# Patient Record
Sex: Female | Born: 1955 | Race: White | Hispanic: No | Marital: Married | Smoking: Never smoker
Health system: Southern US, Community
[De-identification: ages and names within clinical notes are randomized; demographics above are authoritative.]

## PROBLEM LIST (undated history)

## (undated) DIAGNOSIS — D696 Thrombocytopenia, unspecified: Secondary | ICD-10-CM

## (undated) DIAGNOSIS — N898 Other specified noninflammatory disorders of vagina: Secondary | ICD-10-CM

## (undated) DIAGNOSIS — K648 Other hemorrhoids: Secondary | ICD-10-CM

## (undated) DIAGNOSIS — N3281 Overactive bladder: Secondary | ICD-10-CM

## (undated) DIAGNOSIS — B370 Candidal stomatitis: Secondary | ICD-10-CM

## (undated) DIAGNOSIS — R1032 Left lower quadrant pain: Secondary | ICD-10-CM

## (undated) DIAGNOSIS — N760 Acute vaginitis: Secondary | ICD-10-CM

## (undated) DIAGNOSIS — I10 Essential (primary) hypertension: Secondary | ICD-10-CM

## (undated) DIAGNOSIS — G4700 Insomnia, unspecified: Secondary | ICD-10-CM

## (undated) DIAGNOSIS — F419 Anxiety disorder, unspecified: Secondary | ICD-10-CM

## (undated) DIAGNOSIS — E785 Hyperlipidemia, unspecified: Secondary | ICD-10-CM

## (undated) DIAGNOSIS — I639 Cerebral infarction, unspecified: Secondary | ICD-10-CM

## (undated) DIAGNOSIS — J302 Other seasonal allergic rhinitis: Secondary | ICD-10-CM

## (undated) DIAGNOSIS — E039 Hypothyroidism, unspecified: Secondary | ICD-10-CM

## (undated) DIAGNOSIS — I2510 Atherosclerotic heart disease of native coronary artery without angina pectoris: Secondary | ICD-10-CM

## (undated) DIAGNOSIS — B9689 Other specified bacterial agents as the cause of diseases classified elsewhere: Secondary | ICD-10-CM

## (undated) DIAGNOSIS — D369 Benign neoplasm, unspecified site: Secondary | ICD-10-CM

## (undated) DIAGNOSIS — F439 Reaction to severe stress, unspecified: Secondary | ICD-10-CM

## (undated) HISTORY — DX: Other specified noninflammatory disorders of vagina: N89.8

## (undated) HISTORY — PX: ABDOMINAL HYSTERECTOMY: SHX81

## (undated) HISTORY — DX: Insomnia, unspecified: G47.00

## (undated) HISTORY — DX: Acute vaginitis: N76.0

## (undated) HISTORY — PX: INNER EAR SURGERY: SHX679

## (undated) HISTORY — PX: APPENDECTOMY: SHX54

## (undated) HISTORY — DX: Other specified bacterial agents as the cause of diseases classified elsewhere: B96.89

## (undated) HISTORY — DX: Left lower quadrant pain: R10.32

## (undated) HISTORY — DX: Cerebral infarction, unspecified: I63.9

## (undated) HISTORY — DX: Candidal stomatitis: B37.0

## (undated) HISTORY — DX: Benign neoplasm, unspecified site: D36.9

## (undated) HISTORY — DX: Reaction to severe stress, unspecified: F43.9

## (undated) HISTORY — PX: TOTAL ABDOMINAL HYSTERECTOMY W/ BILATERAL SALPINGOOPHORECTOMY: SHX83

---

## 2000-10-13 ENCOUNTER — Ambulatory Visit (HOSPITAL_COMMUNITY): Admission: RE | Admit: 2000-10-13 | Discharge: 2000-10-13 | Payer: Self-pay | Admitting: Obstetrics and Gynecology

## 2000-10-13 ENCOUNTER — Encounter: Payer: Self-pay | Admitting: Obstetrics and Gynecology

## 2001-01-05 ENCOUNTER — Ambulatory Visit (HOSPITAL_COMMUNITY): Admission: RE | Admit: 2001-01-05 | Discharge: 2001-01-05 | Payer: Self-pay | Admitting: Obstetrics and Gynecology

## 2001-01-05 ENCOUNTER — Encounter: Payer: Self-pay | Admitting: Obstetrics and Gynecology

## 2001-02-27 DIAGNOSIS — I2510 Atherosclerotic heart disease of native coronary artery without angina pectoris: Secondary | ICD-10-CM

## 2001-02-27 HISTORY — PX: CARDIAC CATHETERIZATION: SHX172

## 2001-02-27 HISTORY — DX: Atherosclerotic heart disease of native coronary artery without angina pectoris: I25.10

## 2001-03-03 ENCOUNTER — Inpatient Hospital Stay (HOSPITAL_COMMUNITY): Admission: EM | Admit: 2001-03-03 | Discharge: 2001-03-04 | Payer: Self-pay | Admitting: Emergency Medicine

## 2001-03-03 ENCOUNTER — Encounter: Payer: Self-pay | Admitting: Emergency Medicine

## 2001-03-10 ENCOUNTER — Ambulatory Visit (HOSPITAL_COMMUNITY): Admission: RE | Admit: 2001-03-10 | Discharge: 2001-03-10 | Payer: Self-pay | Admitting: Cardiology

## 2001-09-05 ENCOUNTER — Other Ambulatory Visit: Admission: RE | Admit: 2001-09-05 | Discharge: 2001-09-05 | Payer: Self-pay | Admitting: Obstetrics and Gynecology

## 2001-09-09 ENCOUNTER — Ambulatory Visit (HOSPITAL_COMMUNITY): Admission: RE | Admit: 2001-09-09 | Discharge: 2001-09-09 | Payer: Self-pay | Admitting: Obstetrics and Gynecology

## 2001-09-09 ENCOUNTER — Encounter: Payer: Self-pay | Admitting: Obstetrics and Gynecology

## 2001-09-30 ENCOUNTER — Inpatient Hospital Stay (HOSPITAL_COMMUNITY): Admission: RE | Admit: 2001-09-30 | Discharge: 2001-10-02 | Payer: Self-pay | Admitting: Obstetrics and Gynecology

## 2002-08-28 ENCOUNTER — Encounter: Payer: Self-pay | Admitting: Emergency Medicine

## 2002-08-28 ENCOUNTER — Emergency Department (HOSPITAL_COMMUNITY): Admission: EM | Admit: 2002-08-28 | Discharge: 2002-08-28 | Payer: Self-pay | Admitting: Emergency Medicine

## 2002-11-28 HISTORY — PX: LAPAROTOMY: SHX154

## 2002-12-06 ENCOUNTER — Encounter: Payer: Self-pay | Admitting: Obstetrics & Gynecology

## 2002-12-06 ENCOUNTER — Ambulatory Visit (HOSPITAL_COMMUNITY): Admission: RE | Admit: 2002-12-06 | Discharge: 2002-12-06 | Payer: Self-pay | Admitting: Obstetrics & Gynecology

## 2002-12-11 ENCOUNTER — Inpatient Hospital Stay (HOSPITAL_COMMUNITY): Admission: RE | Admit: 2002-12-11 | Discharge: 2002-12-14 | Payer: Self-pay | Admitting: Obstetrics and Gynecology

## 2003-09-28 DIAGNOSIS — K648 Other hemorrhoids: Secondary | ICD-10-CM

## 2003-09-28 HISTORY — DX: Other hemorrhoids: K64.8

## 2003-09-28 HISTORY — PX: COLONOSCOPY: SHX174

## 2003-10-16 ENCOUNTER — Ambulatory Visit (HOSPITAL_COMMUNITY): Admission: RE | Admit: 2003-10-16 | Discharge: 2003-10-16 | Payer: Self-pay | Admitting: Internal Medicine

## 2003-10-19 ENCOUNTER — Ambulatory Visit (HOSPITAL_COMMUNITY): Admission: RE | Admit: 2003-10-19 | Discharge: 2003-10-19 | Payer: Self-pay | Admitting: Internal Medicine

## 2005-10-26 ENCOUNTER — Ambulatory Visit (HOSPITAL_COMMUNITY): Admission: RE | Admit: 2005-10-26 | Discharge: 2005-10-26 | Payer: Self-pay | Admitting: Family Medicine

## 2006-08-30 ENCOUNTER — Ambulatory Visit (HOSPITAL_COMMUNITY): Admission: RE | Admit: 2006-08-30 | Discharge: 2006-08-30 | Payer: Self-pay | Admitting: Obstetrics and Gynecology

## 2006-09-27 ENCOUNTER — Encounter (HOSPITAL_COMMUNITY): Admission: RE | Admit: 2006-09-27 | Discharge: 2006-10-27 | Payer: Self-pay | Admitting: Oncology

## 2006-09-27 ENCOUNTER — Ambulatory Visit (HOSPITAL_COMMUNITY): Payer: Self-pay | Admitting: Oncology

## 2006-10-28 DIAGNOSIS — E039 Hypothyroidism, unspecified: Secondary | ICD-10-CM

## 2006-10-28 HISTORY — DX: Hypothyroidism, unspecified: E03.9

## 2006-10-28 HISTORY — PX: THYROIDECTOMY: SHX17

## 2006-11-09 ENCOUNTER — Encounter (HOSPITAL_COMMUNITY): Admission: RE | Admit: 2006-11-09 | Discharge: 2006-12-09 | Payer: Self-pay | Admitting: Oncology

## 2006-11-24 ENCOUNTER — Encounter (INDEPENDENT_AMBULATORY_CARE_PROVIDER_SITE_OTHER): Payer: Self-pay | Admitting: General Surgery

## 2006-11-24 ENCOUNTER — Observation Stay (HOSPITAL_COMMUNITY): Admission: AD | Admit: 2006-11-24 | Discharge: 2006-11-25 | Payer: Self-pay | Admitting: General Surgery

## 2007-09-02 ENCOUNTER — Ambulatory Visit (HOSPITAL_COMMUNITY): Admission: RE | Admit: 2007-09-02 | Discharge: 2007-09-02 | Payer: Self-pay | Admitting: Family Medicine

## 2007-09-09 ENCOUNTER — Other Ambulatory Visit: Admission: RE | Admit: 2007-09-09 | Discharge: 2007-09-09 | Payer: Self-pay | Admitting: Obstetrics and Gynecology

## 2008-10-03 ENCOUNTER — Ambulatory Visit (HOSPITAL_COMMUNITY): Admission: RE | Admit: 2008-10-03 | Discharge: 2008-10-03 | Payer: Self-pay | Admitting: Obstetrics & Gynecology

## 2009-12-10 ENCOUNTER — Ambulatory Visit (HOSPITAL_COMMUNITY): Payer: Self-pay | Admitting: Oncology

## 2009-12-10 ENCOUNTER — Encounter (HOSPITAL_COMMUNITY): Admission: RE | Admit: 2009-12-10 | Discharge: 2010-01-09 | Payer: Self-pay | Admitting: Oncology

## 2009-12-10 ENCOUNTER — Ambulatory Visit: Payer: Self-pay | Admitting: Oncology

## 2010-02-10 ENCOUNTER — Ambulatory Visit (HOSPITAL_COMMUNITY): Payer: Self-pay | Admitting: Oncology

## 2010-02-10 ENCOUNTER — Encounter (HOSPITAL_COMMUNITY): Admission: RE | Admit: 2010-02-10 | Discharge: 2010-03-12 | Payer: Self-pay | Admitting: Oncology

## 2010-07-20 ENCOUNTER — Encounter: Payer: Self-pay | Admitting: Family Medicine

## 2010-08-11 ENCOUNTER — Other Ambulatory Visit (HOSPITAL_COMMUNITY): Payer: Self-pay

## 2010-09-11 LAB — DIFFERENTIAL
Basophils Absolute: 0.1 10*3/uL (ref 0.0–0.1)
Basophils Relative: 1 % (ref 0–1)
Eosinophils Absolute: 0.2 10*3/uL (ref 0.0–0.7)
Eosinophils Relative: 3 % (ref 0–5)
Lymphocytes Relative: 39 % (ref 12–46)
Lymphs Abs: 3 10*3/uL (ref 0.7–4.0)
Monocytes Absolute: 0.7 10*3/uL (ref 0.1–1.0)
Monocytes Relative: 9 % (ref 3–12)
Neutro Abs: 3.8 10*3/uL (ref 1.7–7.7)
Neutrophils Relative %: 48 % (ref 43–77)

## 2010-09-11 LAB — CBC
HCT: 40.3 % (ref 36.0–46.0)
Hemoglobin: 13.7 g/dL (ref 12.0–15.0)
MCH: 31.5 pg (ref 26.0–34.0)
MCHC: 33.9 g/dL (ref 30.0–36.0)
MCV: 93 fL (ref 78.0–100.0)
RBC: 4.34 MIL/uL (ref 3.87–5.11)
RDW: 12.6 % (ref 11.5–15.5)
WBC: 7.8 10*3/uL (ref 4.0–10.5)

## 2010-09-11 LAB — PLATELET COUNT: Platelets: 166 10*3/uL (ref 150–400)

## 2010-09-15 LAB — CBC
HCT: 40 % (ref 36.0–46.0)
Hemoglobin: 13.9 g/dL (ref 12.0–15.0)
MCHC: 34.8 g/dL (ref 30.0–36.0)
MCV: 91.6 fL (ref 78.0–100.0)
Platelets: 168 10*3/uL (ref 150–400)
RBC: 4.36 MIL/uL (ref 3.87–5.11)
RDW: 12.7 % (ref 11.5–15.5)
WBC: 5.5 10*3/uL (ref 4.0–10.5)

## 2010-09-15 LAB — DIFFERENTIAL
Basophils Absolute: 0 10*3/uL (ref 0.0–0.1)
Basophils Relative: 0 % (ref 0–1)
Eosinophils Absolute: 0.2 10*3/uL (ref 0.0–0.7)
Eosinophils Relative: 4 % (ref 0–5)
Lymphocytes Relative: 32 % (ref 12–46)
Lymphs Abs: 1.7 10*3/uL (ref 0.7–4.0)
Monocytes Absolute: 0.4 10*3/uL (ref 0.1–1.0)
Monocytes Relative: 8 % (ref 3–12)
Neutro Abs: 3.1 10*3/uL (ref 1.7–7.7)
Neutrophils Relative %: 56 % (ref 43–77)

## 2010-09-15 LAB — HEPATITIS B CORE ANTIBODY, IGM: Hep B C IgM: NEGATIVE

## 2010-09-15 LAB — HEPATITIS B SURFACE ANTIBODY,QUALITATIVE: Hep B S Ab: NEGATIVE

## 2010-09-15 LAB — HEPATITIS B SURFACE ANTIGEN: Hepatitis B Surface Ag: NEGATIVE

## 2010-09-15 LAB — HEPATITIS C ANTIBODY: HCV Ab: NEGATIVE

## 2010-09-15 LAB — ANA: Anti Nuclear Antibody(ANA): NEGATIVE

## 2010-11-11 NOTE — H&P (Signed)
NAME:  Kristina Mcdonald, Kristina Mcdonald                ACCOUNT NO.:  [id number]   MEDICAL RECORD NO.:  [id number]          PATIENT TYPE:  AMB   LOCATION:  DAY                           FACILITY:  [facility]   PHYSICIAN:  Dalia Heading, M.D.  DATE OF BIRTH:  Feb 04, 1956   DATE OF ADMISSION:  11/24/2006  DATE OF DISCHARGE:  LH                              HISTORY & PHYSICAL   CHIEF COMPLAINT:  Thyroid neoplasm.   HISTORY OF PRESENT ILLNESS:  The patient is a 55 year old, white female  who is referred for evaluation and treatment of a nodules in the thyroid  gland.  She has been having increased pressure on the throat with  questionable swallowing difficulties.  No voice changes or family  history of thyroid cancer as noted.  There is no history of weight loss  are radiation therapy.  No heart palpitations have been noted.   PAST MEDICAL HISTORY:  Hypertension.   PAST SURGICAL HISTORY:  1. Ear surgery.  2. Hysterectomy.   CURRENT MEDICATIONS:  1. Vesicare 10 mg p.o. daily.  2. Hyzaar 50/12.5 mg p.o. daily.  3. Premarin 1.25 mg p.o. daily.   ALLERGIES:  No known drug allergies.   REVIEW OF SYSTEMS:  The patient denies drinking or smoking.  She denies  any cardiopulmonary difficulties or bleeding disorders except as noted.   PHYSICAL EXAMINATION:  GENERAL:  The patient is a well-developed, well-  nourished, white female in no acute distress.  NECK:  Supple without lymphadenopathy.  A diffusely enlarged thyroid is  noted with the left nodule being larger than the right nodule.  No  tracheal deviation is noted.  LUNGS:  Clear to auscultation with equal breath sounds bilaterally.  HEART:  Regular rate and rhythm without S3, S4 or murmurs.  Ultrasound  of the thyroid in a thyroid scan reveals two nodules in the left lobe of  the thyroid with the left upper pole nodule being cold in nature.  Her  thyroid function tests are within normal limits.   IMPRESSION:  Multiple neoplasms, thyroid gland.   PLAN:   The patient is scheduled for total thyroidectomy on Nov 24, 2006.  Given the diffuse nature of the enlargement, her symptoms and the  possibility of malignancy, a total thyroidectomy is indicated.  The  risks and benefits of the procedure including bleeding, infection, nerve  injury and voice changes were fully explained to the patient gaining  informed consent.      Dalia Heading, M.D.  Electronically Signed     MAJ/MEDQ  D:  11/18/2006  T:  11/19/2006  Job:  [id number]   cc:   [facility]  Fax: [phone]   Ladona Horns. Mariel Sleet, MD  Fax: [phone]   Patrica Duel, M.D.  Fax: [phone]

## 2010-11-11 NOTE — Op Note (Signed)
NAME:  Kristina Mcdonald, Kristina Mcdonald                ACCOUNT NO.:  [id number]   MEDICAL RECORD NO.:  [id number]          PATIENT TYPE:  OBV   LOCATION:  IC04                          FACILITY:  [facility]   PHYSICIAN:  Dalia Heading, M.D.  DATE OF BIRTH:  1956/06/21   DATE OF PROCEDURE:  11/24/2006  DATE OF DISCHARGE:                               OPERATIVE REPORT   PREOPERATIVE DIAGNOSIS:  Thyroid neoplasm.   POSTOPERATIVE DIAGNOSIS:  Thyroid neoplasm.   PROCEDURE:  Total thyroidectomy.   SURGEON:  Dalia Heading, M.D.   ANESTHESIA:  General endotracheal.   INDICATIONS:  The patient is a 55 year old white female who presents  with a large cold nodule in the left upper thyroid lobe and also a  smaller nodule in the lower pole of the left lobe of the thyroid.  Her  right lobe was also noted to be slightly enlarged.  She does have some  pressure sensation with the enlargement of her thyroid gland.  Given the  possibility of malignancy, and her symptomatic nature, it is elected to  proceed with a total thyroidectomy.  The risks and benefits of the  procedure including bleeding, infection, the possibility of malignancy,  the possibility of nerve injury, were fully explained to the patient who  gave informed consent.   PROCEDURE NOTE:  The patient was placed in the supine position.  Her  neck was extended after induction of general endotracheal anesthesia.  Surgical site confirmation was performed.   A transverse incision was made in a skin crease just above the jugular  notch.  Her platysma was divided.  The strap muscles were divided  longitudinally.  The left lobe of the thyroid was first mobilized.  A  large nodule, which measured approximately 2.5 to 3 cm in its greatest  diameter, was found.  The ligament of Allyson Sabal was ligated and divided.  The middle thyroidal vein was ligated and divided.  The inferior  thyroidal artery was ligated and divided.  The left lobe of the thyroid  was then bluntly  brought over medially towards the trachea.  Care was  taken to avoid the recurrent laryngeal nerve.  The right lobe of the  thyroid was, likewise, freed away.  No obvious masses were noted.  The  suspensory ligament of Berry, middle thyroidal vein, and inferior  thyroidal artery and veins were all ligated and divided using clips.  Care was taken to avoid the right recurrent laryngeal nerve.  The right  lobe of the thyroid was excised and sent to pathology for further  examination.  The left lobe along with the isthmus was sent to pathology  for further examination.   Any bleeding was controlled using clips and Surgicel.  A small #5 drain  was placed into the thyroid bed and brought out through a separate stab  wound inferior to the incision line.  The strap muscles were  reapproximated using a running 3-0 Vicryl suture.  The platysma was  reapproximated using a 3-0 Vicryl running suture.  This skin was closed  using a 4-0 Vicryl subcuticular suture.  0.5% Sensorcaine  was instilled  in the surrounding wound.  Dermabond was then applied.  All tape and  needle counts were correct at the end of the procedure.  The patient was  extubated in the operating room and went back to the recovery room awake  in stable condition.   COMPLICATIONS:  None.   SPECIMEN:  Left lobe of thyroid and isthmus, right thyroid.   DRAINS:  Small Hemovac to thyroidal bed.   ESTIMATED BLOOD LOSS:  Less than 50 mL.      Dalia Heading, M.D.  Electronically Signed     MAJ/MEDQ  D:  11/24/2006  T:  11/24/2006  Job:  [id number]   cc:   Ladona Horns. Mariel Sleet, MD  Fax: [phone]   Patrica Duel, M.D.  Fax: [phone]

## 2010-11-14 NOTE — Op Note (Signed)
NAME:  Kristina Mcdonald, Kristina Mcdonald                          ACCOUNT NO.:  [id number]   MEDICAL RECORD NO.:  [id number]                   PATIENT TYPE:  AMB   LOCATION:  DAY                                  FACILITY:  [facility]   PHYSICIAN:  Tilda Burrow, M.D.              DATE OF BIRTH:  1956/01/12   DATE OF PROCEDURE:  12/11/2002  DATE OF DISCHARGE:                                 OPERATIVE REPORT   PREOPERATIVE DIAGNOSIS:  Left lower quadrant mass, left lower quadrant pain.   POSTOPERATIVE DIAGNOSIS:  Left ovarian remnant, extensive pelvic adhesions.   OPERATION PERFORMED:  Laparotomy, excision of cicatrix, excision of ovarian  remnant, lysis of adhesions, incidental appendectomy.   SURGEON:  Tilda Burrow, M.D.   ASSISTANT:  Trinna Post Ward, RN   ANESTHESIA:  General.   COMPLICATIONS:  None.   INTRAOPERATIVE CONSULTATION:  Dr. Lovell Sheehan.   DRAINS:  None.   ESTIMATED BLOOD LOSS:  .   OPERATIVE FINDINGS:  Extensive filmy adhesive disease involving omentum,  small bowel and sigmoid all the way to the bladder dome completely obscuring  cystic left ovarian mass.   DESCRIPTION OF PROCEDURE:  The patient was taken to the operating room and  prepped and draped for lower abdominal surgery with midline vertical  incision performed.  No skin edge was trimmed but subcu fatty tissues,  scarring was trimmed.  Midline incision was opened from the umbilicus to the  suprapubic area.  Omentum was encountered attached by thin filmy adhesions  completely to the anterior abdominal wall.  These were gradually slowly  dissected free liberating the omentum from its attachments all the way down  to the bladder dome.  We then proceeded to inspect the bowel.  The small  bowel was extensively adherent to itself with interconnections that were  thin, filmy and mostly responsive index finger dissection.  Some required  sharp dissection and responded nicely.  At no time was bowel injury  suspected.  As we  mobilized the small bowel gradually and got it out of the  pelvis, it was apparent that the sigmoid was equally densely adherent.  We  started in the lateral pelvic gutter and sharply and bluntly dissected down  to the top of the large firm 5 to 6 cm hard fixed cystic mass which was  lateral to the sigmoid.  Position indicated likely ovarian functional  tissue.  We proceeded with dissection and due to the increasing density of  the adhesions, we asked Dr. Lovell Sheehan to step in to assist with consultation  and surgical dissection around the sigmoid.  We then proceeded with a  combination of sharp and blunt dissection mobilizing the sigmoid off of the  bladder dome and off of the left lower quadrant.  We could then identify the  full length of the cyst.  The cyst was ruptured and we were able to then  peel the cyst off  the lateral side wall.  There was some nonspecific tissue  left on the left side and immediately adjacent to a clip that was there from  the prior surgery, we were able to pull off a few remnants of ovarian  tissue.  We left the side wall relatively clean.  Retroperitoneal dissection  was not necessary.  We inspected the area and decided that there was  insufficient benefit to consider further operative efforts.  The dry tapes  were placed in the defect and after a few minutes waiting, inspected and  hemostasis was considered adequate.  There was  tendency towards slight  diffuse oozing throughout the abdominal cavity from the multiple thin, filmy  adhesions that had been trimmed.  We went back to looking at the small bowel  and further mobilized the small bowel loops from themselves, particularly  where any possible kinking was suspected.  Loose attachments that were  considered safely positioned were left intact.  Inspection of the appendix  revealed that it was involved in sufficient adhesions that after removal of  adhesions, we decided appendectomy was necessary and we  proceeded with  appendectomy by hemostat cross-clamping at the mesoappendix in two bites and  then hemostat cross-clamping at the appendiceal base.  2-0 chromic ligatures  were placed around each of these pedicles.  Specimen was sent for histology.  Irrigation of the pelvis was again performed, hemostasis considered  adequate.  Anterior peritoneum was closed using 0 Maxon and then the  subcutaneous tissues were reapproximated with 2-0 chromic.  The estimated  blood loss was 300 to .                                                Tilda Burrow, M.D.    JVF/MEDQ  D:  12/11/2002  T:  12/11/2002  Job:  [id number]

## 2010-11-14 NOTE — Discharge Summary (Signed)
NAME:  Kristina Mcdonald, Kristina Mcdonald                          ACCOUNT NO.:  [id number]   MEDICAL RECORD NO.:  [id number]                   PATIENT TYPE:  INP   LOCATION:  A418                                 FACILITY:  [facility]   PHYSICIAN:  Tilda Burrow, M.D.              DATE OF BIRTH:  [birth year]   DATE OF ADMISSION:  12/11/2002  DATE OF DISCHARGE:  12/14/2002                                 DISCHARGE SUMMARY   ADMISSION DIAGNOSES:  1. Left lower quadrant abdominal pain, progressive in symptoms.  2. Left cystic pelvic mass.   DIAGNOSES:  1. Left lower quadrant pain.  2. Left lower quadrant cystic mass secondary to left ovarian remaneant.  3. Extensive pelvic adhesions.   PROCEDURES:  1. Laparotomy.  2. Excision of cicatrix.  3. Excision of a ovarian remaneant.  4. Lysis of adhesions.  5. Incidental appendectomy.   DISCHARGE MEDICATIONS:  1. Tylox 30 tablets one q.4h. p.r.n. pain.  2. Premarin 0.625 mg one p.o. every day.   HISTORY OF PRESENT ILLNESS:  This 55 year old female, one year status post  removal of symptomatic left ovarian cyst by laparotomy after failed  laparoscopy, is admitted for incapacitating severe left lower quadrant pain  radiating into the groin.  She can not progress farther.  CT scan in March  showed a 3 x 4-cm left lower quadrant cyst, a peritoneal inclusion cyst was  considered likely.  She was managed for a while as an outpatient and  requests surgery at this time.   PAST MEDICAL HISTORY:  1. Hypercholesterolemia.  2. Low-back pain since April 2003 with MRI showing a paracentral disk     herniation of L1-L2.   PAST SURGICAL HISTORY:  1. Abdominal hysterectomy, 1986.  2. Right tympanoplasty in 1980.  3. Laparoscopy converted to laparotomy due to adhesions with subsequent     bilateral salpingo-oophorectomy in 2003.   HOSPITAL COURSE:  The patient was admitted, underwent laparotomy through a  midline incision with excision of cicatrix, with surgery  described in the  operative note.  Findings included extensive filmy adhesive disease  involving the omentum, small bowel and sigmoid and the bladder dome  completely obscuring a cystic, left mass which was suspected to be an  ovarian remaneant.  Pathology report showed cystic tissues consistent with  left ovarian tissue.  The appendix was removed incidentally.  Cicatrix was  excised from the midline.  Postoperatively the patient was afebrile.  Had a  postoperative hemoglobin 11.8, hematocrit 33.7, compared to 12.9 and 37.1  pre-op.  She was given Premarin 1.25 orally during postop care, that she had  been on before.  She had a low-grade temperature to 100.2 one day but was  afebrile the rest of hospital course.  She was considered stable for  discharge on December 14, 2002 on analgesics and Premarin with pathology showing  corpus luteum cyst, residual ovarian tissue.  She was tolerating  a regular  diet.  Stable for discharge.   FOLLOWUP:  One week for staple removal.   DISCHARGE INSTRUCTIONS:  Subsequent postop instructions given.                                               Tilda Burrow, M.D.    JVF/MEDQ  D:  01/23/2003  T:  01/23/2003  Job:  [id number]

## 2010-11-14 NOTE — H&P (Signed)
NAME:  Kristina Mcdonald, Kristina Mcdonald                          ACCOUNT NO.:  [id number]   MEDICAL RECORD NO.:  [id number]                   PATIENT TYPE:  AMB   LOCATION:  DAY                                  FACILITY:  [facility]   PHYSICIAN:  Tilda Burrow, M.D.              DATE OF BIRTH:  1955/12/09   DATE OF ADMISSION:  DATE OF DISCHARGE:                                HISTORY & PHYSICAL   ADMISSION DIAGNOSES:  1. Left lower quadrant abdominal pain progressive in symptoms.  2. Left cystic mass.   HISTORY OF PRESENT ILLNESS:  This 55 year old female, one year status post  removal of a symptomatic left ovarian cyst by laparotomy after failed effort  at laparoscopy, is seen back for development of incapacitating severe left  lower quadrant pain radiating into the groin.  This pain becomes sufficient  that she cannot progress further.  She has had an unremarkable postoperative  course until recently.  She had an episode of constipation in August, and  other then that was fine until presenting in March with severe left lower  quadrant pain.  CT scan done in March showed a 3 x 4 cm left lower quadrant  cyst.  In light of the prior surgery, a peritoneal inclusion cyst was  considered most likely.  It was managed on Toradol and Tylox.  She got  better.  She has had a one month history of severe worsening.  She has had  no fever, no chills.  CT scan has been repeated and shows progression of the  cyst now measuring 5 cm in diameter.  The pain is worse when she stands up  and radiates downward into the groin.  There is no evidence of hernia on  careful evaluation of the groin.  Pain is insufficiency, intervention is  necessary.   REVIEW OF SYSTEMS:  Also notable for a brief one week history of some pain  the back area which seems to be separate.  There was no hydroureter on CT  scan.   Ms. Prout is fully aware that infusions are likely to be encountered, and  that a peritoneal inclusion cyst are one  possibility residual ovarian  tissue, the other, and that the possibility of recurrent adhesions after  this surgery is quite likely.  Due to the patient's somewhat lax lower  abdomen and prior surgical scars, we are planning to do a partial  panniculectomy to improve surgical access to the pelvis.  The patient is  aware of the potential for infection is increased with the size of her  abdominal incision, and accepts that risk.   PAST MEDICAL HISTORY:  1. Hypercholesterolemia.  2. Low back pain noted as far back as April 2003, with MRI showing     paracentral disk herniation at L1-2.   PAST SURGICAL HISTORY:  1. Abdominal hysterectomy without bilateral salpingo-oophorectomy in 1986.  2. Right tympanoplasty in 1980's.  3. Laparoscopy converted to laparotomy and bilateral salpingo-oophorectomy     in 2003.   PHYSICAL EXAMINATION:  VITAL SIGNS:  Height 5 feet, weight 192.  GENERAL:  A healthy obese Caucasian female in obvious discomfort worsened by  position changes.  HEENT:  Pupils equal, round, reactive.  Extraocular movements were intact.  NECK:  Supple, trachea midline.  CHEST:  Clear to auscultation.  ABDOMEN:  Tenderness in the left lower quadrant.  EXTREMITIES:  Negative straight leg raise.  GENITOURINARY:  External genitalia normal for age.  PELVIC:  Good support, no cystocele or rectocele, cervix was absent.  Adnexa  negative for masses.   IMPRESSION:  Left lower quadrant pain and cystic mass consistent with intra-  abdominal adhesions.  Possible functional residual ovarian tissue.   PLAN:  Laparotomy, lysis of adhesions with partial panniculectomy on December 11, 2002.                                               Tilda Burrow, M.D.    JVF/MEDQ  D:  12/10/2002  T:  12/10/2002  Job:  [id number]   cc:   [facility]

## 2010-11-14 NOTE — H&P (Signed)
[facility]  Patient:    RICCI, PAFF Visit Number: [id number] MRN: [id number]          Service Type: SUR Location: 4A A417 01 Attending Physician:  Tilda Burrow Dictated by:   Christin Bach, M.D. Admit Date:  09/30/2001 Discharge Date: 10/02/2001   CC:         [facility]   History and Physical  DATE OF BIRTH:  10-03-1955  ADMITTING DIAGNOSIS:  Cystic left ovary, asymptomatic, right lower quadrant discomfort.  HISTORY OF PRESENT ILLNESS:  This is a 55 year old female who is status post hysterectomy years ago for benign disease.  Is admitted at this time for evaluation of right lower quadrant discomfort.  Had several months discomfort which has been evaluated by Dr. Patrica Duel, [facility] with analgesics and radiologic evaluation.  During the course of evaluation, she was found to have CT of the pelvis and abdomen.  The abdomen was essentially unremarkable.  There was some mention of some note of the presence of lymph nodes, all small, 1 cm in range.  There was no ascites.  There was noted on ultrasound on the CT of the pelvis to be a cystic mass in the area of the left ovary.  There was a relatively unremarkable right ovary.  There was no inguinal adenopathy, or pelvic adenopathy.  She has been seen in our office in consultation where exam shows an entirely benign exam with a CA-125 of 15.6 (normal 0-30.2).  There is no ascites.  I have reviewed the CT with Ulyses Southward, M.D. and he considers the lymphadenopathy and the lymph nodes noted on CT of the abdomen to be within normal limits.  The patient is being operated on for diagnosis of the etiology of the ovarian mass.  Plans are to initiate the procedure laparoscopically.  The patient is fully aware that laparotomy may be necessary or mini laparotomy to extract the mass, even in the case of a successful laparoscopic approach.  The absence of symptoms,  the absence of palpable mass on Gyn exam warrants laparoscopic evaluation initially.  The patient is aware that should frozen section suggest malignancy, that further surgical procedure will be required and would involve laparotomy with removal of the omentum and other biopsies as indicated for malignancy.  PAST MEDICAL HISTORY:  Positive for hypercholesterolemia.  Medical history also positive for low back pain with an MRI of the back suggesting paracentral disk herniation at L1 and L2 which may likely explain the discomfort.  We will make efforts to ensure that the patient is comfortable during the procedure.  PAST SURGICAL HISTORY:  Hysterectomy.  SOCIAL HISTORY:  Married with a stable relationship.  PHYSICAL EXAMINATION:  VITAL SIGNS:  Height 5 feet.  Weight 192.  Blood pressure 126/80.  GENERAL:  She is a healthy, moderately obese Caucasian female, alert and oriented x3.  HEENT:  Pupils equal, round, and reactive.  NECK:  Supple.  Mouth healthy.  Trachea midline.  Thyroid palpated normal.  BREASTS:  Exam without masses, dimpling, or discharge.  LUNGS:  Clear to auscultation.  HEART:  Regular rate and rhythm.  ABDOMEN:  Flat and nontender.  MUSCULOSKELETAL:  Normal.  No range of motion restrictions noted.  EXTREMITIES:  Without varicosities.  Deep tendon reflexes symmetric, 2+ bilaterally.  GENITALIA:  External genitalia normal.  Premenopausal female.  Estrogen effect noted on vaginal exam.  Cervix and uterus are absent.  Vaginal cuff is well-supported high inside the pelvis.  There has been some right lower quadrant discomfort noted on one exam, but not reproducible on my most recent preoperative evaluation.  PLAN:  Diagnostic laparoscopy with laparoscopic bilateral salpingo-oophorectomy.  Plan possible laparotomy and further surgery depending on frozen section. Dictated by:   Christin Bach, M.D. Attending Physician:  Tilda Burrow DD:  09/30/01 TD:   09/30/01 Job: [id number]

## 2010-11-14 NOTE — Op Note (Signed)
NAME:  Kristina Mcdonald, Kristina Mcdonald                          ACCOUNT NO.:  [id number]   MEDICAL RECORD NO.:  [id number]                   PATIENT TYPE:  AMB   LOCATION:  DAY                                  FACILITY:  [facility]   PHYSICIAN:  R. Roetta Sessions, M.D.              DATE OF BIRTH:  31-Jan-1956   DATE OF PROCEDURE:  10/16/2003  DATE OF DISCHARGE:                                 OPERATIVE REPORT   PROCEDURE:  Colonoscopy and ileoscopy.   ENDOSCOPIST:  Gerrit Friends. Rourk, M.D.   INDICATIONS FOR PROCEDURE:  The patient is a 55 year old lady with left-  sided abdominal pain and intermittent hematochezia of 4 months' duration.  A  few weeks ago she had an episode of what she describes as a large-volume,  painless hematochezia although she has had some intermittent waxing-and  waning, left-sided, abdominal pain.  Amylase, lipase and LFTs and CBC were  all normal.  Sedimentation rate was minimally elevated to 26 through our  office last week.  Colonoscopy is now being done.  This approach has been  discussed with the patient at length.  The potential risks, benefits, and  alternatives have been reviewed.  She has a history of partial left  oophorectomy and had a hemorrhagic appearing cyst on CT last year when she  was seen by Dr. Despina Hidden.   PROCEDURE NOTE:  O2 saturation, blood pressure, pulse and respirations were  monitored throughout the entire procedure.  Conscious sedation: Versed 2 mg IV, Demerol 50 mg IV in a single dose.   INSTRUMENT:  Olympus CF-140 colonoscope.   FINDINGS:  Digital rectal exam revealed no abnormalities.   ENDOSCOPIC FINDINGS:  The prep was good.   RECTUM:  Examination of the rectal mucosa including the retroflex view of  the anal verge revealed no abnormalities aside from minimal internal  hemorrhoids.   COLON:  The colonic mucosa was surveyed from the rectosigmoid junction  through the left transverse and right colon to the area of the appendiceal  orifice, ileocecal  valve, and cecum.  These structures were well seen and  photographed for the record.  The terminal ileum was intubated to 10 cm.   From this level the scope was slowly withdrawn.  All previously mentioned  mucosal surfaces were again seen.  The colonic mucosa appeared entirely  normal.  The distal 10 cm of the ileum appeared normal.  The patient  tolerated the procedure well and was reacted in endoscopy.   IMPRESSION:  1. Internal hemorrhoids otherwise normal rectum.  2. Normal colon.  3. Normal terminal ileum.   DISCUSSION:  Today's findings are reassuring.  Rectal bleeding is most  likely of low volume and relatively trivial given normality of her H&H.  Left-sided abdominal pain not explained based on today's findings.   RECOMMENDATIONS:  1. Double check her H&H today.  2. Proceed with abdominopelvic CT scan to further evaluate her symptoms.  3. Further recommendations to follow.      ___________________________________________                                            Jonathon Bellows, M.D.   RMR/MEDQ  D:  10/16/2003  T:  10/17/2003  Job:  [id number]   cc:   Lazaro Arms, M.D.  [address]  [place]  [place] [address]  Fax: [phone]   R. Roetta Sessions, M.D.  [address]  [place]  [place] [address]  Fax: [phone]

## 2010-11-14 NOTE — Cardiovascular Report (Signed)
[facility]  Patient:    Kristina Mcdonald, Kristina Mcdonald Visit Number: [id number] MRN: [id number]          Service Type: CAT Location: [facility] 2899 30 Attending Physician:  Jonelle Sidle Dictated by:   Jonelle Sidle, M.D. Proc. Date: 03/10/01 Admit Date:  03/10/2001                          Cardiac Catheterization  REFERRING PHYSICIAN: Artis Delay, M.D.  PRIMARY [place] CARDIOLOGIST: Luis Abed, M.D.  INDICATIONS: The patient is a 55 year old woman, with a history of hypertension, who recently underwent a myocardial perfusion study given progressive chest discomfort.  This study revealed possible ischemia in the anterior apical distribution with a normal left ventricular ejection fraction. The patient is referred for cardiac catheterization to define her coronary anatomy.  PROCEDURES PERFORMED: 1. Left heart catheterization. 2. Selective coronary angiography. 3. Left ventriculography.  DESCRIPTION OF PROCEDURE: After informed consent was obtained, the patient as taken to the cardiac catheterization lab where she was prepped and draped in the usual sterile fashion. The  area about the right femoral artery was anesthetized with 1% lidocaine, and a 6 French sheath was placed in the right femoral artery via the modified Seldinger technique.  Selective coronary angiography was performed in multiple views using JL4, JR4, No Torque right, and a 5 [place], FR4 catheter.  The right coronary artery was small and damped easily on engagement.  Left ventriculogram was performed using a angled pigtail catheter.  The patient tolerated the procedure well and there were no obvious complications.  HEMODYNAMICS: Left ventricle 130/12 (postangiography).  Aorta 130/72 mmHg.  ANGIOGRAPHIC FINDINGS: 1. The left main is a short vessel with no flow-limiting coronary artery    disease. There is approximately 10-20% distal stenosis noted. 2. The left anterior  descending is a small caliber vessel which provides    essentially three small diagonal branches.  There is no flow-limiting    coronary artery disease noted. 3. The circumflex coronary artery is a small to medium caliber vessel that    provides three obtuse marginal branches.  There is no significant    flow-limiting coronary artery disease noted.  There is approximately    10-20% stenosis in the proximal vessel. 4. The right coronary artery is a small caliber vessel that damped easily    on engagement.  Initially, an 80% proximal stenosis was noted, which    was felt potentially to be due to spasm.  Following injection of 150 mcg    of intracoronary nitroglycerin, this completely resolved and the vessel    appeared normal throughout its course.  LEFT VENTRICULOGRAM: Left ventriculography reveals normal left ventricular contraction with an estimated ejection fraction of 65% and no focal wall motion abnormalities.  No significant mitral regurgitation is noted.  DIAGNOSES: 1. No flow-limiting coronary artery disease noted. Transient spasm of the    proximal right coronary artery which resolved with intracoronary    nitroglycerin. 2. Normal left ventricular contraction with an estimated ejection fraction of    65%.  No significant mitral regurgitation noted.  RECOMMENDATIONS: Would continue aggressive risk factor reduction.  Will also suggest and empiric trial of a proton pump inhibitor for possible gastroesophageal reflux disease. Dictated by:   Jonelle Sidle, M.D. Attending Physician:  Jonelle Sidle DD:  03/10/01 TD:  03/10/01 Job: [id number]

## 2010-11-14 NOTE — Op Note (Signed)
[facility]  Patient:    Kristina Mcdonald, Kristina Mcdonald Visit Number: [id number] MRN: [id number]          Service Type: SUR Location: 4A A417 01 Attending Physician:  Tilda Burrow Dictated by:   Christin Bach, M.D. Proc. Date: 09/30/01 Admit Date:  09/30/2001 Discharge Date: 10/02/2001                             Operative Report  PREOPERATIVE DIAGNOSIS:  Symptomatic left ovarian cyst.  POSTOPERATIVE DIAGNOSIS:  Pelvic adhesions.  PROCEDURE: 1. Diagnostic laparoscopy. 2. Laparotomy. 3. Bilateral salpingo-oophorectomy.  SURGEON:  Christin Bach, M.D.  ASSISTANT:  _____  ANESTHESIA:  General.  COMPLICATIONS:  None.  ESTIMATED BLOOD LOSS:  250 cc.  FINDINGS:  Pelvic adhesions.  DESCRIPTION OF PROCEDURE:  The patient was taken to the operating room, prepped and draped in the for the combined abdominal and vaginal procedure with leg supported in yellow fin leg supports with Foley catheter in place. Single-tooth tenaculum is attached to the vaginal apex for orientation and vaginal pelvic floor manipulation.  The patient then had an infraumbilical vertical 1 cm skin incision performed as well as a transverse suprapubic incision of 1 cm length.  Veress needle was used to achieve pneumoperitoneum under low pressures, using water droplet technique to confirm intraperitoneal positioning.  The patient then had introduction of laparoscopic trocar through the umbilical site using laparoscopic equipment and attention directed to the pelvis.  Suprapubic trocar was placed under direct visualization. Manipulation of the lower abdominal structures revealed dense adhesions, particularly involving the sigmoid colon, sufficient that the pelvic structures could not be adequately visualized.  It was decided that laparotomy was necessary.  We then proceeded with repositioning the legs downward slightly and Pfannenstiel incision was performed.  The Balfour retractor  was positioned and the bowel adhesions progressively dissected free, allowing improved mobility of pelvic structures, particularly the large intestine and visualization of adnexal structures.  The left tube and ovary had been completely obscured by _____  adhesions.  These were gradually dissected free such that the tube and ovary could be identified.  The left adnexa could be identified, placed on countertraction with Babcock clamps and dissected sufficiently free from the sidewall that the retroperitoneum could be identified.  Retroperitoneal approach to identification of the ureter was used.  The infundibulopelvic ligament was then isolated, clamped, cut and suture ligated, being sure that the ureter was safe.   The tube and ovary were then shelled off the sidewall and hemostasis was viewed as adequate.  The opposite side was slightly easier but similar technique was used to carefully remove tube and ovary.  Pelvis was irrigated, confirmed as hemostatic, the laparotomy equipment removed, peritoneum closed with 2-0 chromic, the fascia closed with 0 Vicryl, subcutaneous and fatty tissues reapproximated with 2-0 plain suture and staple closure of the skin completed procedure.  The patient tolerated the procedure well with 200 cc estimated blood loss due to diffuse tendency toward oozing during the bowel adhesions dissection. Dictated by:   Christin Bach, M.D. Attending Physician:  Tilda Burrow DD:  10/19/01 TD:  10/19/01 Job: [id number]

## 2010-11-14 NOTE — Discharge Summary (Signed)
[facility]  Patient:    Kristina Mcdonald, Kristina Mcdonald Visit Number: [id number] MRN: [id number]          Service Type: SUR Location: 4A A417 01 Attending Physician:  Tilda Burrow Dictated by:   Duane Lope, M.D. Admit Date:  09/30/2001 Discharge Date: 10/02/2001                             Discharge Summary  DISCHARGE DIAGNOSES: 1. Status post a bilateral salpingo-oophorectomy. 2. Extensive pelvic adhesions. 3. Unremarkable post operative course.  PROCEDURE:  Laparotomy with bilateral salpingo-oophorectomy after a diagnostic laparoscopy.  HISTORY OF PRESENT ILLNESS:  Please refer to the transcribed History and Physical and Op Notes for details of admission to the hospital.  HOSPITAL COURSE:  The patient was admitted postoperatively. Her surgery went well. There were no complications. She quickly tolerated liquids and a regular diet, voided without symptoms, ambulated without symptoms, had flatus and a bowel movement prior to discharge. Her incision remained clean, dry, and intact, and she had no postop complications. She was discharged to home in the morning of postop day #2 in good stable condition, to follow up in the office on Friday to have her staples removed and examined. She is given instructions and precautions for her term prior to that time. Dictated by:   Duane Lope, M.D. Attending Physician:  Tilda Burrow DD:  10/02/01 TD:  10/02/01 Job: [id number]

## 2010-11-14 NOTE — Consult Note (Signed)
NAME:  Kristina Mcdonald, Kristina Mcdonald NO.:  [id number]   MEDICAL RECORD NO.:  [id number]                  PATIENT TYPE:   LOCATION:                                       FACILITY:   PHYSICIAN:  R. Roetta Sessions, M.D.              DATE OF BIRTH:  25-Jan-1956   DATE OF CONSULTATION:  DATE OF DISCHARGE:                                   CONSULTATION   REASON FOR CONSULTATION:  Rectal bleeding.   HISTORY OF PRESENT ILLNESS:  Kristina Mcdonald is a 55 year old Caucasian female  who presents to our office for consult regarding an episode last week of  large amounts of bright red rectal bleeding.  Kristina Mcdonald notes this is not  associated post defecation.  Bleeding was noted prior to defecation.  Kristina Mcdonald  also has constant aching, left upper quadrant abdominal pain which Kristina Mcdonald rates  2/10.  This has been present for the last couple of years.  Kristina Mcdonald also  complains of fecal incontinence for the last couple of months.  Kristina Mcdonald does  report history of two abdominal surgeries and initial hysterectomy by Dr.  Despina Hidden, which was later followed by removal of left ovarian remnant per  patient report.  Kristina Mcdonald does report occasional use of saline laxatives,  approximately once a month.  Kristina Mcdonald does have intermittent constipation with  bowel movements up to every three days alternating with loose stools as  well.  As far as upper GI concerns, Kristina Mcdonald does have history of dyspepsia,  heartburn, indigestion and typical GERD symptoms for the last couple of  months.  Kristina Mcdonald denies any dysphagia or odynophagia.  Kristina Mcdonald does report frequent  regurgitation.  Kristina Mcdonald denies any family history of colorectal carcinoma.   PAST MEDICAL HISTORY:  1. Cervical polyps.  2. Hypertension.  3. Overactive bladder.   PAST SURGICAL HISTORY:  1. Complete hysterectomy followed by follow up surgery for removal of left     ovarian fragment.  2. Hand surgery.  3. Right ear surgery.   CURRENT MEDICATIONS:  1. Premarin 1.25 mg daily.  2. Ditropan 10  mg daily.  3. Multivitamin daily.  4. Bisoprolol/hydrochlorothiazide 5/6.25 mg daily.  5. Aspirin 81 mg daily.   ALLERGIES:  No known drug allergies.  Kristina Mcdonald did have a reaction to hepatitis B  vaccine.   FAMILY HISTORY:  Negative for colorectal carcinoma, liver or chronic GI  problems.  Mother and father deceased at age 53 and 25, respectively.  There  is significant family history for coronary artery disease.  Kristina Mcdonald has five  sisters and two brothers who are alive and well.   SOCIAL HISTORY:  Kristina Mcdonald has been married for 33 years.  Kristina Mcdonald is employed  full time with [employer].  Kristina Mcdonald reports remote tobacco use.  Denies  any alcohol or drug use.   REVIEW OF SYSTEMS:  CONSTITUTIONAL:  Weight is steadily increasing 20 pounds  in the last  couple of months.  Kristina Mcdonald is complaining of hot flashes, but denies  any fever or chills.  Appetite is good.  CARDIOVASCULAR:  Does have  occasional palpitations with anxiety.  Denies any associated shortness of  breath or dyspnea. GI:  See HPI.   PHYSICAL EXAMINATION:  VITAL SIGNS:  Weight 209 pounds, height 60 inches,  temperature 98.1, blood pressure 132/80, pulse 78.  GENERAL APPEARANCE:  Kristina Mcdonald is a 55 year old, overweight Caucasian  female in no acute distress.  Kristina Mcdonald is alert, oriented, pleasant and  cooperative.  HEENT:  Sclerae are clear, nonicteric.  Conjunctivae are pink.  Oropharynx  pink and moist without any lesions.  NECK:  Supple without any mass or thyromegaly.  CHEST:  Heart rate regular rate and rhythm with normal S1, S2, without any  murmurs, clicks, rubs or gallops.  LUNGS:  Clear to auscultation bilaterally.  ABDOMEN:  Positive bowel sounds x4, no bruits, soft, nontender,  nondistended.  Kristina Mcdonald does have a palpable fullness in the left upper quadrant  without any discrete mass.  No palpable adenopathy.  No rebound tenderness  or guarding.  Negative Murphy's sign.  RECTAL:  An inflamed external hemorrhoid at 6 o'clock which is  not actively  bleeding or thrombosed.  Kristina Mcdonald does have poor sphincter tone, and there is  palpable, what I believe to be internal hemorrhoids.  No discrete masses  palpated.  Guaiac deferred secondary to gross blood on exam per Dr. Forestine Chute  note.  EXTREMITIES:  Good pulses bilaterally.  No edema.  SKIN:  Pink, warm and dry without any rash or jaundice.  Kristina Mcdonald does have  hypopigmented areas from prior drug reaction to the right cheek and chin.   LABORATORY STUDIES:  CT scan was obtained in June 2004, prior to  laparoscopic surgery which revealed a left lower quadrant cystic lesion  measuring 4.9x4.9 cm with a possible hemorrhagic component.  The lesion was  immediately inferior and posterior to the clips in the region of the left  adnexa.  Kristina Mcdonald has not had repeat CT scan since surgery.   ASSESSMENT:  Kristina Mcdonald is a 55 year old Caucasian female with large amount  of bright red rectal bleeding approximately one week ago.  Kristina Mcdonald also notes  some persistent left upper quadrant abdominal pain which is chronic in  nature, at least for the last year or two.  Given her report and physical  examination, it is possible that Kristina Mcdonald may have had bleeding from benign aorta  rectal source including external hemorrhoid on exam.  It is also possible  that Kristina Mcdonald may have had diverticular bleed or ischemia as well.  Further  evaluation is necessary with colonoscopy.   RECOMMENDATIONS:  1. Will set Kristina Mcdonald up for colonoscopy with Dr.  Jena Gauss in the near     future.  I have discussed this procedure including risks and benefits     which include but are not limited to bleeding, infection, perforation or     drug reaction.  Kristina Mcdonald agrees with this plan.  Consent will be obtained.  We     will ask her to hold her aspirin 3 days prior to the procedure.  2. Will check labs to date including CBC, LFT and SED rate.  3. Further recommendations pending procedure.  We would like to thank Dr. Despina Hidden for allowing [place] to participate  in the care  of Kristina Mcdonald.     ________________________________________  ___________________________________________  Nicholas Lose, N.P.  Jonathon Bellows, M.D.   KC/MEDQ  D:  10/10/2003  T:  10/10/2003  Job:  [id number]   cc:   Lazaro Arms, M.D.  [address], [address]  [place]  [place] [address]  Fax: [phone]   R. Roetta Sessions, M.D.  [address]  [place]  [place] [address]  Fax: [phone]

## 2011-01-14 ENCOUNTER — Other Ambulatory Visit: Payer: Self-pay | Admitting: Obstetrics and Gynecology

## 2011-01-14 DIAGNOSIS — Z139 Encounter for screening, unspecified: Secondary | ICD-10-CM

## 2011-01-19 ENCOUNTER — Ambulatory Visit (HOSPITAL_COMMUNITY)
Admission: RE | Admit: 2011-01-19 | Discharge: 2011-01-19 | Disposition: A | Discharge status: Home / Self Care | Payer: BC Managed Care – PPO | Source: Ambulatory Visit | Attending: Obstetrics and Gynecology | Admitting: Obstetrics and Gynecology

## 2011-01-19 DIAGNOSIS — Z1231 Encounter for screening mammogram for malignant neoplasm of breast: Secondary | ICD-10-CM | POA: Insufficient documentation

## 2011-01-19 DIAGNOSIS — Z139 Encounter for screening, unspecified: Secondary | ICD-10-CM

## 2011-10-28 ENCOUNTER — Encounter (HOSPITAL_COMMUNITY): Payer: Self-pay

## 2011-10-28 ENCOUNTER — Inpatient Hospital Stay (HOSPITAL_COMMUNITY): Payer: BC Managed Care – PPO

## 2011-10-28 ENCOUNTER — Inpatient Hospital Stay (HOSPITAL_COMMUNITY)
Admission: EM | Admit: 2011-10-28 | Discharge: 2011-10-29 | DRG: 012 | Disposition: A | Discharge status: Home / Self Care | Payer: BC Managed Care – PPO | Attending: Internal Medicine | Admitting: Internal Medicine

## 2011-10-28 ENCOUNTER — Emergency Department (HOSPITAL_COMMUNITY): Payer: BC Managed Care – PPO

## 2011-10-28 DIAGNOSIS — I10 Essential (primary) hypertension: Secondary | ICD-10-CM

## 2011-10-28 DIAGNOSIS — R209 Unspecified disturbances of skin sensation: Secondary | ICD-10-CM

## 2011-10-28 DIAGNOSIS — R000 Tachycardia, unspecified: Secondary | ICD-10-CM | POA: Diagnosis present

## 2011-10-28 DIAGNOSIS — G8190 Hemiplegia, unspecified affecting unspecified side: Principal | ICD-10-CM | POA: Diagnosis present

## 2011-10-28 DIAGNOSIS — I2510 Atherosclerotic heart disease of native coronary artery without angina pectoris: Secondary | ICD-10-CM | POA: Diagnosis present

## 2011-10-28 DIAGNOSIS — E785 Hyperlipidemia, unspecified: Secondary | ICD-10-CM | POA: Diagnosis present

## 2011-10-28 DIAGNOSIS — E039 Hypothyroidism, unspecified: Secondary | ICD-10-CM | POA: Diagnosis present

## 2011-10-28 DIAGNOSIS — D696 Thrombocytopenia, unspecified: Secondary | ICD-10-CM | POA: Diagnosis present

## 2011-10-28 DIAGNOSIS — R200 Anesthesia of skin: Secondary | ICD-10-CM | POA: Diagnosis present

## 2011-10-28 DIAGNOSIS — R531 Weakness: Secondary | ICD-10-CM | POA: Diagnosis present

## 2011-10-28 DIAGNOSIS — I639 Cerebral infarction, unspecified: Secondary | ICD-10-CM | POA: Diagnosis present

## 2011-10-28 HISTORY — DX: Atherosclerotic heart disease of native coronary artery without angina pectoris: I25.10

## 2011-10-28 HISTORY — DX: Hypothyroidism, unspecified: E03.9

## 2011-10-28 HISTORY — DX: Other hemorrhoids: K64.8

## 2011-10-28 HISTORY — DX: Anxiety disorder, unspecified: F41.9

## 2011-10-28 HISTORY — DX: Essential (primary) hypertension: I10

## 2011-10-28 HISTORY — DX: Hyperlipidemia, unspecified: E78.5

## 2011-10-28 HISTORY — DX: Overactive bladder: N32.81

## 2011-10-28 HISTORY — DX: Other seasonal allergic rhinitis: J30.2

## 2011-10-28 HISTORY — DX: Thrombocytopenia, unspecified: D69.6

## 2011-10-28 LAB — DIFFERENTIAL
Basophils Absolute: 0 10*3/uL (ref 0.0–0.1)
Basophils Relative: 1 % (ref 0–1)
Eosinophils Absolute: 0.2 10*3/uL (ref 0.0–0.7)
Eosinophils Relative: 3 % (ref 0–5)
Lymphocytes Relative: 43 % (ref 12–46)
Lymphs Abs: 3 10*3/uL (ref 0.7–4.0)
Monocytes Absolute: 0.7 10*3/uL (ref 0.1–1.0)
Monocytes Relative: 10 % (ref 3–12)
Neutro Abs: 3 10*3/uL (ref 1.7–7.7)
Neutrophils Relative %: 44 % (ref 43–77)

## 2011-10-28 LAB — BASIC METABOLIC PANEL
BUN: 17 mg/dL (ref 6–23)
CO2: 27 mEq/L (ref 19–32)
Calcium: 10.5 mg/dL (ref 8.4–10.5)
Chloride: 99 mEq/L (ref 96–112)
Creatinine, Ser: 0.58 mg/dL (ref 0.50–1.10)
GFR calc Af Amer: >90 mL/min (ref >90)
GFR calc non Af Amer: >90 mL/min (ref >90)
Glucose, Bld: 105 mg/dL — ABNORMAL HIGH (ref 70–99)
Potassium: 3.5 mEq/L (ref 3.5–5.1)
Sodium: 139 mEq/L (ref 135–145)

## 2011-10-28 LAB — CARDIAC PANEL(CRET KIN+CKTOT+MB+TROPI)
CK, MB: 6.3 ng/mL (ref 0.3–4.0)
Relative Index: 4 — ABNORMAL HIGH (ref 0.0–2.5)
Total CK: 157 U/L (ref 7–177)
Troponin I: <0.3 ng/mL (ref <0.30)

## 2011-10-28 LAB — CBC
HCT: 42.4 % (ref 36.0–46.0)
Hemoglobin: 14.3 g/dL (ref 12.0–15.0)
MCH: 30 pg (ref 26.0–34.0)
MCHC: 33.7 g/dL (ref 30.0–36.0)
MCV: 89.1 fL (ref 78.0–100.0)
Platelets: 98 10*3/uL — ABNORMAL LOW (ref 150–400)
RBC: 4.76 MIL/uL (ref 3.87–5.11)
RDW: 12.7 % (ref 11.5–15.5)
WBC: 6.9 10*3/uL (ref 4.0–10.5)

## 2011-10-28 LAB — TROPONIN I: Troponin I: <0.3 ng/mL (ref <0.30)

## 2011-10-28 LAB — PROTIME-INR
INR: 0.99 (ref 0.00–1.49)
Prothrombin Time: 13.3 seconds (ref 11.6–15.2)

## 2011-10-28 MED ORDER — LOSARTAN POTASSIUM 50 MG PO TABS
50.0000 mg | ORAL_TABLET | Freq: Every day | ORAL | Status: DC
  Administered 2011-10-29: 50 mg via ORAL
  Filled 2011-10-28: qty 1

## 2011-10-28 MED ORDER — NIACIN ER (ANTIHYPERLIPIDEMIC) 500 MG PO TBCR
500.0000 mg | EXTENDED_RELEASE_TABLET | Freq: Every day | ORAL | Status: DC
  Filled 2011-10-28 (×2): qty 1

## 2011-10-28 MED ORDER — LEVOTHYROXINE SODIUM 100 MCG PO TABS
100.0000 ug | ORAL_TABLET | Freq: Every morning | ORAL | Status: DC
  Administered 2011-10-29: 100 ug via ORAL
  Filled 2011-10-28: qty 1

## 2011-10-28 MED ORDER — LORATADINE 10 MG PO TABS
10.0000 mg | ORAL_TABLET | Freq: Every day | ORAL | Status: DC
  Administered 2011-10-29: 10 mg via ORAL
  Filled 2011-10-28: qty 1

## 2011-10-28 MED ORDER — ACETAMINOPHEN 650 MG RE SUPP: 650.0000 mg | RECTAL | Status: DC | PRN

## 2011-10-28 MED ORDER — ALPRAZOLAM 0.5 MG PO TABS: 0.5000 mg | ORAL_TABLET | Freq: Four times a day (QID) | ORAL | Status: DC | PRN

## 2011-10-28 MED ORDER — ATORVASTATIN CALCIUM 40 MG PO TABS
80.0000 mg | ORAL_TABLET | Freq: Every day | ORAL | Status: DC
  Administered 2011-10-28: 80 mg via ORAL
  Filled 2011-10-28: qty 2

## 2011-10-28 MED ORDER — SENNOSIDES-DOCUSATE SODIUM 8.6-50 MG PO TABS: 1.0000 | ORAL_TABLET | Freq: Every evening | ORAL | Status: DC | PRN

## 2011-10-28 MED ORDER — ACETAMINOPHEN 325 MG PO TABS: 650.0000 mg | ORAL_TABLET | ORAL | Status: DC | PRN

## 2011-10-28 MED ORDER — TOLTERODINE TARTRATE 2 MG PO TABS
2.0000 mg | ORAL_TABLET | Freq: Two times a day (BID) | ORAL | Status: DC
  Administered 2011-10-28 – 2011-10-29 (×2): 2 mg via ORAL
  Filled 2011-10-28 (×2): qty 1

## 2011-10-28 MED ORDER — DIPHENHYDRAMINE HCL 25 MG PO CAPS: 25.0000 mg | ORAL_CAPSULE | Freq: Every evening | ORAL | Status: DC | PRN

## 2011-10-28 MED ORDER — ASPIRIN 81 MG PO CHEW
81.0000 mg | CHEWABLE_TABLET | Freq: Every day | ORAL | Status: DC
  Administered 2011-10-28: 81 mg via ORAL
  Filled 2011-10-28: qty 1

## 2011-10-28 MED ORDER — VITAMIN C 500 MG PO TABS
500.0000 mg | ORAL_TABLET | Freq: Every morning | ORAL | Status: DC
Start: 2011-10-28 — End: 2011-10-29
  Administered 2011-10-29: 500 mg via ORAL
  Filled 2011-10-28: qty 1

## 2011-10-28 MED ORDER — ONDANSETRON HCL 4 MG/2ML IJ SOLN: 4.0000 mg | Freq: Four times a day (QID) | INTRAMUSCULAR | Status: DC | PRN

## 2011-10-28 MED ORDER — VITAMIN E 180 MG (400 UNIT) PO CAPS
400.0000 [IU] | ORAL_CAPSULE | Freq: Every morning | ORAL | Status: DC
  Administered 2011-10-29: 400 [IU] via ORAL
  Filled 2011-10-28 (×3): qty 1

## 2011-10-28 MED ORDER — SODIUM CHLORIDE 0.9 % IV SOLN: INTRAVENOUS | Status: DC

## 2011-10-28 MED ORDER — VITAMIN B-12 1000 MCG PO TABS
1000.0000 ug | ORAL_TABLET | Freq: Every morning | ORAL | Status: DC
  Administered 2011-10-29: 1000 ug via ORAL
  Filled 2011-10-28: qty 1

## 2011-10-28 MED ORDER — CLOPIDOGREL BISULFATE 75 MG PO TABS
75.0000 mg | ORAL_TABLET | Freq: Every day | ORAL | Status: DC
  Administered 2011-10-29: 75 mg via ORAL
  Filled 2011-10-28: qty 1

## 2011-10-28 MED ORDER — ASPIRIN 325 MG PO TABS: 325.0000 mg | ORAL_TABLET | Freq: Every day | ORAL | Status: DC

## 2011-10-28 MED ORDER — SODIUM CHLORIDE 0.9 % IJ SOLN: 3.0000 mL | INTRAMUSCULAR | Status: DC

## 2011-10-28 MED ORDER — ADULT MULTIVITAMIN W/MINERALS CH
1.0000 | ORAL_TABLET | Freq: Every morning | ORAL | Status: DC
  Administered 2011-10-29: 1 via ORAL
  Filled 2011-10-28: qty 1

## 2011-10-28 NOTE — ED Notes (Signed)
Spoke with SOC, was advised that they had all the information and that the neuro would be on teleneuro machine within 15 minutes. teleneuro machine placed in pt's room monitor is on. Pt and family updated.

## 2011-10-28 NOTE — Consult Note (Signed)
NAME:  Kristina Mcdonald, Kristina Mcdonald                ACCOUNT NO.:  [id number]  MEDICAL RECORD NO.:  [id number]  LOCATION:  A313                          FACILITY:  [facility]  PHYSICIAN:  Shaylyn Bawa A. Gerilyn Pilgrim, M.D. DATE OF BIRTH:  Jul 17, 1955  DATE OF CONSULTATION: DATE OF DISCHARGE:                                CONSULTATION   The patient is a 56 year old right-handed white female who reports a relatively acute onset of numbness and tingling of the left facial area and periorbital area involving the V2 and V3 distribution.  At the V2 and V3 distribution, she subsequently developed numbness and weakness of the left upper and left lower extremities.  She developed symptoms about 8 o'clock, and they are still present.  She has had no previous spells like this previously.  She reportedly has had changes in her medications for bladder incontinence.  That is the only change that she has had. She was started on Xanax also apparently, but in fact, she reports she has been on this previously.  The dose was increased, although she is taking it rarely.  She only has taken 6 in the last 30 days.  The Xanax was increased because of significant psychosocial stressors at home related to a grandson who has moved in with them.  She has had an extensive workup so far, and the evaluation has been unremarkable.  She has had MRI and MRA, which has showed no acute infarcts.  Normal drain and normal intracranial structures.  Carotid Doppler has also been unremarkable.  PHYSICAL EXAMINATION:  GENERAL:  Pleasant, obese lady.  She is in no acute distress. HEENT:  Neck is supple.  Head is normocephalic, atraumatic. ABDOMEN:  Soft. EXTREMITIES:  No significant edema. MENTATION:  She is awake and alert.  She is lucid and coherent.  Speech, language, and cognition are intact. CRANIAL NERVES:  Pupils are 4 mm and brisk, reactive.  Extraocular movements are intact.  Facial muscle strength is symmetric.  Tongue is midline.  Uvula  midline.  She does seem to have reviewed sensation V2 and V3 distribution on the left side.  Motor examination shows weakness on the left side graded at 4 including biceps, triceps, hand grip, hip flexion, and dorsiflexion.  Bulk and tone are normal.  Right side shows normal tone, bulk, and strength.  She has somewhat of a downward drift that is mild.  Left upper extremity reflexes are preserved.  Sensation is normal to light touch and temperature in the extremities. Coordination showed no dysmetria.  No past-pointing, no tremors.  IMPRESSION:  Focal neurological symptoms without anatomic correlation on MRI and MRA or carotid duplex/Doppler.  This was just a functional problem, possibly psychogenic or less likely unusual seizures.  RECOMMENDATIONS:  Continue with aspirin, obtain EEG, and repeat the examination.     Mikia Delaluz A. Gerilyn Pilgrim, M.D.     KAD/MEDQ  D:  10/28/2011  T:  10/28/2011  Job:  [id number]

## 2011-10-28 NOTE — ED Notes (Addendum)
Dr. Ignacia Palma at bedside, pt presents to er with left side facial tingling at around 8am, at 9 pt began to experience left side tingling that extends to left arm and left leg area, pt reports left eye has "haze", pt states that she felt like she was going to fall due to feeling of left leg heaviness. Pt also c/o sob and nausea. On exam pt reports "film" on left eye, facial weakness noted to left side of mouth,  Pt had difficulty attempting to touch nose with left hand, pt report heaviness to left arm, pt attempted to move left leg with difficulty.

## 2011-10-28 NOTE — H&P (Signed)
Kristina Mcdonald MRN: [id number] DOB/AGE: [birth year] 56 y.o. Primary Care Physician:GOLDING,JOHN CABOT, MD, MD Admit date: 10/28/2011 Chief Complaint: Left-sided numbness and weakness. HPI: The patient is a 56 year old woman with a history significant for chronic thrombocytopenia, hypertension, and hyperlipidemia, who presents to the emergency department today with a chief complaint of left facial tingling, left arm numbness, left leg numbness, and weakness on the left. She woke up this morning in her usual state of health. While at work, at approximately 8:00 AM, she experienced left facial tingling. Shortly thereafter, she experienced left arm and left leg numbness. Her left arm and left leg began to feel heavy. She had difficulty ambulating. She feels as if there is a film over her left eye, but she denies double vision or blindness. She had a global headache yesterday. She has no right-sided symptoms. She has no difficulty speaking. She has no difficulty swallowing. She denies chest pain. She did have some transient shortness of breath earlier, but not now. She denies palpitations, nausea, vomiting, or diarrhea.   In the emergency department, she is noted to be hemodynamically stable and afebrile. Her lab data are significant for a platelet count of 98. CT scan of her head reveals no acute intracranial findings. The tele-neurologist evaluated the patient in the emergency department. Because of her thrombocytopenia, TPA was not recommended. It was recommended that the patient be admitted for further evaluation and management.  Past Medical History  Diagnosis Date  . Overactive bladder   . Hypertension   . Hypothyroidism 10/2006    Status post thyroidectomy for thyroid mass.  . Hyperlipidemia   . Thrombocytopenia     Chronic  . CAD (coronary artery disease) 02/2001    Nonobstructive per cath.  . Internal hemorrhoids 09/2003  . Chronic anxiety   . Seasonal allergies     Past Surgical History    Procedure Date  . Total abdominal hysterectomy w/ bilateral salpingoophorectomy 09/2001.  Marland Kitchen Thyroidectomy 10/2006    Neg for cancer per patient  . Laparotomy 11/2002    Excision of ovarian remnant  . Appendectomy 11/2002.    Incidental during laparotomy and excision of ovarian remnant.  . Cardiac catheterization 02/2001    Nonobstructive CAD  . Colonoscopy 09/2003    Per Dr. Jena Gauss. Internal hemorrhoids.    Prior to Admission medications   Medication Sig Start Date End Date Taking? Authorizing Provider  ALPRAZolam Prudy Feeler) 0.5 MG tablet Take 0.5-1 mg by mouth 4 (four) times daily as needed. For anxiety   Yes Historical Provider, MD  aspirin 325 MG tablet Take 325 mg by mouth every morning.    Yes Historical Provider, MD  cetirizine (ZYRTEC) 10 MG tablet Take 10 mg by mouth every morning.    Yes Historical Provider, MD  diphenhydrAMINE (BENADRYL) 25 MG tablet Take 25 mg by mouth at bedtime as needed. For sleep   Yes Historical Provider, MD  levothyroxine (SYNTHROID, LEVOTHROID) 100 MCG tablet Take 100 mcg by mouth every morning.    Yes Historical Provider, MD  losartan-hydrochlorothiazide (HYZAAR) 50-12.5 MG per tablet Take 1 tablet by mouth every morning.    Yes Historical Provider, MD  Multiple Vitamin (MULITIVITAMIN WITH MINERALS) TABS Take 1 tablet by mouth every morning.    Yes Historical Provider, MD  niacin (NIASPAN) 500 MG CR tablet Take 500 mg by mouth at bedtime.   Yes Historical Provider, MD  rosuvastatin (CRESTOR) 40 MG tablet Take 40 mg by mouth at bedtime.    Yes Historical Provider, MD  tolterodine (DETROL) 2 MG tablet Take 2 mg by mouth 2 (two) times daily.   Yes Historical Provider, MD  vitamin B-12 (CYANOCOBALAMIN) 1000 MCG tablet Take 1,000 mcg by mouth every morning.    Yes Historical Provider, MD  vitamin C (ASCORBIC ACID) 500 MG tablet Take 500 mg by mouth every morning.    Yes Historical Provider, MD  vitamin E 400 UNIT capsule Take 400 Units by mouth every morning.    Yes  Historical Provider, MD    Allergies: No Known Allergies   Family history: Her mother died of a stroke at 41 years of age. Her father died of prostate cancer.  Social History:  She is married. She has 2 children. She is employed. She denies tobacco, alcohol, and illicit drug use.       ROS: Otherwise negative.   PHYSICAL EXAM: Blood pressure 131/64, pulse 112, temperature 98.3 F (36.8 C), temperature source Oral, resp. rate 22, height 5' (1.524 m), weight 90.719 kg (200 lb), SpO2 99.00%.  General: 56 year old overweight Caucasian woman, sitting up in the and, in no acute distress. HEENT: Head is normocephalic, nontraumatic. Pupils are equal, round, and reactive to light. Extraocular movements intact. Conjunctivae are clear. Sclerae are white. Tympanic membrane on the right with scarring and surgical changes. Tympanic membrane on the left clear. Nasal mucosa is dry. Oropharynx reveals moist mucous membranes. No posterior exudates or erythema. Neck: Supple, no adenopathy, no thyromegaly, no JVD. Well-healed horizontal scar. Lungs: Decreased breath sounds at bases, otherwise clear. Heart: S1, S2, with no murmurs rubs or gallops. Abdomen: Obese, positive sounds, soft, nontender, nondistended. Extremities: Pedal pulses palpable. No pretibial edema. No pedal edema. Neurologic: She is alert and oriented x3. Cranial nerves II through XII are grossly intact with exception of decreased sensation over the left face. No obvious facial droop, dysarthria, or aphasia . Left pronator drift present. Strength of the left upper extremity is 3+ to 4 minus over 5. Strength of the left lower extremity is approximately 3+ to 4 minus over 5. Sensation globally decreased on the left. Patient was unable to perform left finger to nose cerebellar testing. Sensation and strength on the right intact. Psychiatric: Sad affect. Cooperative.   Basic Metabolic Panel:  Basename 10/28/11 1052  NA 139  K 3.5  CL 99    CO2 27  GLUCOSE 105*  BUN 17  CREATININE 0.58  CALCIUM 10.5  MG --  PHOS --   Liver Function Tests: No results found for this basename: AST:2,ALT:2,ALKPHOS:2,BILITOT:2,PROT:2,ALBUMIN:2 in the last 72 hours No results found for this basename: LIPASE:2,AMYLASE:2 in the last 72 hours No results found for this basename: AMMONIA:2 in the last 72 hours CBC:  Basename 10/28/11 1052  WBC 6.9  NEUTROABS 3.0  HGB 14.3  HCT 42.4  MCV 89.1  PLT 98*   Cardiac Enzymes:  Basename 10/28/11 1052  CKTOTAL --  CKMB --  CKMBINDEX --  TROPONINI <0.30   BNP: No results found for this basename: PROBNP:3 in the last 72 hours D-Dimer: No results found for this basename: DDIMER:2 in the last 72 hours CBG: No results found for this basename: GLUCAP:6 in the last 72 hours Hemoglobin A1C: No results found for this basename: HGBA1C in the last 72 hours Fasting Lipid Panel: No results found for this basename: CHOL,HDL,LDLCALC,TRIG,CHOLHDL,LDLDIRECT in the last 72 hours Thyroid Function Tests: No results found for this basename: TSH,T4TOTAL,FREET4,T3FREE,THYROIDAB in the last 72 hours Anemia Panel: No results found for this basename: VITAMINB12,FOLATE,FERRITIN,TIBC,IRON,RETICCTPCT in the last 72  hours Coagulation:  Basename 10/28/11 1052  LABPROT 13.3  INR 0.99   Urine Drug Screen: Drugs of Abuse  No results found for this basename: labopia,  cocainscrnur,  labbenz,  amphetmu,  thcu,  labbarb    Alcohol Level: No results found for this basename: ETH:2 in the last 72 hours Urinalysis: No results found for this basename: COLORURINE:2,APPERANCEUR:2,LABSPEC:2,PHURINE:2,GLUCOSEU:2,HGBUR:2,BILIRUBINUR:2,KETONESUR:2,PROTEINUR:2,UROBILINOGEN:2,NITRITE:2,LEUKOCYTESUR:2 in the last 72 hours Misc. Labs:   EKG: Sinus tachycardia with a heart rate of 109 beats per minute and nonspecific ST and T wave abnormalities.   No results found for this or any previous visit (from the past 240 hour(s)).    Results for orders placed during the hospital encounter of 10/28/11 (from the past 48 hour(s))  CBC     Status: Abnormal   Collection Time   10/28/11 10:52 AM      Component Value Range Comment   WBC 6.9  4.0 - 10.5 (K/uL)    RBC 4.76  3.87 - 5.11 (MIL/uL)    Hemoglobin 14.3  12.0 - 15.0 (g/dL)    HCT 95.6  21.3 - 08.6 (%)    MCV 89.1  78.0 - 100.0 (fL)    MCH 30.0  26.0 - 34.0 (pg)    MCHC 33.7  30.0 - 36.0 (g/dL)    RDW 57.8  46.9 - 62.9 (%)    Platelets 98 (*) 150 - 400 (K/uL)   DIFFERENTIAL     Status: Normal   Collection Time   10/28/11 10:52 AM      Component Value Range Comment   Neutrophils Relative 44  43 - 77 (%)    Neutro Abs 3.0  1.7 - 7.7 (K/uL)    Lymphocytes Relative 43  12 - 46 (%)    Lymphs Abs 3.0  0.7 - 4.0 (K/uL)    Monocytes Relative 10  3 - 12 (%)    Monocytes Absolute 0.7  0.1 - 1.0 (K/uL)    Eosinophils Relative 3  0 - 5 (%)    Eosinophils Absolute 0.2  0.0 - 0.7 (K/uL)    Basophils Relative 1  0 - 1 (%)    Basophils Absolute 0.0  0.0 - 0.1 (K/uL)   BASIC METABOLIC PANEL     Status: Abnormal   Collection Time   10/28/11 10:52 AM      Component Value Range Comment   Sodium 139  135 - 145 (mEq/L)    Potassium 3.5  3.5 - 5.1 (mEq/L)    Chloride 99  96 - 112 (mEq/L)    CO2 27  19 - 32 (mEq/L)    Glucose, Bld 105 (*) 70 - 99 (mg/dL)    BUN 17  6 - 23 (mg/dL)    Creatinine, Ser 5.28  0.50 - 1.10 (mg/dL)    Calcium 41.3  8.4 - 10.5 (mg/dL)    GFR calc non Af Amer >90  >90 (mL/min)    GFR calc Af Amer >90  >90 (mL/min)   TROPONIN I     Status: Normal   Collection Time   10/28/11 10:52 AM      Component Value Range Comment   Troponin I <0.30  <0.30 (ng/mL)   PROTIME-INR     Status: Normal   Collection Time   10/28/11 10:52 AM      Component Value Range Comment   Prothrombin Time 13.3  11.6 - 15.2 (seconds)    INR 0.99  0.00 - 1.49      Ct Head Wo Contrast  10/28/2011  *RADIOLOGY REPORT*  Clinical Data: Code stroke  CT HEAD WITHOUT CONTRAST  Technique:   Contiguous axial images were obtained from the base of the skull through the vertex without contrast.  Comparison: 10/26/2005  Findings: There is no evidence for acute hemorrhage, hydrocephalus, mass lesion, or abnormal extra-axial fluid collection.  No definite CT evidence for acute infarction.  Chronic mucosal disease is seen in the right maxillary sinus.  The remaining visualized paranasal sinuses are clear.  IMPRESSION: No acute intracranial abnormality.  I personally called these findings to Dr. Ignacia Palma at 11:19 a.m. on 10/28/2011.  Original Report Authenticated By: ERIC A. MANSELL, M.D.    Impression:  Principal Problem:  *Left sided numbness Active Problems:  Left-sided weakness  Thrombocytopenia  Acute thrombotic stroke  Hypertension  Hyperlipidemia    1. Left-sided paresis and left-sided paresthesias. This is an acute right brain stroke until proven otherwise. She has failed aspirin therapy. She takes 81 mg of aspirin in the morning and 325 mg at bedtime before taking Niaspan.  2. Chronic thrombocytopenia. The patient had been followed by Dr. Mariel Sleet in the past. She does not recall the specific disorder she has. She has not had any bleeding episodes while on aspirin.  3. Hypertension. She is treated chronically with Hyzaar.  4. Hyperlipidemia. She is treated with both Niaspan and Crestor.  5. Hypothyroidism, status post thyroidectomy in the past. She is on Synthroid.  Plan:  1. We'll start Plavix. We'll change bedtime aspirin dose to 81 mg to be given just before Niaspan. 2. [facility] consult neurology for further recommendations. We'll consult the occupational therapist and physical therapist. 3. The patient passed the bedside swallow evaluation in the emergency department. Will advance diet to a heart healthy diet. 4. We'll start the stroke workup with an MRI/MRA of the brain, carotid ultrasound, and 2-D echocardiogram. 5. For further evaluation, we'll check a hemoglobin  A1c, cardiac enzymes, lipid panel, TSH, free T4, and vitamin B12. 6. I did ask Mr. Jacalyn Lefevre to review the notes by Dr. Mariel Sleet to determine the etiology of the patient's chronic thrombocytopenia.     Rheannon Cerney 10/28/2011, 2:18 PM

## 2011-10-28 NOTE — ED Notes (Signed)
Report given to [place], RN on 300, pt will go to MRI before being transported to floor. Pt and family updated.

## 2011-10-28 NOTE — ED Notes (Signed)
telepsych completed by Dr. Remi Deter, advised that pt is not a tpa candidate due to low platelets of 98

## 2011-10-28 NOTE — ED Notes (Signed)
Pt in MRI, family went to get something to eat and then was going upstairs,

## 2011-10-28 NOTE — Consult Note (Signed)
Reason for Consult Referring Physician:   [name] is an 56 y.o. female.  HPI:   Past Medical History  Diagnosis Date  . Overactive bladder   . Hypertension   . Hypothyroidism 10/2006    Status post thyroidectomy for thyroid mass.  . Hyperlipidemia   . Thrombocytopenia     Chronic  . CAD (coronary artery disease) 02/2001    Nonobstructive per cath.  . Internal hemorrhoids 09/2003  . Chronic anxiety   . Seasonal allergies     Past Surgical History  Procedure Date  . Total abdominal hysterectomy w/ bilateral salpingoophorectomy 09/2001.  Marland Kitchen Thyroidectomy 10/2006    Neg for cancer per patient  . Laparotomy 11/2002    Excision of ovarian remnant  . Appendectomy 11/2002.    Incidental during laparotomy and excision of ovarian remnant.  . Cardiac catheterization 02/2001    Nonobstructive CAD  . Colonoscopy 09/2003    Per Dr. Jena Gauss. Internal hemorrhoids.    History reviewed. No pertinent family history.  Social History:  reports that she has never smoked. She does not have any smokeless tobacco history on file. She reports that she does not drink alcohol or use illicit drugs.  Allergies: No Known Allergies  Medications:  Prior to Admission medications   Medication Sig Start Date End Date Taking? Authorizing Provider  ALPRAZolam Prudy Feeler) 0.5 MG tablet Take 0.5-1 mg by mouth 4 (four) times daily as needed. For anxiety   Yes Historical Provider, MD  aspirin 325 MG tablet Take 325 mg by mouth every morning.    Yes Historical Provider, MD  cetirizine (ZYRTEC) 10 MG tablet Take 10 mg by mouth every morning.    Yes Historical Provider, MD  diphenhydrAMINE (BENADRYL) 25 MG tablet Take 25 mg by mouth at bedtime as needed. For sleep   Yes Historical Provider, MD  levothyroxine (SYNTHROID, LEVOTHROID) 100 MCG tablet Take 100 mcg by mouth every morning.    Yes Historical Provider, MD  losartan-hydrochlorothiazide (HYZAAR) 50-12.5 MG per tablet Take 1 tablet by mouth every morning.    Yes  Historical Provider, MD  Multiple Vitamin (MULITIVITAMIN WITH MINERALS) TABS Take 1 tablet by mouth every morning.    Yes Historical Provider, MD  niacin (NIASPAN) 500 MG CR tablet Take 500 mg by mouth at bedtime.   Yes Historical Provider, MD  rosuvastatin (CRESTOR) 40 MG tablet Take 40 mg by mouth at bedtime.    Yes Historical Provider, MD  tolterodine (DETROL) 2 MG tablet Take 2 mg by mouth 2 (two) times daily.   Yes Historical Provider, MD  vitamin B-12 (CYANOCOBALAMIN) 1000 MCG tablet Take 1,000 mcg by mouth every morning.    Yes Historical Provider, MD  vitamin C (ASCORBIC ACID) 500 MG tablet Take 500 mg by mouth every morning.    Yes Historical Provider, MD  vitamin E 400 UNIT capsule Take 400 Units by mouth every morning.    Yes Historical Provider, MD   Scheduled Meds:   . aspirin  81 mg Oral QHS  . atorvastatin  80 mg Oral q1800  . clopidogrel  75 mg Oral Q breakfast  . levothyroxine  100 mcg Oral q morning - 10a  . loratadine  10 mg Oral Daily  . losartan  50 mg Oral Daily  . mulitivitamin with minerals  1 tablet Oral q morning - 10a  . niacin  500 mg Oral QHS  . tolterodine  2 mg Oral BID  . vitamin B-12  1,000 mcg Oral q morning -  10a  . vitamin C  500 mg Oral q morning - 10a  . vitamin E  400 Units Oral q morning - 10a  . DISCONTD: aspirin  325 mg Oral Daily  . DISCONTD: sodium chloride  3 mL Intravenous STAT   Continuous Infusions:   . DISCONTD: sodium chloride     PRN Meds:.acetaminophen, acetaminophen, ALPRAZolam, diphenhydrAMINE, ondansetron (ZOFRAN) IV, senna-docusate   Results for orders placed during the hospital encounter of 10/28/11 (from the past 48 hour(s))  CBC     Status: Abnormal   Collection Time   10/28/11 10:52 AM      Component Value Range Comment   WBC 6.9  4.0 - 10.5 (K/uL)    RBC 4.76  3.87 - 5.11 (MIL/uL)    Hemoglobin 14.3  12.0 - 15.0 (g/dL)    HCT 16.1  09.6 - 04.5 (%)    MCV 89.1  78.0 - 100.0 (fL)    MCH 30.0  26.0 - 34.0 (pg)    MCHC  33.7  30.0 - 36.0 (g/dL)    RDW 40.9  81.1 - 91.4 (%)    Platelets 98 (*) 150 - 400 (K/uL)   DIFFERENTIAL     Status: Normal   Collection Time   10/28/11 10:52 AM      Component Value Range Comment   Neutrophils Relative 44  43 - 77 (%)    Neutro Abs 3.0  1.7 - 7.7 (K/uL)    Lymphocytes Relative 43  12 - 46 (%)    Lymphs Abs 3.0  0.7 - 4.0 (K/uL)    Monocytes Relative 10  3 - 12 (%)    Monocytes Absolute 0.7  0.1 - 1.0 (K/uL)    Eosinophils Relative 3  0 - 5 (%)    Eosinophils Absolute 0.2  0.0 - 0.7 (K/uL)    Basophils Relative 1  0 - 1 (%)    Basophils Absolute 0.0  0.0 - 0.1 (K/uL)   BASIC METABOLIC PANEL     Status: Abnormal   Collection Time   10/28/11 10:52 AM      Component Value Range Comment   Sodium 139  135 - 145 (mEq/L)    Potassium 3.5  3.5 - 5.1 (mEq/L)    Chloride 99  96 - 112 (mEq/L)    CO2 27  19 - 32 (mEq/L)    Glucose, Bld 105 (*) 70 - 99 (mg/dL)    BUN 17  6 - 23 (mg/dL)    Creatinine, Ser 7.82  0.50 - 1.10 (mg/dL)    Calcium 95.6  8.4 - 10.5 (mg/dL)    GFR calc non Af Amer >90  >90 (mL/min)    GFR calc Af Amer >90  >90 (mL/min)   TROPONIN I     Status: Normal   Collection Time   10/28/11 10:52 AM      Component Value Range Comment   Troponin I <0.30  <0.30 (ng/mL)   PROTIME-INR     Status: Normal   Collection Time   10/28/11 10:52 AM      Component Value Range Comment   Prothrombin Time 13.3  11.6 - 15.2 (seconds)    INR 0.99  0.00 - 1.49    CARDIAC PANEL(CRET KIN+CKTOT+MB+TROPI)     Status: Abnormal   Collection Time   10/28/11  1:42 PM      Component Value Range Comment   Total CK 157  7 - 177 (U/L)    CK, MB 6.3 (*) 0.3 -  4.0 (ng/mL)    Troponin I <0.30  <0.30 (ng/mL)    Relative Index 4.0 (*) 0.0 - 2.5      Dg Chest 1 View  10/28/2011  *RADIOLOGY REPORT*  Clinical Data: Short of breath.  Stroke.  Left-sided weakness.  CHEST - 1 VIEW  Comparison: None.  Findings: Low lung volumes are seen however both lungs are clear. Heart size is normal.   IMPRESSION: Low lung volumes.  No acute findings.  Original Report Authenticated By: Danae Orleans, M.D.   Ct Head Wo Contrast  10/28/2011  *RADIOLOGY REPORT*  Clinical Data: Code stroke  CT HEAD WITHOUT CONTRAST  Technique:  Contiguous axial images were obtained from the base of the skull through the vertex without contrast.  Comparison: 10/26/2005  Findings: There is no evidence for acute hemorrhage, hydrocephalus, mass lesion, or abnormal extra-axial fluid collection.  No definite CT evidence for acute infarction.  Chronic mucosal disease is seen in the right maxillary sinus.  The remaining visualized paranasal sinuses are clear.  IMPRESSION: No acute intracranial abnormality.  I personally called these findings to Dr. Ignacia Palma at 11:19 a.m. on 10/28/2011.  Original Report Authenticated By: ERIC A. MANSELL, M.D.   Mr Brain Wo Contrast  10/28/2011  *RADIOLOGY REPORT*  Clinical Data:  Left-sided facial weakness and tingling  MRI HEAD WITHOUT CONTRAST MRA HEAD WITHOUT CONTRAST  Technique:  Multiplanar, multiecho pulse sequences of the brain and surrounding structures were obtained without intravenous contrast. Angiographic images of the head were obtained using MRA technique without contrast.  Comparison:  Head CT same day  MRI HEAD  Findings:  Diffusion imaging does not show any acute or subacute infarction.  The brainstem and cerebellum are normal.  The cerebral hemispheres are normal.  No mass lesion, hemorrhage, hydrocephalus or extra-axial collection.  No pituitary mass.  There are some inflammatory changes of the right maxillary sinus and the ethmoid sinuses.  IMPRESSION: Normal appearance of the brain.  Right maxillary sinus and ethmoid sinus inflammatory changes.  MRA HEAD  Findings: Both internal carotid arteries are widely patent into the brain.  No siphon stenosis.  The anterior and middle cerebral vessels are patent without proximal stenosis, aneurysm or vascular malformation.  Both vertebral  arteries are widely patent to the basilar.  No basilar stenosis.  Posterior circulation branch vessels appear normal.  IMPRESSION: Normal intracranial MR angiography of the large and medium-sized vessels.  Original Report Authenticated By: Thomasenia Sales, M.D.   US Carotid Duplex Bilateral  10/28/2011  *RADIOLOGY REPORT*  Clinical Data: Stroke symptoms, hypertension, syncope, visual disturbance  BILATERAL CAROTID DUPLEX ULTRASOUND  Technique: Gray scale imaging, color Doppler and duplex ultrasound was performed of bilateral carotid and vertebral arteries in the neck.  Comparison:  10/28/2011 MRI scan  Criteria:  Quantification of carotid stenosis is based on velocity parameters that correlate the residual internal carotid diameter with NASCET-based stenosis levels, using the diameter of the distal internal carotid lumen as the denominator for stenosis measurement.  The following velocity measurements were obtained:                   PEAK SYSTOLIC/END DIASTOLIC RIGHT ICA:                        93/21cm/sec CCA:                        109/21cm/sec SYSTOLIC ICA/CCA RATIO:     0.85 DIASTOLIC  ICA/CCA RATIO:    1.00 ECA:                        133cm/sec  LEFT ICA:                        94/26cm/sec CCA:                        132/20cm/sec SYSTOLIC ICA/CCA RATIO:     0.71 DIASTOLIC ICA/CCA RATIO:    1.29 ECA:                        135cm/sec  Findings:  RIGHT CAROTID ARTERY: Minor atherosclerotic changes.  No hemodynamically significant right ICA stenosis, velocity elevation, or turbulent flow.  Degree of narrowing less than 50%.  RIGHT VERTEBRAL ARTERY:  Antegrade  LEFT CAROTID ARTERY: Minor atherosclerosis.  No hemodynamically significant left ICA stenosis, velocity elevation, or turbulent flow.  LEFT VERTEBRAL ARTERY:  Antegrade  IMPRESSION: Minor carotid atherosclerosis.  No hemodynamically significant ICA stenosis by ultrasound on either side  Original Report Authenticated By: Judie Petit. Ruel Favors, M.D.   Mr Maxine Glenn  Head/brain Wo Cm  10/28/2011  *RADIOLOGY REPORT*  Clinical Data:  Left-sided facial weakness and tingling  MRI HEAD WITHOUT CONTRAST MRA HEAD WITHOUT CONTRAST  Technique:  Multiplanar, multiecho pulse sequences of the brain and surrounding structures were obtained without intravenous contrast. Angiographic images of the head were obtained using MRA technique without contrast.  Comparison:  Head CT same day  MRI HEAD  Findings:  Diffusion imaging does not show any acute or subacute infarction.  The brainstem and cerebellum are normal.  The cerebral hemispheres are normal.  No mass lesion, hemorrhage, hydrocephalus or extra-axial collection.  No pituitary mass.  There are some inflammatory changes of the right maxillary sinus and the ethmoid sinuses.  IMPRESSION: Normal appearance of the brain.  Right maxillary sinus and ethmoid sinus inflammatory changes.  MRA HEAD  Findings: Both internal carotid arteries are widely patent into the brain.  No siphon stenosis.  The anterior and middle cerebral vessels are patent without proximal stenosis, aneurysm or vascular malformation.  Both vertebral arteries are widely patent to the basilar.  No basilar stenosis.  Posterior circulation branch vessels appear normal.  IMPRESSION: Normal intracranial MR angiography of the large and medium-sized vessels.  Original Report Authenticated By: Thomasenia Sales, M.D.    Review of Systems  Constitutional: Negative.   HENT: Negative.   Eyes: Negative.   Respiratory: Negative.   Cardiovascular: Negative.   Gastrointestinal: Negative.   Skin: Negative.   Neurological: Positive for tingling.  Endo/Heme/Allergies: Negative.   Psychiatric/Behavioral: The patient is nervous/anxious.    Blood pressure 128/79, pulse 90, temperature 97.9 F (36.6 C), temperature source Oral, resp. rate 20, height 5' (1.524 m), weight 90.719 kg (200 lb), SpO2 92.00%. Physical Exam  Assessment/Plan: See dict  Angeles Paolucci 10/28/2011, 8:07 PM

## 2011-10-28 NOTE — ED Notes (Signed)
Pt reports feeling numbness and tingling in left side of face, left arm, and fingers since 0800.   Also reports left leg feels "heavy."   Around 0915 felt off balance.

## 2011-10-28 NOTE — ED Provider Notes (Signed)
History    This chart was scribed for Osvaldo Human, MD, MD by Smitty Pluck. The patient was seen in room [facility]18 and the patient's care was started at 10:50AM.   CSN: [id number]  Arrival date & time 10/28/11  1032   First MD Initiated Contact with Patient 10/28/11 1044      Chief Complaint  Patient presents with  . Numbness    (Consider location/radiation/quality/duration/timing/severity/associated sxs/prior treatment) Patient is a 56 y.o. female presenting with extremity weakness. The history is provided by the patient.  Extremity Weakness   Kristina Mcdonald is a 56 y.o. female who presents to the Emergency Department complaining of moderate numbness in left side of face onset today 3 hours ago. Pt reports the numbness radiated to left arm. She states that the she felt she would faint and that she had SOB. She reports that the symptoms have been constant since onset. Denies hx of similar symptoms. Denies cough, chest pain, fever diarrhea, dysuria, syncope, seizures and vomting She states she has nausea. She has hx of an overactive bladder and HTN. Pt has hx of hysterectomy and thyroid surgery. Pt has family hx of stroke and father died of prostate cancer.    PCP is Dr. Jenness Corner and Dr. Phillips Odor  Past Medical History  Diagnosis Date  . Overactive bladder   . Hypertension     Past Surgical History  Procedure Date  . Abdominal hysterectomy     No family history on file.  History  Substance Use Topics  . Smoking status: Never Smoker   . Smokeless tobacco: Not on file  . Alcohol Use: No    OB History    Grav Para Term Preterm Abortions TAB SAB Ect Mult Living                  Review of Systems  Musculoskeletal: Positive for extremity weakness.  All other systems reviewed and are negative.   10 Systems reviewed and all are negative for acute change except as noted in the HPI.   Allergies  Review of patient's allergies indicates no known allergies.  Home Medications    No current outpatient prescriptions on file.  Ht 5' (1.524 m)  Wt 200 lb (90.719 kg)  BMI 39.06 kg/m2  Physical Exam  Nursing note and vitals reviewed. Constitutional: She is oriented to person, place, and time. She appears well-developed and well-nourished. No distress.  HENT:  Head: Normocephalic and atraumatic.  Cardiovascular: Normal rate, regular rhythm and normal heart sounds.   Pulmonary/Chest: Effort normal and breath sounds normal. No respiratory distress.  Abdominal: Soft. She exhibits no distension.  Neurological: She is alert and oriented to person, place, and time.       Left facial droop Difficulty doing finger to nose with left hand  Could not lift knee to shin with the left leg   Skin: Skin is warm and dry.  Psychiatric: She has a normal mood and affect. Her behavior is normal.    ED Course  Procedures (including critical care time) DIAGNOSTIC STUDIES: Oxygen Saturation is 96% on , normal by my interpretation.    COORDINATION OF CARE: 11:03AM EDP discusses pt ED treatment with pt.    Labs Reviewed  CBC - Abnormal; Notable for the following:    Platelets 98 (*)    All other components within normal limits  BASIC METABOLIC PANEL - Abnormal; Notable for the following:    Glucose, Bld 105 (*)    All other components within  normal limits  DIFFERENTIAL  TROPONIN I  PROTIME-INR   11:19 AM I received a call from Dr. Kennith Center, radiologist. Patient's head CT is negative for stroke.  11:19 AM  Date: 10/28/2011  Rate:109  Rhythm: sinus tachycardia  QRS Axis: normal  Intervals: normal  ST/T Wave abnormalities: nonspecific ST/T changes  Conduction Disutrbances:none  Narrative Interpretation: Abnormal EKG.   Old EKG Reviewed: none available  12:10 PM Pt had teleneurology consult, was felt to have a stroke, but was not a tPA candidate because she has low platelets. She needs admission and stroke workup with MRI brain, carotid dopplers, and  echocardiogram.  Case discussed with Elliot Cousin, M.D., who will admit her to a telemetry unit.     1. CVA (cerebral infarction)      I personally performed the services described in this documentation, which was scribed in my presence. The recorded information has been reviewed and considered.  Osvaldo Human, M.D.       Carleene Cooper III, MD 10/28/11 1213

## 2011-10-28 NOTE — ED Notes (Signed)
Dr. Fisher currently at bedside.  

## 2011-10-29 ENCOUNTER — Inpatient Hospital Stay (HOSPITAL_COMMUNITY)
Admit: 2011-10-29 | Discharge: 2011-10-29 | Disposition: A | Discharge status: Home / Self Care | Payer: BC Managed Care – PPO | Attending: Neurology | Admitting: Neurology

## 2011-10-29 DIAGNOSIS — I517 Cardiomegaly: Secondary | ICD-10-CM

## 2011-10-29 DIAGNOSIS — R209 Unspecified disturbances of skin sensation: Secondary | ICD-10-CM

## 2011-10-29 DIAGNOSIS — I10 Essential (primary) hypertension: Secondary | ICD-10-CM

## 2011-10-29 DIAGNOSIS — G8190 Hemiplegia, unspecified affecting unspecified side: Secondary | ICD-10-CM

## 2011-10-29 LAB — LIPID PANEL
Cholesterol: 199 mg/dL (ref 0–200)
HDL: 46 mg/dL (ref >39)
LDL Cholesterol: 124 mg/dL — ABNORMAL HIGH (ref 0–99)
Total CHOL/HDL Ratio: 4.3 RATIO
Triglycerides: 147 mg/dL (ref <150)
VLDL: 29 mg/dL (ref 0–40)

## 2011-10-29 LAB — CBC
HCT: 43 % (ref 36.0–46.0)
Hemoglobin: 14.3 g/dL (ref 12.0–15.0)
MCH: 30 pg (ref 26.0–34.0)
MCHC: 33.3 g/dL (ref 30.0–36.0)
MCV: 90.1 fL (ref 78.0–100.0)
Platelets: 196 10*3/uL (ref 150–400)
RBC: 4.77 MIL/uL (ref 3.87–5.11)
RDW: 12.8 % (ref 11.5–15.5)
WBC: 6.7 10*3/uL (ref 4.0–10.5)

## 2011-10-29 LAB — HEMOGLOBIN A1C
Hgb A1c MFr Bld: 5.6 % (ref <5.7)
Mean Plasma Glucose: 114 mg/dL (ref <117)

## 2011-10-29 LAB — TSH: TSH: 2.024 u[IU]/mL (ref 0.350–4.500)

## 2011-10-29 LAB — T4, FREE: Free T4: 1.11 ng/dL (ref 0.80–1.80)

## 2011-10-29 LAB — VITAMIN B12: Vitamin B-12: 1233 pg/mL — ABNORMAL HIGH (ref 211–911)

## 2011-10-29 NOTE — Evaluation (Signed)
Physical Therapy Evaluation Patient Details Name: Kristina Mcdonald MRN: [id number] DOB: 13-Jan-1956 Today's Date: 10/29/2011 Time: 1015-1056 PT Time Calculation (min): 41 min  PT Assessment / Plan / Recommendation Clinical Impression  Pt was seen for eval.  She is very pleasant and cooperative, states that her sx have improved from yesterday with now no numbness.  She reports continued weakness, primarily in LLE.  MMT shows functional strength with LLE 1/2  grade weaker than RLE.  Only significant  problem involves dynamic balance where Berg Balance score puts her at significant risk of fall.  Gait is very tentative, slow and labored.  She holds her hands out to the side when walking, but no LOB.  I did instruct her in gait with a cane and this seems to improve stability a bit.  She has a cane at home.  We discussed balance exercises that she could do at home on her own, but we also offered outpatient PT for work on high level balance activities.  Of note, the degree of her sx did not waver throughout the tx.                     PT Assessment  All further PT needs can be met in the next venue of care    Follow Up Recommendations  Outpatient PT    Equipment Recommendations  None recommended by PT    Frequency      Precautions / Restrictions Precautions Precautions: Fall Restrictions Weight Bearing Restrictions: No   Pertinent Vitals/Pain       Mobility  Bed Mobility Bed Mobility: Supine to Sit;Sitting - Scoot to Edge of Bed;Sit to Supine Supine to Sit: 6: Modified independent (Device/Increase time) Sitting - Scoot to Edge of Bed: 6: Modified independent (Device/Increase time) Sit to Supine: 6: Modified independent (Device/Increase time) Transfers Transfers: Sit to Stand;Stand to Sit Sit to Stand: 5: Supervision;With upper extremity assist;From bed;From toilet Stand to Sit: 6: Modified independent (Device/Increase time);To bed;With upper extremity assist;To  toilet Ambulation/Gait Ambulation/Gait Assistance: 4: Min guard Ambulation Distance (Feet): 150 Feet (50' with cane) Assistive device: None Gait Pattern: Narrow base of support;Step-through pattern Gait velocity: slow, labored General Gait Details: gait is very "tentative", holds both arms out to the side to help balance herself...keeps head in full flexion (this is her norm)...no LOB with retro walking, sidestepping or turning head side to side Stairs: No Wheelchair Mobility Wheelchair Mobility: No    Exercises     PT Goals    Visit Information  Last PT Received On: 10/29/11    Subjective Data  Subjective: I'm feeling a little better Patient Stated Goal: return home   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, standing and doing line work Musician: No difficulties Dominant Hand: Right    Cognition  Overall Cognitive Status: Appears within functional limits for tasks assessed/performed Arousal/Alertness: Awake/alert Orientation Level: Appears intact for tasks assessed Behavior During Session: [facility] for tasks performed    Extremity/Trunk Assessment Right Upper Extremity Assessment RUE ROM/Strength/Tone: Within functional levels RUE Sensation: WFL - Light Touch RUE Coordination: WFL - gross/fine motor Left Upper Extremity Assessment LUE ROM/Strength/Tone: Within functional levels LUE Sensation: WFL - Light Touch LUE Coordination: WFL -  gross/fine motor Right Lower Extremity Assessment RLE ROM/Strength/Tone: Within functional levels RLE Sensation: WFL - Light Touch;WFL - Proprioception RLE Coordination: WFL  - gross/fine motor Left Lower Extremity Assessment LLE ROM/Strength/Tone: WFL for tasks assessed (1/2 grade lower than RLE) LLE Sensation: WFL - Light Touch;WFL - Proprioception LLE Coordination: WFL - gross/fine motor Trunk Assessment Trunk Assessment: Normal   Balance Balance Balance Assessed: Yes Standardized Balance Assessment Standardized Balance Assessment: Berg Balance Test Berg Balance Test Sit to Stand: Able to stand  independently using hands Standing Unsupported: Able to stand 2 minutes with supervision Sitting with Back Unsupported but Feet Supported on Floor or Stool: Able to sit safely and securely 2 minutes Stand to Sit: Sits safely with minimal use of hands Transfers: Able to transfer safely, definite need of hands Standing Unsupported with Eyes Closed: Able to stand 10 seconds safely Standing Ubsupported with Feet Together: Able to place feet together independently and stand for 1 minute with supervision From Standing, Reach Forward with Outstretched Arm: Can reach forward >12 cm safely (5") From Standing Position, Pick up Object from Floor: Able to pick up shoe, needs supervision From Standing Position, Turn to Look Behind Over each Shoulder: Looks behind one side only/other side shows less weight shift Turn 360 Degrees: Able to turn 360 degrees safely but slowly Standing Unsupported, Alternately Place Feet on Step/Stool: Able to stand independently and complete 8 steps >20 seconds Standing Unsupported, One Foot in Front: Able to plae foot ahead of the other independently and hold 30 seconds Standing on One Leg: Able to lift leg independently and hold 5-10 seconds Total Score: 44  High Level Balance High Level Balance Activites: Side stepping;Backward walking;Head turns High Level Balance Comments: no LOB  End of Session PT - End of Session Equipment Utilized During Treatment: Gait belt Activity Tolerance: Patient tolerated treatment well Patient left: in bed;with  call bell/phone within reach;with family/visitor present   Konrad Penta 10/29/2011, 11:14 AM

## 2011-10-29 NOTE — Progress Notes (Signed)
*  PRELIMINARY RESULTS* Echocardiogram 2D Echocardiogram has been performed.  Kristina Mcdonald 10/29/2011, 11:40 AM

## 2011-10-29 NOTE — Evaluation (Signed)
Occupational Therapy Evaluation Patient Details Name: Kristina Mcdonald MRN: [id number] DOB: December 21, 1955 Today's Date: 10/29/2011 Time: 4098-1191 OT Time Calculation (min): 10 min  OT Assessment / Plan / Recommendation Clinical Impression  A:  Patient is I with all BADLs and will have assistance from her husband if needed.  No skilled OT indicated at this time.    OT Assessment  Patient does not need any further OT services    Follow Up Recommendations       Equipment Recommendations       Frequency      Precautions / Restrictions Precautions Precautions: Fall Restrictions Weight Bearing Restrictions: No   Pertinent Vitals/Pain     ADL  ADL Comments: Patient demonstrated independence with donning and doffing slipper socks long sitting in her bed.  She was I with simulated grooming exercises.    OT Goals    Visit Information  Last OT Received On: 10/29/11    Subjective Data  Subjective: S:  My only problem is my left leg, it is a little numb. Patient Stated Goal: Get home and get back to work.   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, standing and doing line work Musician: No difficulties Dominant Hand: Right    Cognition  Overall Cognitive Status: Appears within functional limits for tasks assessed/performed Arousal/Alertness: Awake/alert Orientation Level: Appears intact for tasks assessed Behavior During Session: [facility] for tasks performed    Extremity/Trunk Assessment Right Upper Extremity Assessment RUE ROM/Strength/Tone: Within functional levels RUE Sensation: WFL - Light Touch RUE  Coordination: WFL - gross/fine motor Left Upper Extremity Assessment LUE ROM/Strength/Tone: Within functional levels LUE Sensation: WFL - Light Touch LUE Coordination: WFL - gross/fine motor Right Lower Extremity Assessment RLE ROM/Strength/Tone: Within functional levels RLE Sensation: WFL - Light Touch;WFL - Proprioception RLE Coordination: WFL - gross/fine motor Left Lower Extremity Assessment LLE ROM/Strength/Tone: WFL for tasks assessed (1/2 grade lower than RLE) LLE Sensation: WFL - Light Touch;WFL - Proprioception LLE Coordination: WFL - gross/fine motor Trunk Assessment Trunk Assessment: Normal   Mobility   Exercise    Balance   End of Session    Shirlean Mylar, OTR/L  10/29/2011, 11:10 AM

## 2011-10-29 NOTE — Progress Notes (Signed)
1830-discharge instructions given to pt. And pt.'s family with understanding verbalized. Pt. Discharged to home,pt. Taken to car via w/c.

## 2011-10-29 NOTE — Progress Notes (Signed)
Chart reviewed. Discussed with Dr. Gerilyn Pilgrim, nursing staff, physical therapy. Patient's left hand and leg with inconsistent weakness and give way phenomenon. She's apparently walking to the bathroom and back. Sensation intact and paresthesias have resolved. Will discharge later today after her EEG, echocardiogram and PT. Can followup as an outpatient for results. No need for Plavix according to neurology. I agree that this may be psychogenic, due to inconsistencies on physical examination and negative workup to date.

## 2011-10-30 NOTE — Procedures (Signed)
NAME:  Kristina Mcdonald, Kristina Mcdonald                ACCOUNT NO.:  [id number]  MEDICAL RECORD NO.:  [id number]  LOCATION:  [id number]                          FACILITY:  [facility]  PHYSICIAN:  Marylyn Appenzeller A. Gerilyn Pilgrim, M.D. DATE OF BIRTH:  1955/08/25  DATE OF PROCEDURE: DATE OF DISCHARGE:  10/29/2011                             EEG INTERPRETATION   HISTORY OF PRESENT ILLNESS:  The patient is a 56 year old who presents with altered mental status, numbness, and tingling of the left facial area and unsteady gait.  The study is being done to evaluate for nonconvulsive seizures.  MEDICATIONS:  Xanax, Benadryl, Synthroid, Lipitor, Plavix, Cozaar, Zofran, B12.  ANALYSIS:  A 16-channel recording is conducted for 21 minutes using standard 10:20 measurements.  There is a low-voltage electrical cold background activity of 20 Hz.  Additionally, there is beta activity observed in the frontal areas.  Awake and drowsy activities are observed.  Photic stimulation is carried out without abnormal changes in the background activity.  There is no focal or lateralized slowing. There is no epileptiform activity observed.  IMPRESSION:  Normal recording of awake and drowsy states.     Eashan Schipani A. Gerilyn Pilgrim, M.D.     KAD/MEDQ  D:  10/30/2011  T:  10/30/2011  Job:  [id number]

## 2011-10-31 NOTE — Discharge Summary (Signed)
Physician Discharge Summary  Patient ID: Kristina Mcdonald MRN: [id number] DOB/AGE: 1955/07/24 56 y.o.  Admit date: 10/28/2011 Discharge date: 10/31/2011  Discharge Diagnoses:  Principal Problem:  *Left sided numbness Active Problems:  Left-sided weakness  Thrombocytopenia  Hypertension  Hyperlipidemia   Medication List  As of 10/31/2011  5:07 PM   TAKE these medications         ALPRAZolam 0.5 MG tablet   Commonly known as: XANAX   Take 0.5-1 mg by mouth 4 (four) times daily as needed. For anxiety      aspirin 325 MG tablet   Take 325 mg by mouth every morning.      cetirizine 10 MG tablet   Commonly known as: ZYRTEC   Take 10 mg by mouth every morning.      diphenhydrAMINE 25 MG tablet   Commonly known as: BENADRYL   Take 25 mg by mouth at bedtime as needed. For sleep      levothyroxine 100 MCG tablet   Commonly known as: SYNTHROID, LEVOTHROID   Take 100 mcg by mouth every morning.      losartan-hydrochlorothiazide 50-12.5 MG per tablet   Commonly known as: HYZAAR   Take 1 tablet by mouth every morning.      mulitivitamin with minerals Tabs   Take 1 tablet by mouth every morning.      niacin 500 MG CR tablet   Commonly known as: NIASPAN   Take 500 mg by mouth at bedtime.      rosuvastatin 40 MG tablet   Commonly known as: CRESTOR   Take 40 mg by mouth at bedtime.      tolterodine 2 MG tablet   Commonly known as: DETROL   Take 2 mg by mouth 2 (two) times daily.      vitamin B-12 1000 MCG tablet   Commonly known as: CYANOCOBALAMIN   Take 1,000 mcg by mouth every morning.      vitamin C 500 MG tablet   Commonly known as: ASCORBIC ACID   Take 500 mg by mouth every morning.      vitamin E 400 UNIT capsule   Take 400 Units by mouth every morning.            Discharge Orders    Future Orders Please Complete By Expires   Diet - low sodium heart healthy      Discharge instructions      Comments:   Walk with cane until symptoms resolve.  May attend  outpatient physical therapy   Other Restrictions      Comments:   May return to work 11/04/11      Follow-up Information    Follow up with DOONQUAH,KOFI, MD on 11/17/2011. (@ 2:30PM)    Contact information:   [address] [place] [address] [phone]       Follow up with [facility] in 1 week. (physical therapy department)    Contact information:   [address] [place] [address]          Disposition: 01-Home or Self Care  Discharged Condition: stable  Consults: Treatment Team:  Beryle Beams, MD  Labs:   Sodium         139      Potassium         3.5      Chloride         99      CO2  27      Mean Plasma Glucose         114      BUN         17      Creatinine, Ser         0.58      Calcium         10.5      GFR calc non Af Amer         90 mL/min">90      GFR calc Af Amer         90 mL/min  The eGFR has been calculated using the CKD EPI equation. This calculation has not been validated in all clinical situations. eGFR's persistently <90 mL/min signify possible Chronic Kidney Disease.">9090 mL/min  The eGFR has been calculated using the CKD EPI equation. This calculation has not been validated in all clinical situations. eGFR's persistently <90 mL/min signify possible Chronic Kidney Disease." border=0 src="file:///C:/PROGRAM%20FILES%20(X86)/EPICSYS/V7.8/EN-US/Images/IP_COMMENT_EXIST.gif" width=5 height=10      Glucose, Bld         105       CARDIAC PROFILE    CK, MB         6.3       Total CK         157      Troponin I         <0.30        LIPID PROFILE    Cholesterol         199      Triglycerides         147      HDL         46      LDL Cholesterol         124       VLDL         29      Total CHOL/HDL Ratio         4.3       OTHER CHEM    Vitamin B-12         1233       CBC    WBC         6.7      RBC         4.77      Hemoglobin         14.3      HCT         43.0      MCV          90.1      MCH         30.0      MCHC         33.3      RDW         12.8      Platelets         196        DIFFERENTIAL    Neutrophils Relative         44      Lymphocytes Relative         43      Monocytes Relative         10      Eosinophils Relative         3      Basophils Relative         1      Neutro Abs  3.0      Lymphs Abs         3.0      Monocytes Absolute         0.7      Eosinophils Absolute         0.2      Basophils Absolute         0.0       PROTIME W/ INR    Prothrombin Time         13.3      INR         0.99       DIABETES    Hemoglobin A1C         =6.5% Diagnostic of Diabetes Mellitus (if abnormal result is confirmed) 5.7-6.4% Increased risk of developing Diabetes Mellitus References:Diagnosis and Classification of Diabetes Mellitus,Diabetes Care,2011,34(Suppl 1):S62-S69 and Standards of Medical Care in .Marland KitchenMarland Kitchen"5.6 =6.5% Diagnostic of Diabetes Mellitus (if abnormal result is confirmed) 5.7-6.4% Increased risk of developing Diabetes Mellitus References:Diagnosis and Classification of Diabetes Mellitus,Diabetes Care,2011,34(Suppl 1):S62-S69 and Standards of Medical Care in ..." border=0 src="file:///C:/PROGRAM%20FILES%20(X86)/EPICSYS/V7.8/EN-US/Images/IP_COMMENT_EXIST.gif" width=5 height=10      Glucose, Bld         105       THYROID    TSH         2.024      Free T4         1.11      Diagnostics:  Dg Chest 1 View  10/28/2011  *RADIOLOGY REPORT*  Clinical Data: Short of breath.  Stroke.  Left-sided weakness.  CHEST - 1 VIEW  Comparison: None.  Findings: Low lung volumes are seen however both lungs are clear. Heart size is normal.  IMPRESSION: Low lung volumes.  No acute findings.  Original Report Authenticated By: Danae Orleans, M.D.   Ct Head Wo Contrast  10/28/2011  *RADIOLOGY REPORT*  Clinical Data: Code stroke  CT HEAD WITHOUT CONTRAST  Technique:  Contiguous axial images were obtained from the base of the skull through the vertex without contrast.   Comparison: 10/26/2005  Findings: There is no evidence for acute hemorrhage, hydrocephalus, mass lesion, or abnormal extra-axial fluid collection.  No definite CT evidence for acute infarction.  Chronic mucosal disease is seen in the right maxillary sinus.  The remaining visualized paranasal sinuses are clear.  IMPRESSION: No acute intracranial abnormality.  I personally called these findings to Dr. Ignacia Palma at 11:19 a.m. on 10/28/2011.  Original Report Authenticated By: ERIC A. MANSELL, M.D.   Mr Brain Wo Contrast  10/28/2011  *RADIOLOGY REPORT*  Clinical Data:  Left-sided facial weakness and tingling  MRI HEAD WITHOUT CONTRAST MRA HEAD WITHOUT CONTRAST  Technique:  Multiplanar, multiecho pulse sequences of the brain and surrounding structures were obtained without intravenous contrast. Angiographic images of the head were obtained using MRA technique without contrast.  Comparison:  Head CT same day  MRI HEAD  Findings:  Diffusion imaging does not show any acute or subacute infarction.  The brainstem and cerebellum are normal.  The cerebral hemispheres are normal.  No mass lesion, hemorrhage, hydrocephalus or extra-axial collection.  No pituitary mass.  There are some inflammatory changes of the right maxillary sinus and the ethmoid sinuses.  IMPRESSION: Normal appearance of the brain.  Right maxillary sinus and ethmoid sinus inflammatory changes.  MRA HEAD  Findings: Both internal carotid arteries are widely patent into the brain.  No siphon stenosis.  The anterior and middle cerebral vessels are patent without proximal stenosis, aneurysm  or vascular malformation.  Both vertebral arteries are widely patent to the basilar.  No basilar stenosis.  Posterior circulation branch vessels appear normal.  IMPRESSION: Normal intracranial MR angiography of the large and medium-sized vessels.  Original Report Authenticated By: Thomasenia Sales, M.D.   US Carotid Duplex Bilateral  10/28/2011  *RADIOLOGY REPORT*  Clinical  Data: Stroke symptoms, hypertension, syncope, visual disturbance  BILATERAL CAROTID DUPLEX ULTRASOUND  Technique: Gray scale imaging, color Doppler and duplex ultrasound was performed of bilateral carotid and vertebral arteries in the neck.  Comparison:  10/28/2011 MRI scan  Criteria:  Quantification of carotid stenosis is based on velocity parameters that correlate the residual internal carotid diameter with NASCET-based stenosis levels, using the diameter of the distal internal carotid lumen as the denominator for stenosis measurement.  The following velocity measurements were obtained:                   PEAK SYSTOLIC/END DIASTOLIC RIGHT ICA:                        93/21cm/sec CCA:                        109/21cm/sec SYSTOLIC ICA/CCA RATIO:     0.85 DIASTOLIC ICA/CCA RATIO:    1.00 ECA:                        133cm/sec  LEFT ICA:                        94/26cm/sec CCA:                        132/20cm/sec SYSTOLIC ICA/CCA RATIO:     0.71 DIASTOLIC ICA/CCA RATIO:    1.29 ECA:                        135cm/sec  Findings:  RIGHT CAROTID ARTERY: Minor atherosclerotic changes.  No hemodynamically significant right ICA stenosis, velocity elevation, or turbulent flow.  Degree of narrowing less than 50%.  RIGHT VERTEBRAL ARTERY:  Antegrade  LEFT CAROTID ARTERY: Minor atherosclerosis.  No hemodynamically significant left ICA stenosis, velocity elevation, or turbulent flow.  LEFT VERTEBRAL ARTERY:  Antegrade  IMPRESSION: Minor carotid atherosclerosis.  No hemodynamically significant ICA stenosis by ultrasound on either side  Original Report Authenticated By: Judie Petit. Ruel Favors, M.D.   Mr Maxine Glenn Head/brain Wo Cm  10/28/2011  *RADIOLOGY REPORT*  Clinical Data:  Left-sided facial weakness and tingling  MRI HEAD WITHOUT CONTRAST MRA HEAD WITHOUT CONTRAST  Technique:  Multiplanar, multiecho pulse sequences of the brain and surrounding structures were obtained without intravenous contrast. Angiographic images of the head were obtained  using MRA technique without contrast.  Comparison:  Head CT same day  MRI HEAD  Findings:  Diffusion imaging does not show any acute or subacute infarction.  The brainstem and cerebellum are normal.  The cerebral hemispheres are normal.  No mass lesion, hemorrhage, hydrocephalus or extra-axial collection.  No pituitary mass.  There are some inflammatory changes of the right maxillary sinus and the ethmoid sinuses.  IMPRESSION: Normal appearance of the brain.  Right maxillary sinus and ethmoid sinus inflammatory changes.  MRA HEAD  Findings: Both internal carotid arteries are widely patent into the brain.  No siphon stenosis.  The anterior and middle cerebral vessels are patent without  proximal stenosis, aneurysm or vascular malformation.  Both vertebral arteries are widely patent to the basilar.  No basilar stenosis.  Posterior circulation branch vessels appear normal.  IMPRESSION: Normal intracranial MR angiography of the large and medium-sized vessels.  Original Report Authenticated By: Thomasenia Sales, M.D.   EKG: Sinus tachycardia with nonspecific changes.  Echocardiogram Left ventricle: The cavity size was normal. Wall thickness was increased in a pattern of mild LVH. Systolic function was normal. The estimated ejection fraction was in the range of 60% to 65%. Wall motion was normal; there were no regional wall motion abnormalities. Doppler parameters are consistent with abnormal left ventricular relaxation (grade 1 diastolic dysfunction). - Aortic valve: Trileaflet; mildly calcified leaflets. - Atrial septum: No obviousdefect or patent foramen ovale was identified. - Tricuspid valve: Trivial regurgitation. - Pericardium, extracardiac: There was no pericardial effusion.  Hospital Course: See H&P for complete admission details. Ms. Rincon is a 1 are old white female who presented with left arm and leg numbness, tingling and weakness. She also had left facial numbness and tingling. In the ER,  she was tachycardic but had otherwise normal vital signs. She was in no acute distress. She had decreased sensation on left face, left pronator drift left arm strength was weak, as was left leg strength. She was unable to perform left finger-to-nose. Her affect was noted to be sad. She was on aspirin prior to admission. Stroke workup was negative. She was seen by neurology who felt that her signs and symptoms were non-anatomic and she had a negative MRI. However he recommended an EEG which is pending at the time of discharge. Her symptoms have improved and she is able to walk to the bathroom and back. I've spoken with physical therapy to report consistent balance problems but less sided strength is adequate. Neurology recommends continuing same medications. Her symptoms have much improved and she may be discharged home. I've asked her to followup with Dr. Gerilyn Pilgrim to followup test results and neurologic exam. I've also referred her to outpatient physical therapy and recommended a cane, per physical therapy's recommendations. Total time on the day of discharge greater than 30 minutes.  Discharge Exam:  Blood pressure 111/70, pulse 90, temperature 98.3 F (36.8 C), temperature source Oral, resp. rate 16, height 5' (1.524 m), weight 90.719 kg (200 lb), SpO2 92.00%.  Neurologic shows inconsistent left-sided weakness with significant give-way and poor effort. Sensation intact   Signed: Kateri Balch L 10/31/2011, 5:07 PM

## 2011-11-05 ENCOUNTER — Ambulatory Visit (HOSPITAL_COMMUNITY): Payer: BC Managed Care – PPO | Admitting: Physical Therapy

## 2011-11-06 NOTE — Progress Notes (Signed)
UR Chart Review Completed  

## 2013-03-13 ENCOUNTER — Other Ambulatory Visit: Payer: Self-pay | Admitting: Obstetrics and Gynecology

## 2013-03-13 DIAGNOSIS — Z139 Encounter for screening, unspecified: Secondary | ICD-10-CM

## 2013-03-20 ENCOUNTER — Ambulatory Visit (HOSPITAL_COMMUNITY)
Admission: RE | Admit: 2013-03-20 | Discharge: 2013-03-20 | Disposition: A | Discharge status: Home / Self Care | Payer: BC Managed Care – PPO | Source: Ambulatory Visit | Attending: Obstetrics and Gynecology | Admitting: Obstetrics and Gynecology

## 2013-03-20 DIAGNOSIS — Z139 Encounter for screening, unspecified: Secondary | ICD-10-CM

## 2013-03-20 DIAGNOSIS — Z1231 Encounter for screening mammogram for malignant neoplasm of breast: Secondary | ICD-10-CM | POA: Insufficient documentation

## 2013-05-10 ENCOUNTER — Encounter: Payer: Self-pay | Admitting: Adult Health

## 2013-05-10 ENCOUNTER — Encounter (INDEPENDENT_AMBULATORY_CARE_PROVIDER_SITE_OTHER): Payer: Self-pay

## 2013-05-10 ENCOUNTER — Ambulatory Visit (INDEPENDENT_AMBULATORY_CARE_PROVIDER_SITE_OTHER): Payer: BC Managed Care – PPO | Admitting: Adult Health

## 2013-05-10 VITALS — BP 128/60 | HR 78 | Ht 60.0 in | Wt 191.0 lb

## 2013-05-10 DIAGNOSIS — Z01419 Encounter for gynecological examination (general) (routine) without abnormal findings: Secondary | ICD-10-CM

## 2013-05-10 DIAGNOSIS — F439 Reaction to severe stress, unspecified: Secondary | ICD-10-CM

## 2013-05-10 DIAGNOSIS — Z1212 Encounter for screening for malignant neoplasm of rectum: Secondary | ICD-10-CM

## 2013-05-10 DIAGNOSIS — G4700 Insomnia, unspecified: Secondary | ICD-10-CM

## 2013-05-10 HISTORY — DX: Insomnia, unspecified: G47.00

## 2013-05-10 HISTORY — DX: Reaction to severe stress, unspecified: F43.9

## 2013-05-10 LAB — HEMOCCULT GUIAC POC 1CARD (OFFICE): Fecal Occult Blood, POC: NEGATIVE

## 2013-05-10 NOTE — Patient Instructions (Signed)
Try 3 mg of melatonin at bedtime Insomnia Insomnia is frequent trouble falling and/or staying asleep. Insomnia can be a long term problem or a short term problem. Both are common. Insomnia can be a short term problem when the wakefulness is related to a certain stress or worry. Long term insomnia is often related to ongoing stress during waking hours and/or poor sleeping habits. Overtime, sleep deprivation itself can make the problem worse. Every little thing feels more severe because you are overtired and your ability to cope is decreased. CAUSES   Stress, anxiety, and depression.  Poor sleeping habits.  Distractions such as TV in the bedroom.  Naps close to bedtime.  Engaging in emotionally charged conversations before bed.  Technical reading before sleep.  Alcohol and other sedatives. They may make the problem worse. They can hurt normal sleep patterns and normal dream activity.  Stimulants such as caffeine for several hours prior to bedtime.  Pain syndromes and shortness of breath can cause insomnia.  Exercise late at night.  Changing time zones may cause sleeping problems (jet lag). It is sometimes helpful to have someone observe your sleeping patterns. They should look for periods of not breathing during the night (sleep apnea). They should also look to see how long those periods last. If you live alone or observers are uncertain, you can also be observed at a sleep clinic where your sleep patterns will be professionally monitored. Sleep apnea requires a checkup and treatment. Give your caregivers your medical history. Give your caregivers observations your family has made about your sleep.  SYMPTOMS   Not feeling rested in the morning.  Anxiety and restlessness at bedtime.  Difficulty falling and staying asleep. TREATMENT   Your caregiver may prescribe treatment for an underlying medical disorders. Your caregiver can give advice or help if you are using alcohol or other  drugs for self-medication. Treatment of underlying problems will usually eliminate insomnia problems.  Medications can be prescribed for short time use. They are generally not recommended for lengthy use.  Over-the-counter sleep medicines are not recommended for lengthy use. They can be habit forming.  You can promote easier sleeping by making lifestyle changes such as:  Using relaxation techniques that help with breathing and reduce muscle tension.  Exercising earlier in the day.  Changing your diet and the time of your last meal. No night time snacks.  Establish a regular time to go to bed.  Counseling can help with stressful problems and worry.  Soothing music and white noise may be helpful if there are background noises you cannot remove.  Stop tedious detailed work at least one hour before bedtime. HOME CARE INSTRUCTIONS   Keep a diary. Inform your caregiver about your progress. This includes any medication side effects. See your caregiver regularly. Take note of:  Times when you are asleep.  Times when you are awake during the night.  The quality of your sleep.  How you feel the next day. This information will help your caregiver care for you.  Get out of bed if you are still awake after 15 minutes. Read or do some quiet activity. Keep the lights down. Wait until you feel sleepy and go back to bed.  Keep regular sleeping and waking hours. Avoid naps.  Exercise regularly.  Avoid distractions at bedtime. Distractions include watching television or engaging in any intense or detailed activity like attempting to balance the household checkbook.  Develop a bedtime ritual. Keep a familiar routine of bathing, brushing your  teeth, climbing into bed at the same time each night, listening to soothing music. Routines increase the success of falling to sleep faster.  Use relaxation techniques. This can be using breathing and muscle tension release routines. It can also include  visualizing peaceful scenes. You can also help control troubling or intruding thoughts by keeping your mind occupied with boring or repetitive thoughts like the old concept of counting sheep. You can make it more creative like imagining planting one beautiful flower after another in your backyard garden.  During your day, work to eliminate stress. When this is not possible use some of the previous suggestions to help reduce the anxiety that accompanies stressful situations. MAKE SURE YOU:   Understand these instructions.  Will watch your condition.  Will get help right away if you are not doing well or get worse. Document Released: 06/12/2000 Document Revised: 09/07/2011 Document Reviewed: 07/13/2007 [facility] Patient Information 2014 [place], [place].  Physical in 1 year Mammogram yearly Colonoscopy 2015 Labs in PCP

## 2013-05-10 NOTE — Progress Notes (Signed)
Patient ID: Kristina Mcdonald, female   DOB: 15-Nov-1955, 57 y.o.   MRN: [id number] History of Present Illness: Kristina Mcdonald is a 57 year old white female, married in for a physical.Had mammogram this year was normal, had colonoscopy in 2010 with polyps removed.Has retinal tear, and needs surgery. Had stroke last year.  Current Medications, Allergies, Past Medical History, Past Surgical History, Family History and Social History were reviewed in [employer] record.   Past Medical History  Diagnosis Date  . Overactive bladder   . Hypertension   . Hypothyroidism 10/2006    Status post thyroidectomy for thyroid mass.  . Hyperlipidemia   . Thrombocytopenia     Chronic  . CAD (coronary artery disease) 02/2001    Nonobstructive per cath.  . Internal hemorrhoids 09/2003  . Chronic anxiety   . Seasonal allergies   . Stroke   . Insomnia 05/10/2013  . Stress 05/10/2013  Current outpatient prescriptions:aspirin 325 MG tablet, Take 325 mg by mouth every morning. , Disp: , Rfl: ;  levothyroxine (SYNTHROID, LEVOTHROID) 100 MCG tablet, Take 100 mcg by mouth every morning. , Disp: , Rfl: ;  losartan-hydrochlorothiazide (HYZAAR) 50-12.5 MG per tablet, Take 1 tablet by mouth every morning. , Disp: , Rfl: ;  Multiple Vitamin (MULITIVITAMIN WITH MINERALS) TABS, Take 1 tablet by mouth every morning. , Disp: , Rfl:  rosuvastatin (CRESTOR) 40 MG tablet, Take 40 mg by mouth at bedtime. , Disp: , Rfl: ;  solifenacin (VESICARE) 10 MG tablet, Take 10 mg by mouth daily., Disp: , Rfl: ;  vitamin B-12 (CYANOCOBALAMIN) 1000 MCG tablet, Take 1,000 mcg by mouth every morning. , Disp: , Rfl: ;  vitamin C (ASCORBIC ACID) 500 MG tablet, Take 500 mg by mouth every morning. , Disp: , Rfl: ;  vitamin E 400 UNIT capsule, Take 400 Units by mouth every morning. , Disp: , Rfl:  ALPRAZolam (XANAX) 0.5 MG tablet, Take 0.5-1 mg by mouth 4 (four) times daily as needed. For anxiety, Disp: , Rfl: ;  cetirizine (ZYRTEC) 10 MG  tablet, Take 10 mg by mouth every morning. , Disp: , Rfl: ;  diphenhydrAMINE (BENADRYL) 25 MG tablet, Take 25 mg by mouth at bedtime as needed. For sleep, Disp: , Rfl:   Past Surgical History  Procedure Laterality Date  . Total abdominal hysterectomy w/ bilateral salpingoophorectomy  09/2001.  Marland Kitchen Thyroidectomy  10/2006    Neg for cancer per patient  . Laparotomy  11/2002    Excision of ovarian remnant  . Appendectomy  11/2002.    Incidental during laparotomy and excision of ovarian remnant.  . Cardiac catheterization  02/2001    Nonobstructive CAD  . Colonoscopy  09/2003    Per Dr. Jena Gauss. Internal hemorrhoids.  Nelle Don ear surgery      ear drum    Review of Systems: Patient denies any headaches, blurred vision, shortness of breath, chest pain, abdominal pain, problems with bowel movements, or intercourse. She has what sounds like mixed incontinence, and she is having problems sleeping.No joint pain and is not moody but is stressed.Has family stress with grandson.    Physical Exam:BP 128/60  Pulse 78  Ht 5' (1.524 m)  Wt 191 lb (86.637 kg)  BMI 37.30 kg/m2 General:  Well developed, well nourished, no acute distress Skin:  Warm and dry Neck:  Midline trachea,thyroid absent Lungs; Clear to auscultation bilaterally Breast:  No dominant palpable mass, retraction, or nipple discharge Cardiovascular: Regular rate and rhythm Abdomen:  Soft, non  tender, no hepatosplenomegaly Pelvic:  External genitalia is normal in appearance.  The vagina is normal in appearance for age.            The cervix and uterus are absent.  No  adnexal masses or tenderness noted. Rectal: Good sphincter tone, no polyps, or hemorrhoids felt.  Hemoccult negative. Extremities:  No swelling or varicosities noted Psych:  No mood changes, alert and cooperative   Impression: Yearly gyn exam no pap Stress Insomnia  Plan: Physical in 1 year Mammogram yearly Colonoscopy 2015 Labs with PCP Try 3 mg melatonin at  hs Follow up with Dr Sherwood Gambler for xanax or SSRI

## 2013-07-20 ENCOUNTER — Telehealth: Payer: Self-pay | Admitting: Adult Health

## 2013-07-20 MED ORDER — ALPRAZOLAM 1 MG PO TABS: 1.0000 mg | ORAL_TABLET | Freq: Three times a day (TID) | ORAL | Status: DC | PRN

## 2013-07-20 NOTE — Telephone Encounter (Signed)
Stressed needs xanax will rx 1 mg tid prn #45 with refill

## 2013-07-20 NOTE — Telephone Encounter (Signed)
Pt states she wanted to speak to [place] to see if she will call in some xanax. Pt states that she is having panic attacks again. Pt states that her grandson's trial starts soon.

## 2013-09-14 ENCOUNTER — Other Ambulatory Visit (HOSPITAL_COMMUNITY): Payer: Self-pay | Admitting: Urology

## 2013-09-14 DIAGNOSIS — R32 Unspecified urinary incontinence: Secondary | ICD-10-CM

## 2013-09-14 DIAGNOSIS — N390 Urinary tract infection, site not specified: Secondary | ICD-10-CM

## 2013-09-25 ENCOUNTER — Ambulatory Visit (HOSPITAL_COMMUNITY)
Admission: RE | Admit: 2013-09-25 | Discharge: 2013-09-25 | Disposition: A | Discharge status: Home / Self Care | Payer: BC Managed Care – PPO | Source: Ambulatory Visit | Attending: Urology | Admitting: Urology

## 2013-09-25 DIAGNOSIS — Z9079 Acquired absence of other genital organ(s): Secondary | ICD-10-CM | POA: Insufficient documentation

## 2013-09-25 DIAGNOSIS — N390 Urinary tract infection, site not specified: Secondary | ICD-10-CM

## 2013-09-25 DIAGNOSIS — R32 Unspecified urinary incontinence: Secondary | ICD-10-CM

## 2013-10-17 ENCOUNTER — Telehealth: Payer: Self-pay | Admitting: Obstetrics and Gynecology

## 2013-10-17 NOTE — Telephone Encounter (Signed)
lmtcb to schedule new patient doctor referral. °

## 2013-10-19 ENCOUNTER — Emergency Department (HOSPITAL_COMMUNITY)
Admission: EM | Admit: 2013-10-19 | Discharge: 2013-10-20 | Disposition: A | Discharge status: Home / Self Care | Payer: BC Managed Care – PPO | Attending: Emergency Medicine | Admitting: Emergency Medicine

## 2013-10-19 ENCOUNTER — Encounter (HOSPITAL_COMMUNITY): Payer: Self-pay | Admitting: Emergency Medicine

## 2013-10-19 ENCOUNTER — Emergency Department (HOSPITAL_COMMUNITY): Payer: BC Managed Care – PPO

## 2013-10-19 DIAGNOSIS — Z9889 Other specified postprocedural states: Secondary | ICD-10-CM | POA: Insufficient documentation

## 2013-10-19 DIAGNOSIS — Z87448 Personal history of other diseases of urinary system: Secondary | ICD-10-CM | POA: Insufficient documentation

## 2013-10-19 DIAGNOSIS — E785 Hyperlipidemia, unspecified: Secondary | ICD-10-CM | POA: Insufficient documentation

## 2013-10-19 DIAGNOSIS — Z7982 Long term (current) use of aspirin: Secondary | ICD-10-CM | POA: Insufficient documentation

## 2013-10-19 DIAGNOSIS — I2510 Atherosclerotic heart disease of native coronary artery without angina pectoris: Secondary | ICD-10-CM | POA: Insufficient documentation

## 2013-10-19 DIAGNOSIS — E039 Hypothyroidism, unspecified: Secondary | ICD-10-CM | POA: Insufficient documentation

## 2013-10-19 DIAGNOSIS — B9789 Other viral agents as the cause of diseases classified elsewhere: Secondary | ICD-10-CM | POA: Insufficient documentation

## 2013-10-19 DIAGNOSIS — B349 Viral infection, unspecified: Secondary | ICD-10-CM

## 2013-10-19 DIAGNOSIS — Z8673 Personal history of transient ischemic attack (TIA), and cerebral infarction without residual deficits: Secondary | ICD-10-CM | POA: Insufficient documentation

## 2013-10-19 DIAGNOSIS — I10 Essential (primary) hypertension: Secondary | ICD-10-CM | POA: Insufficient documentation

## 2013-10-19 DIAGNOSIS — Z79899 Other long term (current) drug therapy: Secondary | ICD-10-CM | POA: Insufficient documentation

## 2013-10-19 DIAGNOSIS — R42 Dizziness and giddiness: Secondary | ICD-10-CM | POA: Insufficient documentation

## 2013-10-19 DIAGNOSIS — D696 Thrombocytopenia, unspecified: Secondary | ICD-10-CM | POA: Insufficient documentation

## 2013-10-19 DIAGNOSIS — F411 Generalized anxiety disorder: Secondary | ICD-10-CM | POA: Insufficient documentation

## 2013-10-19 LAB — COMPREHENSIVE METABOLIC PANEL
ALT: 15 U/L (ref 0–35)
AST: 14 U/L (ref 0–37)
Albumin: 3.8 g/dL (ref 3.5–5.2)
Alkaline Phosphatase: 61 U/L (ref 39–117)
BUN: 14 mg/dL (ref 6–23)
CO2: 30 mEq/L (ref 19–32)
Calcium: 9.4 mg/dL (ref 8.4–10.5)
Chloride: 99 mEq/L (ref 96–112)
Creatinine, Ser: 0.74 mg/dL (ref 0.50–1.10)
GFR calc Af Amer: >90 mL/min (ref >90)
GFR calc non Af Amer: >90 mL/min (ref >90)
Glucose, Bld: 108 mg/dL — ABNORMAL HIGH (ref 70–99)
Potassium: 3.9 mEq/L (ref 3.7–5.3)
Sodium: 140 mEq/L (ref 137–147)
Total Bilirubin: 0.2 mg/dL — ABNORMAL LOW (ref 0.3–1.2)
Total Protein: 7.1 g/dL (ref 6.0–8.3)

## 2013-10-19 LAB — CBC WITH DIFFERENTIAL/PLATELET
Basophils Absolute: 0 10*3/uL (ref 0.0–0.1)
Basophils Relative: 0 % (ref 0–1)
Eosinophils Absolute: 0.1 10*3/uL (ref 0.0–0.7)
Eosinophils Relative: 1 % (ref 0–5)
HCT: 42.2 % (ref 36.0–46.0)
Hemoglobin: 14 g/dL (ref 12.0–15.0)
Lymphocytes Relative: 29 % (ref 12–46)
Lymphs Abs: 2.5 10*3/uL (ref 0.7–4.0)
MCH: 29.9 pg (ref 26.0–34.0)
MCHC: 33.2 g/dL (ref 30.0–36.0)
MCV: 90 fL (ref 78.0–100.0)
Monocytes Absolute: 0.8 10*3/uL (ref 0.1–1.0)
Monocytes Relative: 9 % (ref 3–12)
Neutro Abs: 5.3 10*3/uL (ref 1.7–7.7)
Neutrophils Relative %: 61 % (ref 43–77)
Platelets: ADEQUATE 10*3/uL (ref 150–400)
RBC: 4.69 MIL/uL (ref 3.87–5.11)
RDW: 12.9 % (ref 11.5–15.5)
Smear Review: ADEQUATE
WBC: 8.7 10*3/uL (ref 4.0–10.5)

## 2013-10-19 MED ORDER — SODIUM CHLORIDE 0.9 % IV BOLUS (SEPSIS)
1000.0000 mL | Freq: Once | INTRAVENOUS | Status: AC
  Administered 2013-10-19: 1000 mL via INTRAVENOUS

## 2013-10-19 NOTE — Telephone Encounter (Signed)
lmtcb/[place] °

## 2013-10-19 NOTE — ED Notes (Signed)
Patient c/o feeling fluttering in her chest and states she has a hacking cough.  States has been to her MD 3 times and given steroid shots 3 times.  Patient states she cannot get rid of the cough.

## 2013-10-19 NOTE — Discharge Instructions (Signed)
Drink plenty of fluids.  Take imodium for diarrhea as needed.  Follow up with your md in 1-2 weeks

## 2013-10-19 NOTE — ED Provider Notes (Signed)
CSN: [id number]     Arrival date & time 10/19/13  2126 History  This chart was scribed for Maudry Diego, MD by Zettie Pho, ED Scribe. This patient was seen in room APA14/AP[facility]14 and the patient's care was started at 10:15 PM.    Chief Complaint  Patient presents with  . Palpitations   Patient is a 58 y.o. female presenting with palpitations. The history is provided by the patient. No language interpreter was used.  Palpitations Palpitations quality: fluttering. Onset quality:  Sudden Timing:  Intermittent Progression:  Resolved Chronicity:  New Relieved by:  Nothing Worsened by:  Nothing tried Ineffective treatments:  None tried Associated symptoms: cough   Associated symptoms: no back pain and no chest pain   Cough:    Cough characteristics:  Hacking   Severity:  Moderate   Onset quality:  Gradual   Timing:  Intermittent   Progression:  Unchanged   Chronicity:  New Risk factors: heart disease, hx of thyroid disease and stress   Risk factors: no diabetes mellitus, no hx of atrial fibrillation, no hx of DVT and no hx of PE    HPI Comments: Kristina Mcdonald is a 59 y.o. female with a history of CAD, stress, and chronic anxiety who presents to the Emergency Department complaining of an episode of palpitations, described as fluttering, with associated lightheadedness and elevated blood pressure (180/93 measured at home PTA, 180/72 measured upon arrival to the ED) onset a few hours ago while at rest. She states that this episode lasted about 10 minutes and has since resolved. Patient reports that she had a similar episode yesterday, which also resolved on its own. She reports some watery diarrhea over the past 2 weeks, but she denies any today. Patient reports an associated, hacking cough for the past few days and states that she has followed up with her PCP 3 times for the cough and received prednisone injections each time, but without significant relief of the cough. Patient also has a  history of HTN, hyperlipidemia, hypothyroidism, thrombocytopenia, stroke.   Past Medical History  Diagnosis Date  . Overactive bladder   . Hypertension   . Hypothyroidism 10/2006    Status post thyroidectomy for thyroid mass.  . Hyperlipidemia   . Thrombocytopenia     Chronic  . CAD (coronary artery disease) 02/2001    Nonobstructive per cath.  . Internal hemorrhoids 09/2003  . Chronic anxiety   . Seasonal allergies   . Stroke   . Insomnia 05/10/2013  . Stress 05/10/2013   Past Surgical History  Procedure Laterality Date  . Total abdominal hysterectomy w/ bilateral salpingoophorectomy  09/2001.  [facility] Kitchen Thyroidectomy  10/2006    Neg for cancer per patient  . Laparotomy  11/2002    Excision of ovarian remnant  . Appendectomy  11/2002.    Incidental during laparotomy and excision of ovarian remnant.  . Cardiac catheterization  02/2001    Nonobstructive CAD  . Colonoscopy  09/2003    Per Dr. Gala Romney. Internal hemorrhoids.  [facility] ear surgery      ear drum  . Abdominal hysterectomy     Family History  Problem Relation Age of Onset  . Heart disease Mother     CHF  . Cancer Father     prostate  . Stroke Brother   . Stroke Sister   . Heart disease Sister     CHF  . Multiple sclerosis Sister    History  Substance Use Topics  . Smoking  status: Never Smoker   . Smokeless tobacco: Never Used  . Alcohol Use: No   OB History   Grav Para Term Preterm Abortions TAB SAB Ect Mult Living   2 2        2      Review of Systems  Constitutional: Negative for appetite change and fatigue.  HENT: Negative for congestion, ear discharge and sinus pressure.   Eyes: Negative for discharge.  Respiratory: Positive for cough.   Cardiovascular: Positive for palpitations. Negative for chest pain.  Gastrointestinal: Positive for diarrhea. Negative for abdominal pain.  Genitourinary: Negative for frequency and hematuria.  Musculoskeletal: Negative for back pain.  Skin: Negative for rash.   Neurological: Positive for light-headedness. Negative for seizures and headaches.  Psychiatric/Behavioral: Negative for hallucinations.  All other systems reviewed and are negative.     Allergies  Review of patient's allergies indicates no known allergies.  Home Medications   Prior to Admission medications   Medication Sig Start Date End Date Taking? Authorizing Provider  ALPRAZolam Duanne Moron) 1 MG tablet Take 1 tablet (1 mg total) by mouth 3 (three) times daily as needed for anxiety. 07/20/13   Estill Dooms, NP  aspirin 325 MG tablet Take 325 mg by mouth every morning.     Historical Provider, MD  cetirizine (ZYRTEC) 10 MG tablet Take 10 mg by mouth every morning.     Historical Provider, MD  diphenhydrAMINE (BENADRYL) 25 MG tablet Take 25 mg by mouth at bedtime as needed. For sleep    Historical Provider, MD  levothyroxine (SYNTHROID, LEVOTHROID) 100 MCG tablet Take 100 mcg by mouth every morning.     Historical Provider, MD  losartan-hydrochlorothiazide (HYZAAR) 50-12.5 MG per tablet Take 1 tablet by mouth every morning.     Historical Provider, MD  Multiple Vitamin (MULITIVITAMIN WITH MINERALS) TABS Take 1 tablet by mouth every morning.     Historical Provider, MD  rosuvastatin (CRESTOR) 40 MG tablet Take 40 mg by mouth at bedtime.     Historical Provider, MD  solifenacin (VESICARE) 10 MG tablet Take 10 mg by mouth daily.    Historical Provider, MD  vitamin B-12 (CYANOCOBALAMIN) 1000 MCG tablet Take 1,000 mcg by mouth every morning.     Historical Provider, MD  vitamin C (ASCORBIC ACID) 500 MG tablet Take 500 mg by mouth every morning.     Historical Provider, MD  vitamin E 400 UNIT capsule Take 400 Units by mouth every morning.     Historical Provider, MD   Triage Vitals: BP 180/72  Pulse 102  Temp(Src) 97.8 F (36.6 C) (Oral)  Resp 20  Ht 5' (1.524 m)  Wt 199 lb 9.6 oz (90.538 kg)  BMI 38.98 kg/m2  SpO2 97%  Physical Exam  Constitutional: She is oriented to person,  place, and time. She appears well-developed.  HENT:  Head: Normocephalic.  Eyes: Conjunctivae and EOM are normal. No scleral icterus.  Neck: Neck supple. No thyromegaly present.  Cardiovascular: Normal rate and regular rhythm.  Exam reveals no gallop and no friction rub.   No murmur heard. Pulmonary/Chest: No stridor. She has no wheezes. She has no rales. She exhibits no tenderness.  Abdominal: She exhibits no distension. There is no tenderness. There is no rebound.  Musculoskeletal: Normal range of motion. She exhibits no edema.  Lymphadenopathy:    She has no cervical adenopathy.  Neurological: She is oriented to person, place, and time. She exhibits normal muscle tone. Coordination normal.  Skin: No rash noted. No  erythema.  Psychiatric: She has a normal mood and affect. Her behavior is normal.    ED Course  Procedures (including critical care time)  DIAGNOSTIC STUDIES: Oxygen Saturation is 97% on room air, normal by my interpretation.    COORDINATION OF CARE: 10:18 PM- Ordered CBC, CMP, and an EKG. Ordered IV fluids to manage symptoms.  Discussed treatment plan with patient at bedside and patient verbalized agreement.     Labs Review Labs Reviewed  COMPREHENSIVE METABOLIC PANEL - Abnormal; Notable for the following:    Glucose, Bld 108 (*)    Total Bilirubin 0.2 (*)    All other components within normal limits  CBC WITH DIFFERENTIAL    Imaging Review Dg Abd Acute W/chest  10/19/2013   CLINICAL DATA:  Diarrhea  EXAM: ACUTE ABDOMEN SERIES (ABDOMEN 2 VIEW & CHEST 1 VIEW)  COMPARISON:  US TRANSVAGINAL NON-OB dated 09/25/2013; DG CHEST 1 VIEW dated 10/28/2011  FINDINGS: Normal heart size.  Clear lungs.  No free intraperitoneal gas. No disproportionate dilatation of bowel. Postop changes in the pelvis. No air-fluid levels.  IMPRESSION: No active cardiopulmonary disease. Nonobstructive bowel gas pattern.   Electronically Signed   By: Maryclare Bean M.D.   On: 10/19/2013 23:35     EKG  Interpretation None      MDM   Final diagnoses:  None  The chart was scribed for me under my direct supervision.  I personally performed the history, physical, and medical decision making and all procedures in the evaluation of this patient.Maudry Diego, MD 10/19/13 2350

## 2013-10-23 NOTE — Telephone Encounter (Signed)
lmtcb/[place] °

## 2013-10-26 NOTE — Telephone Encounter (Signed)
lmtcb/[place] °

## 2013-11-07 NOTE — Telephone Encounter (Signed)
Spoke with patient who states she cannot schedule an appointment with [place] at this time. I will notify the referring MD's office. Referral cancelled.

## 2013-11-16 ENCOUNTER — Other Ambulatory Visit: Payer: Self-pay | Admitting: Adult Health

## 2013-11-22 ENCOUNTER — Ambulatory Visit (INDEPENDENT_AMBULATORY_CARE_PROVIDER_SITE_OTHER): Payer: BC Managed Care – PPO | Admitting: Adult Health

## 2013-11-22 ENCOUNTER — Encounter: Payer: Self-pay | Admitting: Adult Health

## 2013-11-22 VITALS — BP 160/64 | Ht 60.0 in | Wt 193.5 lb

## 2013-11-22 DIAGNOSIS — B9689 Other specified bacterial agents as the cause of diseases classified elsewhere: Secondary | ICD-10-CM | POA: Insufficient documentation

## 2013-11-22 DIAGNOSIS — A499 Bacterial infection, unspecified: Secondary | ICD-10-CM

## 2013-11-22 DIAGNOSIS — B370 Candidal stomatitis: Secondary | ICD-10-CM

## 2013-11-22 DIAGNOSIS — N760 Acute vaginitis: Secondary | ICD-10-CM | POA: Insufficient documentation

## 2013-11-22 DIAGNOSIS — N898 Other specified noninflammatory disorders of vagina: Secondary | ICD-10-CM | POA: Insufficient documentation

## 2013-11-22 HISTORY — DX: Other specified bacterial agents as the cause of diseases classified elsewhere: N76.0

## 2013-11-22 HISTORY — DX: Candidal stomatitis: B37.0

## 2013-11-22 HISTORY — DX: Other specified noninflammatory disorders of vagina: N89.8

## 2013-11-22 HISTORY — DX: Other specified bacterial agents as the cause of diseases classified elsewhere: B96.89

## 2013-11-22 LAB — POCT WET PREP (WET MOUNT): WBC, Wet Prep HPF POC: NEGATIVE

## 2013-11-22 MED ORDER — METRONIDAZOLE 500 MG PO TABS: 500.0000 mg | ORAL_TABLET | Freq: Two times a day (BID) | ORAL | Status: DC

## 2013-11-22 MED ORDER — NYSTATIN 100000 UNIT/ML MT SUSP: 5.0000 mL | Freq: Four times a day (QID) | OROMUCOSAL | Status: DC

## 2013-11-22 NOTE — Progress Notes (Signed)
Subjective:     Patient ID: Kristina Mcdonald, female   DOB: April 16, 1956, 58 y.o.   MRN: [id number]  HPI Kristina Mcdonald is a 58 year old white female in complaining of vaginal discharge and odor and tongue sore, was sick in April with URI and took 2 antibiotics and prednisone and cough syrup and then took 2 diflucan.  Review of Systems See HPI Reviewed past medical,surgical, social and family history. Reviewed medications and allergies.     Objective:   Physical Exam BP 160/64  Ht 5' (1.524 m)  Wt 193 lb 8 oz (87.771 kg)  BMI 37.79 kg/m2 Skin warm and dry.Pelvic: external genitalia is normal in appearance, vagina: white discharge with odor, cervix and uterus are absent, adnexa: no masses or tenderness noted. Wet prep: + for clue cells and +WBCs. Has geographic tongue ?thrush     Assessment:     Vaginal discharge BV Thrush     Plan:     Rx flagyl 500 mg 1 bid x 7 days, no alcohol, review handout on BV   Rx nystatin oral susp. Use 5 cc to swish and swallow qid Follow up prn

## 2013-11-22 NOTE — Patient Instructions (Signed)
Thrush, Adult  Thrush, also called oral candidiasis, is a fungal infection that develops in the mouth and throat and on the tongue. It causes white patches to form on the mouth and tongue. Thrush is most common in older adults, but it can occur at any age.  Many cases of thrush are mild, but this infection can also be more serious. Thrush can be a recurring problem for people who have chronic illnesses or who take medicines that limit the body's ability to fight infection. Because these people have difficulty fighting infections, the fungus that causes thrush can spread throughout the body. This can cause life-threatening blood or organ infections. CAUSES  Thrush is usually caused by a yeast called Candida albicans. This fungus is normally present in small amounts in the mouth and on other mucous membranes. It usually causes no harm. However, when conditions are present that allow the fungus to grow uncontrolled, it invades surrounding tissues and becomes an infection. Less often, other Candida species can also lead to thrush.  RISK FACTORS Thrush is more likely to develop in the following people:  People with an impaired ability to fight infection (weakened immune system).   Older adults.   People with HIV.   People with diabetes.   People with dry mouth (xerostomia).   Pregnant women.   People with poor dental care, especially those who have false teeth.   People who use antibiotic medicines.  SIGNS AND SYMPTOMS  Thrush can be a mild infection that causes no symptoms. If symptoms develop, they may include:   A burning feeling in the mouth and throat. This can occur at the start of a thrush infection.   White patches that adhere to the mouth and tongue. The tissue around the patches may be red, raw, and painful. If rubbed (during tooth brushing, for example), the patches and the tissue of the mouth may bleed easily.   A bad taste in the mouth or difficulty tasting foods.    Cottony feeling in the mouth.   Pain during eating and swallowing. DIAGNOSIS  Your health care provider can usually diagnose thrush by looking in your mouth and asking you questions about your health.  TREATMENT  Medicines that help prevent the growth of fungi (antifungals) are the standard treatment for thrush. These medicines are either applied directly to the affected area (topical) or swallowed (oral). The treatment will depend on the severity of the condition.  Mild Thrush Mild cases of thrush may clear up with the use of an antifungal mouth rinse or lozenges. Treatment usually lasts about 14 days.  Moderate to Severe Thrush  More severe thrush infections that have spread to the esophagus are treated with an oral antifungal medicine. A topical antifungal medicine may also be used.   For some severe infections, a treatment period longer than 14 days may be needed.   Oral antifungal medicines are almost never used during pregnancy because the fetus may be harmed. However, if a pregnant woman has a rare, severe thrush infection that has spread to her blood, oral antifungal medicines may be used. In this case, the risk of harm to the mother and fetus from the severe thrush infection may be greater than the risk posed by the use of antifungal medicines.  Persistent or Recurrent Thrush For cases of thrush that do not go away or keep coming back, treatment may involve the following:   Treatment may be needed twice as long as the symptoms last.   Treatment will   include both oral and topical antifungal medicines.   People with weakened immune systems can take an antifungal medicine on a continuous basis to prevent thrush infections.  It is important to treat conditions that make you more likely to get thrush, such as diabetes or HIV.  HOME CARE INSTRUCTIONS   Only take over-the-counter or prescription medicine as directed by your health care provider. Talk to your health care  provider about an over-the-counter medicine called gentian violet, which kills bacteria and fungi.   Eat plain, unflavored yogurt as directed by your health care provider. Check the label to make sure the yogurt contains live cultures. This yogurt can help healthy bacteria grow in the mouth that can stop the growth of the fungus that causes thrush.   Try these measures to help reduce the discomfort of thrush:   Drink cold liquids such as water or iced tea.   Try flavored ice treats or frozen juices.   Eat foods that are easy to swallow, such as gelatin, ice cream, or custard.   If the patches in your mouth are painful, try drinking from a straw.   Rinse your mouth several times a day with a warm saltwater rinse. You can make the saltwater mixture with 1 tsp (6 g) of salt in 8 fl oz (0.2 L) of warm water.   If you wear dentures, remove the dentures before going to bed, brush them vigorously, and soak them in a cleaning solution as directed by your health care provider.   Women who are breastfeeding should clean their nipples with an antifungal medicine as directed by their health care provider. Dry the nipples after breastfeeding. Applying lanolin-containing body lotion may help relieve nipple soreness.  SEEK MEDICAL CARE IF:  Your symptoms are getting worse or are not improving within 7 days of starting treatment.   You have symptoms of spreading infection, such as white patches on the skin outside of the mouth.   You are nursing and you have redness, burning, or pain in the nipples that is not relieved with treatment.  MAKE SURE YOU:  Understand these instructions.  Will watch your condition.  Will get help right away if you are not doing well or get worse. Document Released: 03/10/2004 Document Revised: 04/05/2013 Document Reviewed: 01/16/2013 [facility] Patient Information 2014 [place]. Bacterial Vaginosis Bacterial vaginosis is a vaginal infection that  occurs when the normal balance of bacteria in the vagina is disrupted. It results from an overgrowth of certain bacteria. This is the most common vaginal infection in women of childbearing age. Treatment is important to prevent complications, especially in pregnant women, as it can cause a premature delivery. CAUSES  Bacterial vaginosis is caused by an increase in harmful bacteria that are normally present in smaller amounts in the vagina. Several different kinds of bacteria can cause bacterial vaginosis. However, the reason that the condition develops is not fully understood. RISK FACTORS Certain activities or behaviors can put you at an increased risk of developing bacterial vaginosis, including:  Having a new sex partner or multiple sex partners.  Douching.  Using an intrauterine device (IUD) for contraception. Women do not get bacterial vaginosis from toilet seats, bedding, swimming pools, or contact with objects around them. SIGNS AND SYMPTOMS  Some women with bacterial vaginosis have no signs or symptoms. Common symptoms include:  Grey vaginal discharge.  A fishlike odor with discharge, especially after sexual intercourse.  Itching or burning of the vagina and vulva.  Burning or pain with  urination. DIAGNOSIS  Your health care provider will take a medical history and examine the vagina for signs of bacterial vaginosis. A sample of vaginal fluid may be taken. Your health care provider will look at this sample under a microscope to check for bacteria and abnormal cells. A vaginal pH test may also be done.  TREATMENT  Bacterial vaginosis may be treated with antibiotic medicines. These may be given in the form of a pill or a vaginal cream. A second round of antibiotics may be prescribed if the condition comes back after treatment.  HOME CARE INSTRUCTIONS   Only take over-the-counter or prescription medicines as directed by your health care provider.  If antibiotic medicine was  prescribed, take it as directed. Make sure you finish it even if you start to feel better.  Do not have sex until treatment is completed.  Tell all sexual partners that you have a vaginal infection. They should see their health care provider and be treated if they have problems, such as a mild rash or itching.  Practice safe sex by using condoms and only having one sex partner. SEEK MEDICAL CARE IF:   Your symptoms are not improving after 3 days of treatment.  You have increased discharge or pain.  You have a fever. MAKE SURE YOU:   Understand these instructions.  Will watch your condition.  Will get help right away if you are not doing well or get worse. FOR MORE INFORMATION  Centers for Disease Control and Prevention, Division of STD Prevention: [employer] American Sexual Health Association (ASHA): www.ashastd.org  Document Released: 06/15/2005 Document Revised: 04/05/2013 Document Reviewed: 01/25/2013 [facility] Patient Information 2014 [place]. Follow up prn

## 2013-11-27 ENCOUNTER — Other Ambulatory Visit (HOSPITAL_COMMUNITY): Payer: Self-pay | Admitting: Family Medicine

## 2013-11-27 ENCOUNTER — Ambulatory Visit (HOSPITAL_COMMUNITY)
Admission: RE | Admit: 2013-11-27 | Discharge: 2013-11-27 | Disposition: A | Discharge status: Home / Self Care | Payer: BC Managed Care – PPO | Source: Ambulatory Visit | Attending: Family Medicine | Admitting: Family Medicine

## 2013-11-27 DIAGNOSIS — R112 Nausea with vomiting, unspecified: Secondary | ICD-10-CM

## 2013-11-27 DIAGNOSIS — R159 Full incontinence of feces: Secondary | ICD-10-CM

## 2013-11-27 DIAGNOSIS — R933 Abnormal findings on diagnostic imaging of other parts of digestive tract: Secondary | ICD-10-CM | POA: Insufficient documentation

## 2013-11-27 MED ORDER — IOHEXOL 300 MG/ML  SOLN
100.0000 mL | Freq: Once | INTRAMUSCULAR | Status: AC | PRN
  Administered 2013-11-27: 100 mL via INTRAVENOUS

## 2013-11-29 ENCOUNTER — Ambulatory Visit (INDEPENDENT_AMBULATORY_CARE_PROVIDER_SITE_OTHER): Payer: BC Managed Care – PPO | Admitting: Gastroenterology

## 2013-11-29 ENCOUNTER — Encounter: Payer: Self-pay | Admitting: Gastroenterology

## 2013-11-29 ENCOUNTER — Encounter (HOSPITAL_COMMUNITY): Payer: Self-pay

## 2013-11-29 VITALS — BP 136/67 | HR 87 | Temp 98.0°F | Resp 18 | Ht 60.0 in | Wt 191.0 lb

## 2013-11-29 DIAGNOSIS — R112 Nausea with vomiting, unspecified: Secondary | ICD-10-CM | POA: Insufficient documentation

## 2013-11-29 DIAGNOSIS — R195 Other fecal abnormalities: Secondary | ICD-10-CM | POA: Insufficient documentation

## 2013-11-29 MED ORDER — PEG 3350-KCL-NA BICARB-NACL 420 G PO SOLR: 4000.0000 mL | ORAL | Status: DC

## 2013-11-29 MED ORDER — DICYCLOMINE HCL 10 MG PO CAPS: 10.0000 mg | ORAL_CAPSULE | Freq: Three times a day (TID) | ORAL | Status: DC

## 2013-11-29 NOTE — Assessment & Plan Note (Addendum)
58 year old female with significant change in bowel habits from constipation to now fecal incontinence, diarrhea. No rectal bleeding. Exposure to antibiotics occurred after diarrhea started; some subjective weight loss reported. No significant abdominal pain per se, but she does note LLQ "pressure". Apparently, stool studies done with PCP but not available at this time. Concern for infectious etiology, microscopic colitis, less likely malignancy but unable to exclude. Last colonoscopy 2005 with internal hemorrhoids; due for routine screening colonoscopy at this time actually.   Obtain outside stool studies ASAP. If Cdiff PCR not performed, will order.  Celiac serologies [place] for supportive measures Proceed with TCS with Dr. Gala Romney in near future: the risks, benefits, and alternatives have been discussed with the patient in detail. The patient states understanding and desires to proceed.

## 2013-11-29 NOTE — Progress Notes (Signed)
    Referring Provider: Golding, John C, MD Primary Care Physician:  GOLDING, JOHN CABOT, MD Primary Gastroenterologist:  Dr. Rourk  Chief Complaint  Patient presents with  . Nausea  . Emesis  . Diarrhea    HPI:   Kristina Mcdonald presents today at the request of her PCP secondary to changes in bowel habits. Notes postprandial urgency. Fecal incontinence. Has to wear diapers now. Becomes very nauseated with diarrhea. Symptom onset since March 2015. Abx first of April for URI. Throws up phlegm with diarrhea. No abdominal pain. Just pressure LLQ like something pushing on her. Regurgitating food for awhile. No PPI. Decreased appetite. Scared to eat because she knows she will defecate. States she was seen April 1st by PCP and weighed 205. Now 191. No rectal bleeding. Stool studies performed by PCP but not available at time of visit. Baseline bowel habits mostly every other day. Actually states she used a suppository to have a BM prior to this. Significant change in bowel habits.    CT 6/1 with shotty mesenteric lymph nodes, mesenteric adenitis can't be excluded. Last colonoscopy in 2005 with internal hemorrhoids otherwise normal.   Past Medical History  Diagnosis Date  . Overactive bladder   . Hypertension   . Hypothyroidism 10/2006    Status post thyroidectomy for thyroid mass.  . Hyperlipidemia   . Thrombocytopenia     Chronic  . CAD (coronary artery disease) 02/2001    Nonobstructive per cath.  . Internal hemorrhoids 09/2003  . Chronic anxiety   . Seasonal allergies   . Stroke   . Insomnia 05/10/2013  . Stress 05/10/2013  . Vaginal discharge 11/22/2013  . BV (bacterial vaginosis) 11/22/2013  . Thrush, oral 11/22/2013    Past Surgical History  Procedure Laterality Date  . Total abdominal hysterectomy w/ bilateral salpingoophorectomy  09/2001.  . Thyroidectomy  10/2006    Neg for cancer per patient  . Laparotomy  11/2002    Excision of ovarian remnant  . Appendectomy  11/2002.    Incidental during laparotomy and excision of ovarian remnant.  . Cardiac catheterization  02/2001    Nonobstructive CAD  . Colonoscopy  09/2003    Per Dr. Rourk. Internal hemorrhoids.normal otherwise  . Inner ear surgery      ear drum  . Abdominal hysterectomy      Current Outpatient Prescriptions  Medication Sig Dispense Refill  . albuterol (PROVENTIL HFA;VENTOLIN HFA) 108 (90 BASE) MCG/ACT inhaler Inhale 2 puffs into the lungs every 6 (six) hours as needed for wheezing or shortness of breath.      . ALPRAZolam (XANAX) 1 MG tablet TAKE 1 TABLET BY MOUTH 3 TIMES A DAY AS NEEDED  45 tablet  1  . aspirin EC 81 MG tablet Take 81 mg by mouth daily.      . cetirizine (ZYRTEC) 10 MG tablet Take 10 mg by mouth at bedtime.       . fluticasone (FLONASE) 50 MCG/ACT nasal spray Place 2 sprays into both nostrils daily as needed for allergies or rhinitis.      . levothyroxine (SYNTHROID, LEVOTHROID) 50 MCG tablet Take 50 mcg by mouth daily before breakfast.      . losartan-hydrochlorothiazide (HYZAAR) 50-12.5 MG per tablet Take 1 tablet by mouth every morning.       . Multiple Vitamin (MULITIVITAMIN WITH MINERALS) TABS Take 1 tablet by mouth every morning.       . oxybutynin (DITROPAN-XL) 10 MG 24 hr tablet Take 10 mg   by mouth every morning.      . rosuvastatin (CRESTOR) 40 MG tablet Take 40 mg by mouth daily after supper.       . solifenacin (VESICARE) 10 MG tablet Take 10 mg by mouth daily.      . vitamin B-12 (CYANOCOBALAMIN) 1000 MCG tablet Take 1,000 mcg by mouth every morning.       . vitamin E 400 UNIT capsule Take 800 Units by mouth daily.       . zolpidem (AMBIEN) 5 MG tablet Take 5 mg by mouth at bedtime as needed. For sleep       No current facility-administered medications for this visit.    Allergies as of 11/29/2013  . (No Known Allergies)    Family History  Problem Relation Age of Onset  . Heart disease Mother     CHF  . Cancer Father     prostate  . Stroke Brother   .  Stroke Sister   . Heart disease Sister     CHF  . Multiple sclerosis Sister   . Stroke Daughter     light strokes  . Colon cancer Neg Hx     History   Social History  . Marital Status: Married    Spouse Name: N/A    Number of Children: N/A  . Years of Education: N/A   Occupational History  . [employer]    Social History Main Topics  . Smoking status: Never Smoker   . Smokeless tobacco: Never Used  . Alcohol Use: No  . Drug Use: No  . Sexual Activity: No   Other Topics Concern  . Not on file   Social History Narrative  . No narrative on file    Review of Systems: Gen: see HPI CV: Denies chest pain, heart palpitations, syncope, peripheral edema. Resp: Denies shortness of breath with rest, cough, wheezing GI: see HPI GU : Denies urinary burning, urinary frequency, urinary incontinence.  MS: Denies joint pain, muscle weakness, cramps, limited movement Derm: Denies rash, itching, dry skin Psych: Denies depression, anxiety, confusion or memory loss  Heme: Denies bruising, bleeding, and enlarged lymph nodes.  Physical Exam: BP 136/67  Pulse 87  Temp(Src) 98 F (36.7 C) (Oral)  Resp 18  Ht 5' (1.524 m)  Wt 191 lb (86.637 kg)  BMI 37.30 kg/m2 General:   Alert and oriented. Well-developed, well-nourished, pleasant and cooperative. Head:  Normocephalic and atraumatic. Eyes:  Conjunctiva pink, sclera clear, no icterus.   Conjunctiva pink. Ears:  Normal auditory acuity. Nose:  No deformity, discharge,  or lesions. Mouth:  No deformity or lesions, mucosa pink and moist.  Neck:  Supple, without mass or thyromegaly. Lungs:  Clear to auscultation bilaterally, without wheezing, rales, or rhonchi.  Heart:  S1, S2 present without murmurs noted.  Abdomen:  +BS, soft, non-tender and non-distended. Without mass or HSM. No rebound or guarding. No hernias noted. Rectal:  Deferred  Msk:  Symmetrical without gross deformities. Normal posture. Extremities:  Without  clubbing or edema. Neurologic:  Alert and  oriented x4;  grossly normal neurologically. Skin:  Intact, warm and dry without significant lesions or rashes Cervical Nodes:  No significant cervical adenopathy. Psych:  Alert and cooperative. Normal mood and affect.   

## 2013-11-29 NOTE — Patient Instructions (Signed)
Please have blood work done today. We will call with the results.  I am requesting the stool study results from your doctor. We may need to update these.   I have sent Bentyl to your pharmacy to take with meals and at bedtime. This is for loose stool and urgency with eating. Watch for dry mouth, constipation, dizziness, confusion.   I have also provided Nexium 20 mg, an over the counter strength, to take once daily 30 minutes before breakfast.   Finally, we have scheduled you for a colonoscopy with possible upper endoscopy with Dr. Gala Romney in the near future.

## 2013-11-29 NOTE — Assessment & Plan Note (Signed)
Only occurring with diarrheal episodes. Question related to diarrhea; however, she does note intermittent regurgitation of food while eating. No PPI currently. Will trial Nexium 20 mg daily (samples provided). If no improvement by time of colonoscopy, proceed with EGD to assess for gastritis, esophagitis. Risks and benefits discussed in detail with stated understanding.

## 2013-11-30 LAB — TISSUE TRANSGLUTAMINASE, IGA: Tissue Transglutaminase Ab, IgA: 1.8 U/mL (ref <20)

## 2013-11-30 LAB — IGA: IgA: 180 mg/dL (ref 69–380)

## 2013-11-30 NOTE — Progress Notes (Signed)
cc'd to pcp 

## 2013-12-04 ENCOUNTER — Encounter (HOSPITAL_COMMUNITY)
Admission: RE | Disposition: A | Discharge status: Home / Self Care | Payer: Self-pay | Source: Ambulatory Visit | Attending: Internal Medicine

## 2013-12-04 ENCOUNTER — Encounter (HOSPITAL_COMMUNITY): Payer: Self-pay

## 2013-12-04 ENCOUNTER — Ambulatory Visit (HOSPITAL_COMMUNITY)
Admission: RE | Admit: 2013-12-04 | Discharge: 2013-12-04 | Disposition: A | Discharge status: Home / Self Care | Payer: BC Managed Care – PPO | Source: Ambulatory Visit | Attending: Internal Medicine | Admitting: Internal Medicine

## 2013-12-04 DIAGNOSIS — K2100 Gastro-esophageal reflux disease with esophagitis, without bleeding: Secondary | ICD-10-CM | POA: Insufficient documentation

## 2013-12-04 DIAGNOSIS — I2510 Atherosclerotic heart disease of native coronary artery without angina pectoris: Secondary | ICD-10-CM | POA: Insufficient documentation

## 2013-12-04 DIAGNOSIS — Z8673 Personal history of transient ischemic attack (TIA), and cerebral infarction without residual deficits: Secondary | ICD-10-CM | POA: Insufficient documentation

## 2013-12-04 DIAGNOSIS — R195 Other fecal abnormalities: Secondary | ICD-10-CM

## 2013-12-04 DIAGNOSIS — G4700 Insomnia, unspecified: Secondary | ICD-10-CM | POA: Insufficient documentation

## 2013-12-04 DIAGNOSIS — R112 Nausea with vomiting, unspecified: Secondary | ICD-10-CM

## 2013-12-04 DIAGNOSIS — K259 Gastric ulcer, unspecified as acute or chronic, without hemorrhage or perforation: Secondary | ICD-10-CM

## 2013-12-04 DIAGNOSIS — D696 Thrombocytopenia, unspecified: Secondary | ICD-10-CM | POA: Insufficient documentation

## 2013-12-04 DIAGNOSIS — E890 Postprocedural hypothyroidism: Secondary | ICD-10-CM | POA: Insufficient documentation

## 2013-12-04 DIAGNOSIS — K2960 Other gastritis without bleeding: Secondary | ICD-10-CM

## 2013-12-04 DIAGNOSIS — Z79899 Other long term (current) drug therapy: Secondary | ICD-10-CM | POA: Insufficient documentation

## 2013-12-04 DIAGNOSIS — E785 Hyperlipidemia, unspecified: Secondary | ICD-10-CM | POA: Insufficient documentation

## 2013-12-04 DIAGNOSIS — R1319 Other dysphagia: Secondary | ICD-10-CM

## 2013-12-04 DIAGNOSIS — D126 Benign neoplasm of colon, unspecified: Secondary | ICD-10-CM | POA: Insufficient documentation

## 2013-12-04 DIAGNOSIS — I10 Essential (primary) hypertension: Secondary | ICD-10-CM | POA: Insufficient documentation

## 2013-12-04 DIAGNOSIS — F411 Generalized anxiety disorder: Secondary | ICD-10-CM | POA: Insufficient documentation

## 2013-12-04 DIAGNOSIS — R197 Diarrhea, unspecified: Secondary | ICD-10-CM

## 2013-12-04 DIAGNOSIS — Z7982 Long term (current) use of aspirin: Secondary | ICD-10-CM | POA: Insufficient documentation

## 2013-12-04 DIAGNOSIS — K648 Other hemorrhoids: Secondary | ICD-10-CM | POA: Insufficient documentation

## 2013-12-04 DIAGNOSIS — K2940 Chronic atrophic gastritis without bleeding: Secondary | ICD-10-CM | POA: Insufficient documentation

## 2013-12-04 DIAGNOSIS — K529 Noninfective gastroenteritis and colitis, unspecified: Secondary | ICD-10-CM

## 2013-12-04 DIAGNOSIS — Z86010 Personal history of colonic polyps: Secondary | ICD-10-CM

## 2013-12-04 HISTORY — PX: COLONOSCOPY: SHX5424

## 2013-12-04 HISTORY — PX: ESOPHAGOGASTRODUODENOSCOPY: SHX5428

## 2013-12-04 LAB — CLOSTRIDIUM DIFFICILE BY PCR: Toxigenic C. Difficile by PCR: NEGATIVE

## 2013-12-04 SURGERY — COLONOSCOPY
Anesthesia: Moderate Sedation

## 2013-12-04 MED ORDER — SODIUM CHLORIDE 0.9 % IV SOLN
INTRAVENOUS | Status: DC
  Administered 2013-12-04: 14:00:00 via INTRAVENOUS

## 2013-12-04 MED ORDER — LIDOCAINE VISCOUS 2 % MT SOLN
OROMUCOSAL | Status: AC
  Filled 2013-12-04: qty 15

## 2013-12-04 MED ORDER — MEPERIDINE HCL 100 MG/ML IJ SOLN
INTRAMUSCULAR | Status: DC | PRN
  Administered 2013-12-04: 25 mg via INTRAVENOUS
  Administered 2013-12-04: 50 mg via INTRAVENOUS
  Administered 2013-12-04 (×5): 25 mg via INTRAVENOUS

## 2013-12-04 MED ORDER — LIDOCAINE VISCOUS 2 % MT SOLN
OROMUCOSAL | Status: DC | PRN
  Administered 2013-12-04: 3 mL via OROMUCOSAL

## 2013-12-04 MED ORDER — ONDANSETRON HCL 4 MG/2ML IJ SOLN
INTRAMUSCULAR | Status: AC
  Filled 2013-12-04: qty 2

## 2013-12-04 MED ORDER — ONDANSETRON HCL 4 MG/2ML IJ SOLN
INTRAMUSCULAR | Status: DC | PRN
  Administered 2013-12-04: 4 mg via INTRAVENOUS

## 2013-12-04 MED ORDER — SIMETHICONE 40 MG/0.6ML PO SUSP
ORAL | Status: DC | PRN
  Administered 2013-12-04: 15:00:00

## 2013-12-04 MED ORDER — MIDAZOLAM HCL 5 MG/5ML IJ SOLN
INTRAMUSCULAR | Status: AC
  Filled 2013-12-04: qty 10

## 2013-12-04 MED ORDER — MEPERIDINE HCL 100 MG/ML IJ SOLN
INTRAMUSCULAR | Status: AC
  Filled 2013-12-04: qty 2

## 2013-12-04 MED ORDER — MIDAZOLAM HCL 5 MG/5ML IJ SOLN
INTRAMUSCULAR | Status: DC | PRN
  Administered 2013-12-04 (×2): 2 mg via INTRAVENOUS
  Administered 2013-12-04 (×3): 1 mg via INTRAVENOUS
  Administered 2013-12-04: 2 mg via INTRAVENOUS
  Administered 2013-12-04: 1 mg via INTRAVENOUS

## 2013-12-04 NOTE — Op Note (Signed)
[facility] [address] [place], [address]   COLONOSCOPY PROCEDURE REPORT  PATIENT: Kristina Mcdonald, Kristina Mcdonald  MR#:         [id number] BIRTHDATE: [birth year] , 37  yrs. old GENDER: Female ENDOSCOPIST: R.  Garfield Cornea, MD FACP FACG REFERRED BY:  Sharilyn Sites, M.D. PROCEDURE DATE:  12/04/2013 PROCEDURE:     Ileocolonoscopy with multiple snare polypectomies, segmental biopsies and stool sampling  INDICATIONS: diarrhea; fecal incontinence  INFORMED CONSENT:  The risks, benefits, alternatives and imponderables including but not limited to bleeding, perforation as well as the possibility of a missed lesion have been reviewed.  The potential for biopsy, lesion removal, etc. have also been discussed.  Questions have been answered.  All parties agreeable. Please see the history and physical in the medical record for more information.  MEDICATIONS: Versed 8 mg IV and Demerol 125 mg IV in divided doses. Zofran 4 mg IV.  DESCRIPTION OF PROCEDURE:  After a digital rectal exam was performed, the EC-[id number] ([id number])  colonoscope was advanced from the anus through the rectum and colon to the area of the cecum, ileocecal valve and appendiceal orifice.  The cecum was deeply intubated.  These structures were well-seen and photographed for the record.  From the level of the cecum and ileocecal valve, the scope was slowly and cautiously withdrawn.  The mucosal surfaces were carefully surveyed utilizing scope tip deflection to facilitate fold flattening as needed.  The scope was pulled down into the rectum where a thorough examination  was performed.    FINDINGS:  Adequate preparation.  Normal rectum aside from minimal internal hemorrhoids.  Unable to retroflex because rectal vault was small but was seen well on-face.  The patient had (1) 6 mm p the mid ascending segment in (1) 8 mm polyp in the mid descending segment; otherwise, the remainder of the colonic mucosa appeared normal.  The distal 10 cm of terminal mucosa also appeared normal.  THERAPEUTIC / DIAGNOSTIC MANEUVERS PERFORMED:  The above-mentioned polyps were cold and hot snare removed, respectively. Segment biopsies the ascending and descending segments were taken to evaluate for microscopic colitis. Finally, a fresh stool specimen was submitted for microbiology lab.  COMPLICATIONS: None  CECAL WITHDRAWAL TIME:  11 minutes  IMPRESSION:  Multiple colonic polyps - removed as described above; otherwise, normal ileocolonoscopy  -status post segmental biopsy and stool sampling for C. difficile toxin assay.  RECOMMENDATIONS:  Followup on pending studies. See EGD report.   _______________________________ eSigned:  R. Garfield Cornea, MD FACP [facility] 12/04/2013 3:43 PM   CC:

## 2013-12-04 NOTE — Interval H&P Note (Signed)
History and Physical Interval Note:  02/03/7578 7:28 PM  Kristina Mcdonald  has presented today for surgery, with the diagnosis of [place]  The various methods of treatment have been discussed with the patient and family. After consideration of risks, benefits and other options for treatment, the patient has consented to  Procedure(s) with comments: COLONOSCOPY (N/A) - 2:15 ESOPHAGOGASTRODUODENOSCOPY (EGD) (N/A) as a surgical intervention .  The patient's history has been reviewed, patient examined, no change in status, stable for surgery.  I have reviewed the patient's chart and labs.  Questions were answered to the patient's satisfaction.     Patient reports Nexium has helped but still has significant reflux symptoms and pill/solid food dysphagia. She wants her upper GI tract evaluated and dilation performed if needed. Colonoscopy also today per plan. The risks, benefits, limitations, imponderables and alternatives regarding both EGD and colonoscopy have been reviewed with the patient. Questions have been answered. All parties agreeable.    Cristopher Estimable Najmo Pardue

## 2013-12-04 NOTE — Discharge Instructions (Addendum)
Colonoscopy Discharge Instructions  Read the instructions outlined below and refer to this sheet in the next few weeks. These discharge instructions provide you with general information on caring for yourself after you leave the hospital. Your doctor may also give you specific instructions. While your treatment has been planned according to the most current medical practices available, unavoidable complications occasionally occur. If you have any problems or questions after discharge, call Dr. Gala Romney at [phone]. ACTIVITY  You may resume your regular activity, but move at a slower pace for the next 24 hours.   Take frequent rest periods for the next 24 hours.   Walking will help get rid of the air and reduce the bloated feeling in your belly (abdomen).   No driving for 24 hours (because of the medicine (anesthesia) used during the test).    Do not sign any important legal documents or operate any machinery for 24 hours (because of the anesthesia used during the test).  NUTRITION  Drink plenty of fluids.   You may resume your normal diet as instructed by your doctor.   Begin with a light meal and progress to your normal diet. Heavy or fried foods are harder to digest and may make you feel sick to your stomach (nauseated).   Avoid alcoholic beverages for 24 hours or as instructed.  MEDICATIONS  You may resume your normal medications unless your doctor tells you otherwise.  WHAT YOU CAN EXPECT TODAY  Some feelings of bloating in the abdomen.   Passage of more gas than usual.   Spotting of blood in your stool or on the toilet paper.  IF YOU HAD POLYPS REMOVED DURING THE COLONOSCOPY:  No aspirin products for 7 days or as instructed.   No alcohol for 7 days or as instructed.   Eat a soft diet for the next 24 hours.  FINDING OUT THE RESULTS OF YOUR TEST Not all test results are available during your visit. If your test results are not back during the visit, make an appointment  with your caregiver to find out the results. Do not assume everything is normal if you have not heard from your caregiver or the medical facility. It is important for you to follow up on all of your test results.  SEEK IMMEDIATE MEDICAL ATTENTION IF:  You have more than a spotting of blood in your stool.   Your belly is swollen (abdominal distention).   You are nauseated or vomiting.   You have a temperature over 101.  You have abdominal pain or discomfort that is severe or gets worse throughout the day. EGD Discharge instructions Please read the instructions outlined below and refer to this sheet in the next few weeks. These discharge instructions provide you with general information on caring for yourself after you leave the hospital. Your doctor may also give you specific instructions. While your treatment has been planned according to the most current medical practices available, unavoidable complications occasionally occur. If you have any problems or questions after discharge, please call your doctor. ACTIVITY You may resume your regular activity but move at a slower pace for the next 24 hours.  Take frequent rest periods for the next 24 hours.  Walking will help expel (get rid of) the air and reduce the bloated feeling in your abdomen.  No driving for 24 hours (because of the anesthesia (medicine) used during the test).  You may shower.  Do not sign any important legal documents or operate any machinery for 24  hours (because of the anesthesia used during the test).  NUTRITION Drink plenty of fluids.  You may resume your normal diet.  Begin with a light meal and progress to your normal diet.  Avoid alcoholic beverages for 24 hours or as instructed by your caregiver.  MEDICATIONS You may resume your normal medications unless your caregiver tells you otherwise.  WHAT YOU CAN EXPECT TODAY You may experience abdominal discomfort such as a feeling of fullness or gas pains.   FOLLOW-UP Your doctor will discuss the results of your test with you.  SEEK IMMEDIATE MEDICAL ATTENTION IF ANY OF THE FOLLOWING OCCUR: Excessive nausea (feeling sick to your stomach) and/or vomiting.  Severe abdominal pain a  Esophagogastroduodenoscopy Care After Refer to this sheet in the next few weeks. These instructions provide you with information on caring for yourself after your procedure. Your caregiver may also give you more specific instructions. Your treatment has been planned according to current medical practices, but problems sometimes occur. Call your caregiver if you have any problems or questions after your procedure.  HOME CARE INSTRUCTIONS Do not eat or drink anything until the numbing medicine (local anesthetic) has worn off and your gag reflex has returned. You will know that the local anesthetic has worn off when you can swallow comfortably. Do not drive for 12 hours after the procedure or as directed by your caregiver. Only take medicines as directed by your caregiver. SEEK MEDICAL CARE IF:  You cannot stop coughing. You are not urinating at all or less than usual. SEEK IMMEDIATE MEDICAL CARE IF: You have difficulty swallowing. You cannot eat or drink. You have worsening throat or chest pain. You have dizziness, lightheadedness, or you faint. You have nausea or vomiting. You have chills. You have a fever. You have severe abdominal pain. You have black, tarry, or bloody stools. Document Released: 06/01/2012 Document Reviewed: 06/01/2012 [facility] Patient Information 2014 [place], [place]. nd distention (swelling).  Trouble swallowing.  Temperature over 101 F (37.8 C).  Rectal bleeding or vomiting of blood.    Colon Polyps Polyps are lumps of extra tissue growing inside the body. Polyps can grow in the large intestine (colon). Most colon polyps are noncancerous (benign). However, some colon polyps can become cancerous over time. Polyps that are larger than a  pea may be harmful. To be safe, caregivers remove and test all polyps. CAUSES  Polyps form when mutations in the genes cause your cells to grow and divide even though no more tissue is needed. RISK FACTORS There are a number of risk factors that can increase your chances of getting colon polyps. They include:  Being older than 50 years.  Family history of colon polyps or colon cancer.  Long-term colon diseases, such as colitis or Crohn disease.  Being overweight.  Smoking.  Being inactive.  Drinking too much alcohol. SYMPTOMS  Most small polyps do not cause symptoms. If symptoms are present, they may include:  Blood in the stool. The stool may look dark red or black.  Constipation or diarrhea that lasts longer than 1 week. DIAGNOSIS People often do not know they have polyps until their caregiver finds them during a regular checkup. Your caregiver can use 4 tests to check for polyps:  Digital rectal exam. The caregiver wears gloves and feels inside the rectum. This test would find polyps only in the rectum.  Barium enema. The caregiver puts a liquid called barium into your rectum before taking X-rays of your colon. Barium makes your colon look  white. Polyps are dark, so they are easy to see in the X-ray pictures.  Sigmoidoscopy. A thin, flexible tube (sigmoidoscope) is placed into your rectum. The sigmoidoscope has a light and tiny camera in it. The caregiver uses the sigmoidoscope to look at the last third of your colon.  Colonoscopy. This test is like sigmoidoscopy, but the caregiver looks at the entire colon. This is the most common method for finding and removing polyps. TREATMENT  Any polyps will be removed during a sigmoidoscopy or colonoscopy. The polyps are then tested for cancer. PREVENTION  To help lower your risk of getting more colon polyps:  Eat plenty of fruits and vegetables. Avoid eating fatty foods.  Do not smoke.  Avoid drinking alcohol.  Exercise every  day.  Lose weight if recommended by your caregiver.  Eat plenty of calcium and folate. Foods that are rich in calcium include milk, cheese, and broccoli. Foods that are rich in folate include chickpeas, kidney beans, and spinach. HOME CARE INSTRUCTIONS Keep all follow-up appointments as directed by your caregiver. You may need periodic exams to check for polyps. SEEK MEDICAL CARE IF: You notice bleeding during a bowel movement. Document Released: 03/11/2004 Document Revised: 09/07/2011 Document Reviewed: 08/25/2011 [facility] Patient Information 2014 [place]. Esophageal Dilatation The esophagus is the long, narrow tube which carries food and liquid from the mouth to the stomach. Esophageal dilatation is the technique used to stretch a blocked or narrowed portion of the esophagus. This procedure is used when a part of the esophagus has become so narrow that it becomes difficult, painful or even impossible to swallow. This is generally an uncomplicated form of treatment. When this is not successful, chest surgery may be required. This is a much more extensive form of treatment with a longer recovery time. CAUSES  Some of the more common causes of blockage or strictures of the esophagus are:  Narrowing from longstanding inflammation (soreness and redness) of the lower esophagus. This comes from the constant exposure of the lower esophagus to the acid which bubbles up from the stomach. Over time this causes scarring and narrowing of the lower esophagus.  Hiatal hernia in which a small part of the stomach bulges (herniates) up through the diaphragm. This can cause a gradual narrowing of the end of the esophagus.  Schatzki's Ring is a narrow ring of benign (non-cancerous) fibrous tissue which constricts the lower esophagus. The reason for this is not known.  Scleroderma is a connective tissue disorder that affects the esophagus and makes swallowing difficult.  Achalasia is an absence of  nerves to the lower esophagus and to the esophageal sphincter. This is the circular muscle between the stomach and esophagus that relaxes to allow food into the stomach. After swallowing, it contracts to keep food in the stomach. This absence of nerves may be congenital (present since birth). This can cause irregular spasms of the lower esophageal muscle. This spasm does not open up to allow food and fluid through. The result is a persistent blockage with subsequent slow trickling of the esophageal contents into the stomach.  Strictures may develop from swallowing materials which damage the esophagus. Some examples are strong acids or alkalis such as lye.  Growths such as benign (non-cancerous) and malignant (cancerous) tumors can block the esophagus.  Heredity (present since birth) causes. DIAGNOSIS  Your caregiver often suspects this problem by taking a medical history. They will also do a physical exam. They can then prove their suspicions using X-rays and endoscopy. Endoscopy  is an exam in which a tube like a small flexible telescope is used to look at your esophagus.  TREATMENT There are different stretching (dilating) techniques which can be used. Simple bougie dilatation may be done in the office. This usually takes only a couple minutes. A numbing (anesthetic) spray of the throat is used. Endoscopy, when done, is done in an endoscopy suite, under mild sedation. When fluoroscopy is used, the procedure is performed in X-ray. Other techniques require a little longer time. Recovery is usually quick. There is no waiting time to begin eating and drinking to test success of the treatment. Following are some of the methods used. Narrowing of the esophagus is treated by making it bigger. Commonly this is a mechanical problem which can be treated with stretching. This can be done in different ways. Your caregiver will discuss these with you. Some of the means used are:  A series of graduated (increasing  thickness) flexible dilators can be used. These are weighted tubes passed through the esophagus into the stomach. The tubes used become progressively larger until the desired stretched size is reached. Graduated dilators are a simple and quick way of opening the esophagus. No visualization is required.  Another method is the use of endoscopy to place a flexible wire across the stricture. The endoscope is removed and the wire left in place. A dilator with a hole through it from end to end is guided down the esophagus and across the stricture. One or more of these dilators are passed over the wire. At the end of the exam, the wire is removed. This type of treatment may be performed in the X-ray department under fluoroscopy. An advantage of this procedure is the examiner is visualizing the end opening in the esophagus.  Stretching of the esophagus may be done using balloons. Deflated balloons are placed through the endoscope and across the stricture. This type of balloon dilatation is often done at the time of endoscopy or fluoroscopy. Flexible endoscopy allows the examiner to directly view the stricture. A balloon is inserted in the deflated form into the area of narrowing. It is then inflated with air to a certain pressure that is pre-set for a given circumference. When inflated, it becomes sausage shaped, stretched, and makes the stricture larger.  Achalasia requires a longer larger balloon-type dilator. This is frequently done under X-ray control. In this situation, the spastic muscle fibers in the lower esophagus are stretched. All of the above procedures make the passage of food and water into the stomach easier. They also make it easier for stomach contents to reflux back into the esophagus. Special medications may be used following the procedure to help prevent further stricturing. Proton-pump inhibitor medications are good at decreasing the amount of acid in the stomach juice. When stomach juice  refluxes into the esophagus, the juice is no longer as acidic and is less likely to burn or scar the esophagus. RISKS AND COMPLICATIONS Esophageal dilatation is usually performed effectively and without problems. Some complications that can occur are:  A small amount of bleeding almost always happens where the stretching takes place. If this is too excessive it may require more aggressive treatment.  An uncommon complication is perforation (making a hole) of the esophagus. The esophagus is thin. It is easy to make a hole in it. If this happens, an operation may be necessary to repair this.  A small, undetected perforation could lead to an infection in the chest. This can be very serious. HOME  CARE INSTRUCTIONS   If you received sedation for your procedure, do not drive, make important decisions, or perform any activities requiring your full coordination. Do not drink alcohol, take sedatives, or use any mind altering chemicals unless instructed by your caregiver.  You may use throat lozenges or warm salt water gargles if you have throat discomfort  You can begin eating and drinking normally on return home unless instructed otherwise. Do not purposely try to force large chunks of food down to test the benefits of your procedure.  Mild discomfort can be eased with sips of ice water.  Medications for discomfort may or may not be needed. SEEK IMMEDIATE MEDICAL CARE IF:   You begin vomiting up blood.  You develop black tarry stools  You develop chills or an unexplained temperature of over 101 F (38.3 C)  You develop chest or abdominal pain.  You develop shortness of breath or feel lightheaded or faint.  Your swallowing is becoming more painful, difficult, or you are unable to swallow. MAKE SURE YOU:   Understand these instructions.  Will watch your condition.  Will get help right away if you are not doing well or get worse. Document Released: 08/06/2005 Document Revised:  09/07/2011 Document Reviewed: 09/23/2005 [facility] Patient Information 2014 [place].   Increase Nexium to 40 mg once daily You samples already provided)  Polyp information provided  Further recommendations to follow pending review of pathology report

## 2013-12-04 NOTE — H&P (View-Only) (Signed)
Referring Provider: Halford Chessman, MD Primary Care Physician:  Purvis Kilts, MD Primary Gastroenterologist:  Dr. Gala Romney  Chief Complaint  Patient presents with  . Nausea  . Emesis  . Diarrhea    HPI:   Kristina Mcdonald presents today at the request of her PCP secondary to changes in bowel habits. Notes postprandial urgency. Fecal incontinence. Has to wear diapers now. Becomes very nauseated with diarrhea. Symptom onset since March 2015. Abx first of April for URI. Throws up phlegm with diarrhea. No abdominal pain. Just pressure LLQ like something pushing on her. Regurgitating food for awhile. No PPI. Decreased appetite. Scared to eat because she knows she will defecate. States she was seen April 1st by PCP and weighed 205. Now 191. No rectal bleeding. Stool studies performed by PCP but not available at time of visit. Baseline bowel habits mostly every other day. Actually states she used a suppository to have a BM prior to this. Significant change in bowel habits.    CT 6/1 with shotty mesenteric lymph nodes, mesenteric adenitis can't be excluded. Last colonoscopy in 2005 with internal hemorrhoids otherwise normal.   Past Medical History  Diagnosis Date  . Overactive bladder   . Hypertension   . Hypothyroidism 10/2006    Status post thyroidectomy for thyroid mass.  . Hyperlipidemia   . Thrombocytopenia     Chronic  . CAD (coronary artery disease) 02/2001    Nonobstructive per cath.  . Internal hemorrhoids 09/2003  . Chronic anxiety   . Seasonal allergies   . Stroke   . Insomnia 05/10/2013  . Stress 05/10/2013  . Vaginal discharge 11/22/2013  . BV (bacterial vaginosis) 11/22/2013  . Thrush, oral 11/22/2013    Past Surgical History  Procedure Laterality Date  . Total abdominal hysterectomy w/ bilateral salpingoophorectomy  09/2001.  Marland Kitchen Thyroidectomy  10/2006    Neg for cancer per patient  . Laparotomy  11/2002    Excision of ovarian remnant  . Appendectomy  11/2002.    Incidental during laparotomy and excision of ovarian remnant.  . Cardiac catheterization  02/2001    Nonobstructive CAD  . Colonoscopy  09/2003    Per Dr. Gala Romney. Internal hemorrhoids.normal otherwise  . Inner ear surgery      ear drum  . Abdominal hysterectomy      Current Outpatient Prescriptions  Medication Sig Dispense Refill  . albuterol (PROVENTIL HFA;VENTOLIN HFA) 108 (90 BASE) MCG/ACT inhaler Inhale 2 puffs into the lungs every 6 (six) hours as needed for wheezing or shortness of breath.      . ALPRAZolam (XANAX) 1 MG tablet TAKE 1 TABLET BY MOUTH 3 TIMES A DAY AS NEEDED  45 tablet  1  . aspirin EC 81 MG tablet Take 81 mg by mouth daily.      . cetirizine (ZYRTEC) 10 MG tablet Take 10 mg by mouth at bedtime.       . fluticasone (FLONASE) 50 MCG/ACT nasal spray Place 2 sprays into both nostrils daily as needed for allergies or rhinitis.      Marland Kitchen levothyroxine (SYNTHROID, LEVOTHROID) 50 MCG tablet Take 50 mcg by mouth daily before breakfast.      . losartan-hydrochlorothiazide (HYZAAR) 50-12.5 MG per tablet Take 1 tablet by mouth every morning.       . Multiple Vitamin (MULITIVITAMIN WITH MINERALS) TABS Take 1 tablet by mouth every morning.       Marland Kitchen oxybutynin (DITROPAN-XL) 10 MG 24 hr tablet Take 10 mg  by mouth every morning.      . rosuvastatin (CRESTOR) 40 MG tablet Take 40 mg by mouth daily after supper.       . solifenacin (VESICARE) 10 MG tablet Take 10 mg by mouth daily.      . vitamin B-12 (CYANOCOBALAMIN) 1000 MCG tablet Take 1,000 mcg by mouth every morning.       . vitamin E 400 UNIT capsule Take 800 Units by mouth daily.       Marland Kitchen zolpidem (AMBIEN) 5 MG tablet Take 5 mg by mouth at bedtime as needed. For sleep       No current facility-administered medications for this visit.    Allergies as of 11/29/2013  . (No Known Allergies)    Family History  Problem Relation Age of Onset  . Heart disease Mother     CHF  . Cancer Father     prostate  . Stroke Brother   .  Stroke Sister   . Heart disease Sister     CHF  . Multiple sclerosis Sister   . Stroke Daughter     light strokes  . Colon cancer Neg Hx     History   Social History  . Marital Status: Married    Spouse Name: N/A    Number of Children: N/A  . Years of Education: N/A   Occupational History  . [place] History Main Topics  . Smoking status: Never Smoker   . Smokeless tobacco: Never Used  . Alcohol Use: No  . Drug Use: No  . Sexual Activity: No   Other Topics Concern  . Not on file   Social History Narrative  . No narrative on file    Review of Systems: Gen: see HPI CV: Denies chest pain, heart palpitations, syncope, peripheral edema. Resp: Denies shortness of breath with rest, cough, wheezing GI: see HPI GU : Denies urinary burning, urinary frequency, urinary incontinence.  MS: Denies joint pain, muscle weakness, cramps, limited movement Derm: Denies rash, itching, dry skin Psych: Denies depression, anxiety, confusion or memory loss  Heme: Denies bruising, bleeding, and enlarged lymph nodes.  Physical Exam: BP 136/67  Pulse 87  Temp(Src) 98 F (36.7 C) (Oral)  Resp 18  Ht 5' (1.524 m)  Wt 191 lb (86.637 kg)  BMI 37.30 kg/m2 General:   Alert and oriented. Well-developed, well-nourished, pleasant and cooperative. Head:  Normocephalic and atraumatic. Eyes:  Conjunctiva pink, sclera clear, no icterus.   Conjunctiva pink. Ears:  Normal auditory acuity. Nose:  No deformity, discharge,  or lesions. Mouth:  No deformity or lesions, mucosa pink and moist.  Neck:  Supple, without mass or thyromegaly. Lungs:  Clear to auscultation bilaterally, without wheezing, rales, or rhonchi.  Heart:  S1, S2 present without murmurs noted.  Abdomen:  +BS, soft, non-tender and non-distended. Without mass or HSM. No rebound or guarding. No hernias noted. Rectal:  Deferred  Msk:  Symmetrical without gross deformities. Normal posture. Extremities:  Without  clubbing or edema. Neurologic:  Alert and  oriented x4;  grossly normal neurologically. Skin:  Intact, warm and dry without significant lesions or rashes Cervical Nodes:  No significant cervical adenopathy. Psych:  Alert and cooperative. Normal mood and affect.

## 2013-12-05 ENCOUNTER — Telehealth: Payer: Self-pay | Admitting: Internal Medicine

## 2013-12-05 NOTE — Telephone Encounter (Signed)
Routing to AS 

## 2013-12-05 NOTE — Telephone Encounter (Signed)
Pt has picked up forms. Please see other phone note.

## 2013-12-05 NOTE — Telephone Encounter (Signed)
Forms completed. Return to work tentatively 6/15.

## 2013-12-05 NOTE — Telephone Encounter (Signed)
I have the forms completed as much as I can; I am unable to find the EGD report from 6/8? Only the colonoscopy report. Negative stool studies, negative celiac serologies.   Most importantly, how is patient?

## 2013-12-05 NOTE — Telephone Encounter (Signed)
Pt had her procedure yesterday and was following up on her FMLA papers she left for [place]. I believe I put them in [facility] office (not sure). Please advise patient about going back to work. [phone]

## 2013-12-05 NOTE — Telephone Encounter (Signed)
I spoke with pt and her daughter. Pt is still having uncontrollable diarrhea and she does not know when she is going to have a bm. She is aware of lab work results and cdiff results. FMLA paper work has been given to the pt. We are still waiting for path results and pt is aware of this. Spoke with AS- pt can try immodium prn until we have results back. Informed pt, she said she is taking immodium at this time and it doesn't help. She is very concerned about her inability to tell when she is going to have a BM and is very embarrassed about it and is worried about going back to work and having incontinence. She would like for [place] to call her as soon as results are in if possible.

## 2013-12-05 NOTE — Telephone Encounter (Signed)
Patient is calling today stating she is still having loose uncontrollable stools and shes messing on herself and she doesn't even realize shes done it until she feels her paints wet. Please advise?

## 2013-12-05 NOTE — Telephone Encounter (Signed)
Awaiting final pathology. I will finish the forms for the remainder of this week to be out.

## 2013-12-05 NOTE — Telephone Encounter (Signed)
Pt has called several times today regarding her FMLA forms which are up here now. Pt is now in the waiting room with questions about what she needs to do about going back to work. She had procedure done yesterday and has concerns about having fecal incontinence and would like to speak with the nurse.

## 2013-12-06 NOTE — Op Note (Addendum)
[facility] [address] [place], [address]   ENDOSCOPY PROCEDURE REPORT  PATIENT: Kristina Mcdonald, Kristina Mcdonald  MR#: [id number] BIRTHDATE: 05-29-1956 , 16  yrs. old GENDER: Female ENDOSCOPIST: R.  Garfield Cornea, MD FACP FACG REFERRED BY:  Sharilyn Sites, M.D. PROCEDURE DATE:  12/04/2013 PROCEDURE:     EGD with Venia Minks dilation followed by gastric biopsy  INDICATIONS:     dyspepsia; regurgitation; reflux; esophageal dysphagia  INFORMED CONSENT:   The risks, benefits, limitations, alternatives and imponderables have been discussed.  The potential for biopsy, esophogeal dilation, etc. have also been reviewed.  Questions have been answered.  All parties agreeable.  Please see the history and physical in the medical record for more information.  MEDICATIONS:    Versed 10 mg IV and Demerol 200 mg IV in divided doses. Zofran 4 mg IV. Xylocaine gel orally.  DESCRIPTION OF PROCEDURE:   The EG-[id number] ([id number])  endoscope was introduced through the mouth and advanced to the second portion of the duodenum without difficulty or limitations.  The mucosal surfaces were surveyed very carefully during advancement of the scope and upon withdrawal.  Retroflexion view of the proximal stomach and esophagogastric junction was performed.      FINDINGS: Distal esophageal erosions within 5 mm of the GE junction. No Barrett's esophagus. Tubular esophagus patent throughout its course.  Stomach empty. Focal antral erosions; otherwise, normal appearing gastric mucosa; patent pylorus. Normal first and second portion of the duodenum.  THERAPEUTIC / DIAGNOSTIC MANEUVERS PERFORMED:  A 54 French Maloney dilator was passed to full insertion easily. A look back revealed no apparent complication related to this maneuver. Subsequently, biopsies of the abnormal antrum taken for histologic study.   COMPLICATIONS:  None  IMPRESSION:  Erosive reflux esophagitis-status post passage of a Maloney dilator.  Gastric erosions-status post gastric biopsy  RECOMMENDATIONS:  Followup on pathology. Increase Nexium to 40 mg daily. See colonoscopy report.    _______________________________ R. Garfield Cornea, MD FACP [facility] eSigned:  R. Garfield Cornea, MD FACP [facility] 12/06/2013 2:37 PM     CC:  PATIENT NAME:  Jaanvi, Fizer MR#: [id number]

## 2013-12-07 ENCOUNTER — Encounter (HOSPITAL_COMMUNITY): Payer: Self-pay | Admitting: Internal Medicine

## 2013-12-08 ENCOUNTER — Encounter: Payer: Self-pay | Admitting: Internal Medicine

## 2013-12-08 NOTE — Telephone Encounter (Signed)
Kristina Schwartz, do you have any urgent ov's next week?

## 2013-12-08 NOTE — Telephone Encounter (Signed)
Mild inflammation of the stomach. No H. pylori. No colitis or colon biopsies. Adenomatous polyps celiac and C. difficile negative  Apparently, patient continues to have significant issues, this justifies an expedited office followup next week with extender.

## 2013-12-08 NOTE — Telephone Encounter (Signed)
Pt has ov with AS on Tuesday 12/12/13

## 2013-12-12 ENCOUNTER — Telehealth: Payer: Self-pay | Admitting: Internal Medicine

## 2013-12-12 ENCOUNTER — Encounter: Payer: Self-pay | Admitting: Gastroenterology

## 2013-12-12 ENCOUNTER — Ambulatory Visit (INDEPENDENT_AMBULATORY_CARE_PROVIDER_SITE_OTHER): Payer: BC Managed Care – PPO | Admitting: Gastroenterology

## 2013-12-12 VITALS — BP 134/71 | HR 84 | Temp 98.1°F | Ht 60.0 in | Wt 191.2 lb

## 2013-12-12 DIAGNOSIS — R197 Diarrhea, unspecified: Secondary | ICD-10-CM

## 2013-12-12 MED ORDER — CHOLESTYRAMINE LIGHT 4 G PO PACK: 2.0000 g | PACK | Freq: Every day | ORAL | Status: DC

## 2013-12-12 MED ORDER — ESOMEPRAZOLE MAGNESIUM 40 MG PO CPDR: 40.0000 mg | DELAYED_RELEASE_CAPSULE | Freq: Every day | ORAL | Status: DC

## 2013-12-12 NOTE — Progress Notes (Signed)
Referring Provider: Halford Chessman, MD Primary Care Physician:  Purvis Kilts, MD Primary GI: Dr. Gala Romney   Chief Complaint  Patient presents with  . Diarrhea    HPI:   Presents in follow-up after TCS/EGD for chronic diarrhea. Negative stool studies, celiac disease. Negative colonic biopsies. Imodium helps if takes 2 a day. Breakfast went straight through her. Wearing a pamper today because not sure when it's going to come. Feels like she has no sensation of when she needs to defecate. No urinary symptoms. Feels pressure on left side of abdomen. Stool has a rotten smell. No abdominal pain. Postprandial component. Denies greasy-appearing stool. Stool has more texture. No improvement with Bentyl.   Past Medical History  Diagnosis Date  . Overactive bladder   . Hypertension   . Hypothyroidism 10/2006    Status post thyroidectomy for thyroid mass.  . Hyperlipidemia   . Thrombocytopenia     Chronic  . CAD (coronary artery disease) 02/2001    Nonobstructive per cath.  . Internal hemorrhoids 09/2003  . Chronic anxiety   . Seasonal allergies   . Stroke   . Insomnia 05/10/2013  . Stress 05/10/2013  . Vaginal discharge 11/22/2013  . BV (bacterial vaginosis) 11/22/2013  . Thrush, oral 11/22/2013  . Tubular adenoma     Past Surgical History  Procedure Laterality Date  . Total abdominal hysterectomy w/ bilateral salpingoophorectomy  09/2001.  Marland Kitchen Thyroidectomy  10/2006    Neg for cancer per patient  . Laparotomy  11/2002    Excision of ovarian remnant  . Appendectomy  11/2002.    Incidental during laparotomy and excision of ovarian remnant.  . Cardiac catheterization  02/2001    Nonobstructive CAD  . Colonoscopy  09/2003    Per Dr. Gala Romney. Internal hemorrhoids.normal otherwise  . Inner ear surgery      ear drum  . Abdominal hysterectomy    . Colonoscopy N/A 12/04/2013    Dr. Gala Romney- normal rectum aside from minimal internal hemorrhoids. the patient had a 19mm polyp in the mid  ascending segment and an 79mm poylp in the mid descending segment o/w the remaider of the colonic mucosa appeared normal. the distal 10cm of terminal mucosa also appeared normal.tubular adenoma on bx  . Esophagogastroduodenoscopy N/A 12/04/2013    Dr.Rourk- distal esophageal erosions w/i 4mm of the GE junction. no basrrett's esophagus. tubular esophagus patent throughout its course. focal antral erosions o/w normal appearing gastric mucosa. patent pylorus, normal first and second portion of the duodenum. mild chronic inflammation on bx    Current Outpatient Prescriptions  Medication Sig Dispense Refill  . ALPRAZolam (XANAX) 1 MG tablet TAKE 1 TABLET BY MOUTH 3 TIMES A DAY AS NEEDED  45 tablet  1  . aspirin EC 81 MG tablet Take 81 mg by mouth daily.      . cetirizine (ZYRTEC) 10 MG tablet Take 10 mg by mouth at bedtime.       . dicyclomine (BENTYL) 10 MG capsule Take 1 capsule (10 mg total) by mouth 4 (four) times daily -  before meals and at bedtime.  120 capsule  3  . fluticasone (FLONASE) 50 MCG/ACT nasal spray Place 2 sprays into both nostrils daily as needed for allergies or rhinitis.      Marland Kitchen levothyroxine (SYNTHROID, LEVOTHROID) 50 MCG tablet Take 50 mcg by mouth daily before breakfast.      . losartan-hydrochlorothiazide (HYZAAR) 50-12.5 MG per tablet Take 1 tablet by mouth every morning.       Marland Kitchen  Multiple Vitamin (MULITIVITAMIN WITH MINERALS) TABS Take 1 tablet by mouth every morning.       Marland Kitchen oxybutynin (DITROPAN-XL) 10 MG 24 hr tablet Take 10 mg by mouth every morning.      . pravastatin (PRAVACHOL) 40 MG tablet Take 1 tablet by mouth daily.      . vitamin B-12 (CYANOCOBALAMIN) 1000 MCG tablet Take 1,000 mcg by mouth every morning.       . vitamin E 400 UNIT capsule Take 800 Units by mouth daily.       Marland Kitchen zolpidem (AMBIEN) 5 MG tablet Take 5 mg by mouth at bedtime as needed. For sleep      . cholestyramine light (PREVALITE) 4 G packet Take 0.5 packets (2 g total) by mouth daily.  30 packet  5    . esomeprazole (NEXIUM) 40 MG capsule Take 1 capsule (40 mg total) by mouth daily before breakfast.  30 capsule  11  . pantoprazole (PROTONIX) 40 MG tablet Take 1 tablet (40 mg total) by mouth daily.  90 tablet  3   No current facility-administered medications for this visit.    Allergies as of 12/12/2013  . (No Known Allergies)    Family History  Problem Relation Age of Onset  . Heart disease Mother     CHF  . Cancer Father     prostate  . Stroke Brother   . Stroke Sister   . Heart disease Sister     CHF  . Multiple sclerosis Sister   . Stroke Daughter     light strokes  . Colon cancer Neg Hx     History   Social History  . Marital Status: Married    Spouse Name: N/A    Number of Children: N/A  . Years of Education: N/A   Occupational History  . [place] History Main Topics  . Smoking status: Never Smoker   . Smokeless tobacco: Never Used     Comment: Never smoker  . Alcohol Use: No  . Drug Use: No  . Sexual Activity: No   Other Topics Concern  . None   Social History Narrative  . None    Review of Systems: As mentioned in HPI.   Physical Exam: BP 134/71  Pulse 84  Temp(Src) 98.1 F (36.7 C) (Oral)  Ht 5' (1.524 m)  Wt 191 lb 3.2 oz (86.728 kg)  BMI 37.34 kg/m2 General:   Alert and oriented. No distress noted. Pleasant and cooperative.  Head:  Normocephalic and atraumatic. Abdomen:  +BS, soft, non-tender and non-distended. No rebound or guarding. No HSM or masses noted. Msk:  Symmetrical without gross deformities. Normal posture. Extremities:  Without edema. Neurologic:  Alert and  oriented x4;  grossly normal neurologically. Skin:  Intact without significant lesions or rashes. Psych:  Alert and cooperative. Normal mood and affect.

## 2013-12-12 NOTE — Patient Instructions (Signed)
Please complete the stool sample and return to the lab.   Then, you may start taking 2grams of Questran daily. Make sure that any oral medications are taken at least an hour before Questran. If you take any medications after Questran, it has to be at least 4 hours.   Kefir is found at the grocery store in the yogurt section. Take a serving of this daily. Some other examples you can use for probiotics include Digestive Advantage, [employer] Health, [place], and Align.

## 2013-12-12 NOTE — Telephone Encounter (Signed)
Pt was seen today by AS. Pt called this afternoon to let us know that her insurance will not cover Nexium, but will cover protonix, lansoprazole, lansopale, pantoprazole. She uses [facility]. Please advise

## 2013-12-13 ENCOUNTER — Telehealth: Payer: Self-pay | Admitting: Internal Medicine

## 2013-12-13 MED ORDER — PANTOPRAZOLE SODIUM 40 MG PO TBEC: 40.0000 mg | DELAYED_RELEASE_TABLET | Freq: Every day | ORAL | Status: DC

## 2013-12-13 NOTE — Telephone Encounter (Signed)
Please advise. Patient has called back this afternoon to follow up on this from yesterday.

## 2013-12-13 NOTE — Telephone Encounter (Signed)
I sent in Protonix. Needs to take 1 each morning, 30 minutes before breakfast.

## 2013-12-13 NOTE — Telephone Encounter (Signed)
Addressed.

## 2013-12-13 NOTE — Telephone Encounter (Signed)
Pt has called again today. She said that her insurance will not cover her Nexium and needs something else called in. Please advise and call patient back. [phone]

## 2013-12-14 DIAGNOSIS — R197 Diarrhea, unspecified: Secondary | ICD-10-CM | POA: Insufficient documentation

## 2013-12-14 NOTE — Assessment & Plan Note (Addendum)
Negative TCS/EGD, no evidence of celiac disease or infectious etiology. No concerning signs. Will obtain fecal elastase test and then trial off-label Questran 2 grams daily. If no improvement with this, may need tertiary care referral. Patient quite concerned about lack of anal sphincter control; hopefully, with addition of Questran, she will have more productive, formed BMs resulting in resolution of incontinence.

## 2013-12-18 NOTE — Progress Notes (Signed)
cc'd to pcp 

## 2013-12-22 LAB — PANCREATIC ELASTASE, FECAL: Pancreatic Elastase-1, Stool: >500 mcg/g

## 2013-12-25 ENCOUNTER — Telehealth: Payer: Self-pay | Admitting: *Deleted

## 2013-12-25 NOTE — Progress Notes (Signed)
Quick Note:  No pancreatic insufficiency.  How is she doing with Questran? ______

## 2013-12-25 NOTE — Telephone Encounter (Signed)
Pt called stating her short term disability needs a call verifying when she came out of work and when she returned, they will not release her short term disability check until they talk to our office, Please advise disability [phone] Fax # [phone]. Please advise pt after you talk with them [phone].

## 2013-12-28 NOTE — Telephone Encounter (Signed)
Correction, nurses name that I talked to was [facility].

## 2013-12-28 NOTE — Telephone Encounter (Signed)
I tried to call pt- LMOM. I called her [place], said the pts check was released on 12/17/13 and if pt needed to be out of work longer than that we would need to fill out some additional paperwork which she will fax to [place]. Pt should have went back to work on 12/18/13

## 2014-02-02 ENCOUNTER — Other Ambulatory Visit: Payer: Self-pay | Admitting: Adult Health

## 2014-03-06 ENCOUNTER — Other Ambulatory Visit: Payer: Self-pay | Admitting: Obstetrics and Gynecology

## 2014-03-06 DIAGNOSIS — Z1231 Encounter for screening mammogram for malignant neoplasm of breast: Secondary | ICD-10-CM

## 2014-03-26 ENCOUNTER — Ambulatory Visit (HOSPITAL_COMMUNITY)
Admission: RE | Admit: 2014-03-26 | Discharge: 2014-03-26 | Disposition: A | Discharge status: Home / Self Care | Payer: BC Managed Care – PPO | Source: Ambulatory Visit | Attending: Obstetrics and Gynecology | Admitting: Obstetrics and Gynecology

## 2014-03-26 DIAGNOSIS — Z1231 Encounter for screening mammogram for malignant neoplasm of breast: Secondary | ICD-10-CM | POA: Insufficient documentation

## 2014-04-30 ENCOUNTER — Encounter: Payer: Self-pay | Admitting: Gastroenterology

## 2014-06-04 ENCOUNTER — Other Ambulatory Visit: Payer: Self-pay

## 2014-06-05 ENCOUNTER — Other Ambulatory Visit: Payer: Self-pay

## 2014-06-05 MED ORDER — CHOLESTYRAMINE LIGHT 4 G PO PACK: 2.0000 g | PACK | Freq: Every day | ORAL | Status: DC

## 2014-10-09 ENCOUNTER — Ambulatory Visit (INDEPENDENT_AMBULATORY_CARE_PROVIDER_SITE_OTHER): Payer: BLUE CROSS/BLUE SHIELD | Admitting: Adult Health

## 2014-10-09 ENCOUNTER — Telehealth: Payer: Self-pay | Admitting: Adult Health

## 2014-10-09 ENCOUNTER — Encounter: Payer: Self-pay | Admitting: Adult Health

## 2014-10-09 VITALS — BP 148/68 | HR 84 | Ht 60.0 in | Wt 183.5 lb

## 2014-10-09 DIAGNOSIS — B370 Candidal stomatitis: Secondary | ICD-10-CM

## 2014-10-09 DIAGNOSIS — A499 Bacterial infection, unspecified: Secondary | ICD-10-CM

## 2014-10-09 DIAGNOSIS — N760 Acute vaginitis: Secondary | ICD-10-CM

## 2014-10-09 DIAGNOSIS — N9489 Other specified conditions associated with female genital organs and menstrual cycle: Secondary | ICD-10-CM | POA: Diagnosis not present

## 2014-10-09 DIAGNOSIS — B9689 Other specified bacterial agents as the cause of diseases classified elsewhere: Secondary | ICD-10-CM

## 2014-10-09 DIAGNOSIS — R1032 Left lower quadrant pain: Secondary | ICD-10-CM | POA: Diagnosis not present

## 2014-10-09 DIAGNOSIS — N898 Other specified noninflammatory disorders of vagina: Secondary | ICD-10-CM

## 2014-10-09 HISTORY — DX: Other specified noninflammatory disorders of vagina: N89.8

## 2014-10-09 HISTORY — DX: Left lower quadrant pain: R10.32

## 2014-10-09 LAB — POCT URINALYSIS DIPSTICK
Blood, UA: NEGATIVE
Glucose, UA: NEGATIVE
Nitrite, UA: NEGATIVE
Protein, UA: NEGATIVE

## 2014-10-09 LAB — POCT WET PREP (WET MOUNT)

## 2014-10-09 MED ORDER — NYSTATIN 100000 UNIT/ML MT SUSP: 5.0000 mL | Freq: Four times a day (QID) | OROMUCOSAL | Status: DC

## 2014-10-09 MED ORDER — NYSTATIN-TRIAMCINOLONE 100000-0.1 UNIT/GM-% EX OINT: 1.0000 "application " | TOPICAL_OINTMENT | Freq: Two times a day (BID) | CUTANEOUS | Status: DC

## 2014-10-09 MED ORDER — METRONIDAZOLE 0.75 % VA GEL: 1.0000 | Freq: Every day | VAGINAL | Status: DC

## 2014-10-09 NOTE — Patient Instructions (Signed)
Pelvic Pain Female pelvic pain can be caused by many different things and start from a variety of places. Pelvic pain refers to pain that is located in the lower half of the abdomen and between your hips. The pain may occur over a short period of time (acute) or may be reoccurring (chronic). The cause of pelvic pain may be related to disorders affecting the female reproductive organs (gynecologic), but it may also be related to the bladder, kidney stones, an intestinal complication, or muscle or skeletal problems. Getting help right away for pelvic pain is important, especially if there has been severe, sharp, or a sudden onset of unusual pain. It is also important to get help right away because some types of pelvic pain can be life threatening.  CAUSES  Below are only some of the causes of pelvic pain. The causes of pelvic pain can be in one of several categories.   Gynecologic.  Pelvic inflammatory disease.  Sexually transmitted infection.  Ovarian cyst or a twisted ovarian ligament (ovarian torsion).  Uterine lining that grows outside the uterus (endometriosis).  Fibroids, cysts, or tumors.  Ovulation.  Pregnancy.  Pregnancy that occurs outside the uterus (ectopic pregnancy).  Miscarriage.  Labor.  Abruption of the placenta or ruptured uterus.  Infection.  Uterine infection (endometritis).  Bladder infection.  Diverticulitis.  Miscarriage related to a uterine infection (septic abortion).  Bladder.  Inflammation of the bladder (cystitis).  Kidney stone(s).  Gastrointestinal.  Constipation.  Diverticulitis.  Neurologic.  Trauma.  Feeling pelvic pain because of mental or emotional causes (psychosomatic).  Cancers of the bowel or pelvis. EVALUATION  Your caregiver will want to take a careful history of your concerns. This includes recent changes in your health, a careful gynecologic history of your periods (menses), and a sexual history. Obtaining your family  history and medical history is also important. Your caregiver may suggest a pelvic exam. A pelvic exam will help identify the location and severity of the pain. It also helps in the evaluation of which organ system may be involved. In order to identify the cause of the pelvic pain and be properly treated, your caregiver may order tests. These tests may include:   A pregnancy test.  Pelvic ultrasonography.  An X-ray exam of the abdomen.  A urinalysis or evaluation of vaginal discharge.  Blood tests. HOME CARE INSTRUCTIONS   Only take over-the-counter or prescription medicines for pain, discomfort, or fever as directed by your caregiver.   Rest as directed by your caregiver.   Eat a balanced diet.   Drink enough fluids to make your urine clear or pale yellow, or as directed.   Avoid sexual intercourse if it causes pain.   Apply warm or cold compresses to the lower abdomen depending on which one helps the pain.   Avoid stressful situations.   Keep a journal of your pelvic pain. Write down when it started, where the pain is located, and if there are things that seem to be associated with the pain, such as food or your menstrual cycle.  Follow up with your caregiver as directed.  SEEK MEDICAL CARE IF:  Your medicine does not help your pain.  You have abnormal vaginal discharge. SEEK IMMEDIATE MEDICAL CARE IF:   You have heavy bleeding from the vagina.   Your pelvic pain increases.   You feel light-headed or faint.   You have chills.   You have pain with urination or blood in your urine.   You have uncontrolled diarrhea   or vomiting.   You have a fever or persistent symptoms for more than 3 days.  You have a fever and your symptoms suddenly get worse.   You are being physically or sexually abused.  MAKE SURE YOU:  Understand these instructions.  Will watch your condition.  Will get help if you are not doing well or get worse. Document Released:  05/12/2004 Document Revised: 10/30/2013 Document Reviewed: 10/05/2011 [facility] Patient Information 2015 [place], [place]. This information is not intended to replace advice given to you by your health care provider. Make sure you discuss any questions you have with your health care provider. Bacterial Vaginosis Bacterial vaginosis is a vaginal infection that occurs when the normal balance of bacteria in the vagina is disrupted. It results from an overgrowth of certain bacteria. This is the most common vaginal infection in women of childbearing age. Treatment is important to prevent complications, especially in pregnant women, as it can cause a premature delivery. CAUSES  Bacterial vaginosis is caused by an increase in harmful bacteria that are normally present in smaller amounts in the vagina. Several different kinds of bacteria can cause bacterial vaginosis. However, the reason that the condition develops is not fully understood. RISK FACTORS Certain activities or behaviors can put you at an increased risk of developing bacterial vaginosis, including:  Having a new sex partner or multiple sex partners.  Douching.  Using an intrauterine device (IUD) for contraception. Women do not get bacterial vaginosis from toilet seats, bedding, swimming pools, or contact with objects around them. SIGNS AND SYMPTOMS  Some women with bacterial vaginosis have no signs or symptoms. Common symptoms include:  Grey vaginal discharge.  A fishlike odor with discharge, especially after sexual intercourse.  Itching or burning of the vagina and vulva.  Burning or pain with urination. DIAGNOSIS  Your health care provider will take a medical history and examine the vagina for signs of bacterial vaginosis. A sample of vaginal fluid may be taken. Your health care provider will look at this sample under a microscope to check for bacteria and abnormal cells. A vaginal pH test may also be done.  TREATMENT  Bacterial  vaginosis may be treated with antibiotic medicines. These may be given in the form of a pill or a vaginal cream. A second round of antibiotics may be prescribed if the condition comes back after treatment.  HOME CARE INSTRUCTIONS   Only take over-the-counter or prescription medicines as directed by your health care provider.  If antibiotic medicine was prescribed, take it as directed. Make sure you finish it even if you start to feel better.  Do not have sex until treatment is completed.  Tell all sexual partners that you have a vaginal infection. They should see their health care provider and be treated if they have problems, such as a mild rash or itching.  Practice safe sex by using condoms and only having one sex partner. SEEK MEDICAL CARE IF:   Your symptoms are not improving after 3 days of treatment.  You have increased discharge or pain.  You have a fever. MAKE SURE YOU:   Understand these instructions.  Will watch your condition.  Will get help right away if you are not doing well or get worse. FOR MORE INFORMATION  Centers for Disease Control and Prevention, Division of STD Prevention: AppraiserFraud.fi American Sexual Health Association (ASHA): www.ashastd.org  Document Released: 06/15/2005 Document Revised: 04/05/2013 Document Reviewed: 01/25/2013 [facility] Patient Information 2015 [place], [place]. This information is not intended to replace  advice given to you by your health care provider. Make sure you discuss any questions you have with your health care provider. Get [place] 4/22 at 3:30 at [facility] Use meds Push fluids

## 2014-10-09 NOTE — Telephone Encounter (Signed)
Will refill mytrex

## 2014-10-09 NOTE — Progress Notes (Addendum)
Subjective:     Patient ID: Kristina Mcdonald, female   DOB: 02-12-1956, 59 y.o.   MRN: [id number]  HPI Kristina Mcdonald is a 59 year old white female in complaining of vaginal odor and ?thrush, has white on tongue, has taken antibiotics for UTI in March and has pain/pressure in LLQ for 2 months.Also took diflucan.She is leaving for beach Thursday.  Review of Systems  See HPI for positives, all other systems negative  Reviewed past medical,surgical, social and family history. Reviewed medications and allergies.     Objective:   Physical Exam BP 148/68 mmHg  Pulse 84  Ht 5' (1.524 m)  Wt 183 lb 8 oz (83.235 kg)  BMI 35.84 kg/m2urine trace leuks, Skin warm and dry.Pelvic: external genitalia is normal in appearance no lesions, vagina: white discharge with odor,urethra has no masses noted, cervix and uterus are absent, adnexa: no masses, LLQ tenderness noted. Bladder is non tender and no masses felt. Wet prep: + for clue cells and +WBC, tongue does have white areas present..    Assessment:     Vaginal odor Oral thrush BV  LLQ pain    Plan:    Review handout on BV and pelvic pain Rx metrogel use 1 applicator in vagina at HS Rx nystatin oral susp use qid to swish and swallow  Get gyn [place]  4/22 at 3:30 pm at [facility] Will talk after [place]

## 2014-10-19 ENCOUNTER — Ambulatory Visit (HOSPITAL_COMMUNITY): Payer: BLUE CROSS/BLUE SHIELD

## 2014-10-19 ENCOUNTER — Ambulatory Visit (HOSPITAL_COMMUNITY): Admission: RE | Admit: 2014-10-19 | Payer: BLUE CROSS/BLUE SHIELD | Source: Ambulatory Visit

## 2015-05-20 ENCOUNTER — Other Ambulatory Visit: Payer: Self-pay | Admitting: Adult Health

## 2015-05-20 DIAGNOSIS — Z1231 Encounter for screening mammogram for malignant neoplasm of breast: Secondary | ICD-10-CM

## 2015-05-27 ENCOUNTER — Ambulatory Visit (HOSPITAL_COMMUNITY)
Admission: RE | Admit: 2015-05-27 | Discharge: 2015-05-27 | Disposition: A | Discharge status: Home / Self Care | Payer: BLUE CROSS/BLUE SHIELD | Source: Ambulatory Visit | Attending: Adult Health | Admitting: Adult Health

## 2015-05-27 DIAGNOSIS — Z1231 Encounter for screening mammogram for malignant neoplasm of breast: Secondary | ICD-10-CM | POA: Diagnosis present

## 2015-07-10 ENCOUNTER — Encounter: Payer: Self-pay | Admitting: *Deleted

## 2015-07-24 ENCOUNTER — Emergency Department (HOSPITAL_COMMUNITY): Payer: BLUE CROSS/BLUE SHIELD

## 2015-07-24 ENCOUNTER — Emergency Department (HOSPITAL_COMMUNITY)
Admission: EM | Admit: 2015-07-24 | Discharge: 2015-07-24 | Disposition: A | Discharge status: Home / Self Care | Payer: BLUE CROSS/BLUE SHIELD | Attending: Emergency Medicine | Admitting: Emergency Medicine

## 2015-07-24 ENCOUNTER — Encounter (HOSPITAL_COMMUNITY): Payer: Self-pay | Admitting: *Deleted

## 2015-07-24 DIAGNOSIS — F419 Anxiety disorder, unspecified: Secondary | ICD-10-CM | POA: Insufficient documentation

## 2015-07-24 DIAGNOSIS — R079 Chest pain, unspecified: Secondary | ICD-10-CM | POA: Insufficient documentation

## 2015-07-24 DIAGNOSIS — I2510 Atherosclerotic heart disease of native coronary artery without angina pectoris: Secondary | ICD-10-CM | POA: Insufficient documentation

## 2015-07-24 DIAGNOSIS — Z8673 Personal history of transient ischemic attack (TIA), and cerebral infarction without residual deficits: Secondary | ICD-10-CM | POA: Insufficient documentation

## 2015-07-24 DIAGNOSIS — Z9049 Acquired absence of other specified parts of digestive tract: Secondary | ICD-10-CM | POA: Diagnosis not present

## 2015-07-24 DIAGNOSIS — Z90710 Acquired absence of both cervix and uterus: Secondary | ICD-10-CM | POA: Diagnosis not present

## 2015-07-24 DIAGNOSIS — R1012 Left upper quadrant pain: Secondary | ICD-10-CM | POA: Diagnosis present

## 2015-07-24 DIAGNOSIS — Z8619 Personal history of other infectious and parasitic diseases: Secondary | ICD-10-CM | POA: Diagnosis not present

## 2015-07-24 DIAGNOSIS — Z8709 Personal history of other diseases of the respiratory system: Secondary | ICD-10-CM | POA: Diagnosis not present

## 2015-07-24 DIAGNOSIS — Z9889 Other specified postprocedural states: Secondary | ICD-10-CM | POA: Insufficient documentation

## 2015-07-24 DIAGNOSIS — Z87448 Personal history of other diseases of urinary system: Secondary | ICD-10-CM | POA: Diagnosis not present

## 2015-07-24 DIAGNOSIS — E039 Hypothyroidism, unspecified: Secondary | ICD-10-CM | POA: Diagnosis not present

## 2015-07-24 DIAGNOSIS — Z8742 Personal history of other diseases of the female genital tract: Secondary | ICD-10-CM | POA: Diagnosis not present

## 2015-07-24 DIAGNOSIS — Z8719 Personal history of other diseases of the digestive system: Secondary | ICD-10-CM | POA: Insufficient documentation

## 2015-07-24 DIAGNOSIS — Z86018 Personal history of other benign neoplasm: Secondary | ICD-10-CM | POA: Diagnosis not present

## 2015-07-24 DIAGNOSIS — R42 Dizziness and giddiness: Secondary | ICD-10-CM | POA: Insufficient documentation

## 2015-07-24 DIAGNOSIS — R1032 Left lower quadrant pain: Secondary | ICD-10-CM | POA: Diagnosis not present

## 2015-07-24 DIAGNOSIS — Z7982 Long term (current) use of aspirin: Secondary | ICD-10-CM | POA: Diagnosis not present

## 2015-07-24 DIAGNOSIS — Z79899 Other long term (current) drug therapy: Secondary | ICD-10-CM | POA: Diagnosis not present

## 2015-07-24 DIAGNOSIS — J40 Bronchitis, not specified as acute or chronic: Secondary | ICD-10-CM | POA: Insufficient documentation

## 2015-07-24 DIAGNOSIS — R197 Diarrhea, unspecified: Secondary | ICD-10-CM | POA: Diagnosis not present

## 2015-07-24 DIAGNOSIS — Z862 Personal history of diseases of the blood and blood-forming organs and certain disorders involving the immune mechanism: Secondary | ICD-10-CM | POA: Insufficient documentation

## 2015-07-24 DIAGNOSIS — E785 Hyperlipidemia, unspecified: Secondary | ICD-10-CM | POA: Diagnosis not present

## 2015-07-24 DIAGNOSIS — R109 Unspecified abdominal pain: Secondary | ICD-10-CM

## 2015-07-24 LAB — CBC WITH DIFFERENTIAL/PLATELET
Basophils Absolute: 0 10*3/uL (ref 0.0–0.1)
Basophils Relative: 0 %
Eosinophils Absolute: 0.1 10*3/uL (ref 0.0–0.7)
Eosinophils Relative: 2 %
HCT: 44 % (ref 36.0–46.0)
Hemoglobin: 14.8 g/dL (ref 12.0–15.0)
Lymphocytes Relative: 27 %
Lymphs Abs: 2 10*3/uL (ref 0.7–4.0)
MCH: 30.3 pg (ref 26.0–34.0)
MCHC: 33.6 g/dL (ref 30.0–36.0)
MCV: 90.2 fL (ref 78.0–100.0)
Monocytes Absolute: 0.5 10*3/uL (ref 0.1–1.0)
Monocytes Relative: 6 %
Neutro Abs: 4.9 10*3/uL (ref 1.7–7.7)
Neutrophils Relative %: 65 %
Platelets: 127 10*3/uL — ABNORMAL LOW (ref 150–400)
RBC: 4.88 MIL/uL (ref 3.87–5.11)
RDW: 12.5 % (ref 11.5–15.5)
WBC: 7.6 10*3/uL (ref 4.0–10.5)

## 2015-07-24 LAB — COMPREHENSIVE METABOLIC PANEL
ALT: 25 U/L (ref 14–54)
AST: 24 U/L (ref 15–41)
Albumin: 4.7 g/dL (ref 3.5–5.0)
Alkaline Phosphatase: 79 U/L (ref 38–126)
Anion gap: 9 (ref 5–15)
BUN: 17 mg/dL (ref 6–20)
CO2: 26 mmol/L (ref 22–32)
Calcium: 9.8 mg/dL (ref 8.9–10.3)
Chloride: 105 mmol/L (ref 101–111)
Creatinine, Ser: 0.63 mg/dL (ref 0.44–1.00)
GFR calc Af Amer: >60 mL/min (ref >60)
GFR calc non Af Amer: >60 mL/min (ref >60)
Glucose, Bld: 99 mg/dL (ref 65–99)
Potassium: 3.9 mmol/L (ref 3.5–5.1)
Sodium: 140 mmol/L (ref 135–145)
Total Bilirubin: 0.7 mg/dL (ref 0.3–1.2)
Total Protein: 8 g/dL (ref 6.5–8.1)

## 2015-07-24 LAB — LIPASE, BLOOD: Lipase: 22 U/L (ref 11–51)

## 2015-07-24 MED ORDER — HYDROCODONE-ACETAMINOPHEN 5-325 MG PO TABS: 1.0000 | ORAL_TABLET | Freq: Four times a day (QID) | ORAL | Status: DC | PRN

## 2015-07-24 MED ORDER — SODIUM CHLORIDE 0.9 % IV BOLUS (SEPSIS)
1000.0000 mL | Freq: Once | INTRAVENOUS | Status: AC
  Administered 2015-07-24: 1000 mL via INTRAVENOUS

## 2015-07-24 MED ORDER — DIATRIZOATE MEGLUMINE & SODIUM 66-10 % PO SOLN
ORAL | Status: AC
  Administered 2015-07-24: 30 mL
  Filled 2015-07-24: qty 30

## 2015-07-24 MED ORDER — IOHEXOL 300 MG/ML  SOLN
100.0000 mL | Freq: Once | INTRAMUSCULAR | Status: AC | PRN
  Administered 2015-07-24: 100 mL via INTRAVENOUS

## 2015-07-24 MED ORDER — ONDANSETRON HCL 4 MG/2ML IJ SOLN
INTRAMUSCULAR | Status: AC
  Filled 2015-07-24: qty 2

## 2015-07-24 MED ORDER — PANTOPRAZOLE SODIUM 40 MG IV SOLR
40.0000 mg | Freq: Once | INTRAVENOUS | Status: AC
  Administered 2015-07-24: 40 mg via INTRAVENOUS
  Filled 2015-07-24: qty 40

## 2015-07-24 MED ORDER — DICYCLOMINE HCL 20 MG PO TABS: ORAL_TABLET | ORAL | Status: DC

## 2015-07-24 MED ORDER — ONDANSETRON HCL 4 MG/2ML IJ SOLN
4.0000 mg | Freq: Once | INTRAMUSCULAR | Status: AC
  Administered 2015-07-24: 4 mg via INTRAVENOUS

## 2015-07-24 NOTE — Discharge Instructions (Signed)
Follow up with your md next week for recheck °

## 2015-07-24 NOTE — ED Notes (Signed)
Lab at bedside

## 2015-07-24 NOTE — ED Notes (Signed)
MD at bedside. 

## 2015-07-24 NOTE — ED Notes (Signed)
Pt was at work and began having central chest pain rated at 2. Pt has recently been dx with an URI.  And states she has been coughing up green sputum. Pt admits to mild shortness of breath, denies any radiating pain.

## 2015-07-24 NOTE — ED Notes (Signed)
Pt actively vomiting contrast.

## 2015-07-24 NOTE — ED Provider Notes (Signed)
CSN: [id number]     Arrival date & time 07/24/15  1104 History  By signing my name below, I, Terressa Koyanagi, attest that this documentation has been prepared under the direction and in the presence of Milton Ferguson, MD. Electronically Signed: Terressa Koyanagi, ED Scribe. 07/24/2015. 11:32 AM.  Chief Complaint  Patient presents with  . Chest Pain   Patient is a 61 y.o. female presenting with abdominal pain. The history is provided by the patient (Patient states she's had a cough for over 2 weeks. Recently put on Z-Pak and prednisone. She now complains of diarrhea and left upper lower quadrant abdominal pain). No language interpreter was used.  Abdominal Pain Pain location:  LUQ and LLQ Pain quality: aching   Pain radiates to:  Does not radiate Pain severity:  Moderate Onset quality:  Gradual Timing:  Constant Progression:  Waxing and waning Chronicity:  New Relieved by:  Nothing Worsened by:  Nothing tried Associated symptoms: diarrhea   Associated symptoms: no hematuria    PCP: Purvis Kilts, MD HPI Comments: Kristina Mcdonald is a 60 y.o. female, with PMHx noted below including HTN, HLD, CAD, stroke, who presents to the Emergency Department complaining of left side abd pain. Associated . Pt denies Hx of cholecystectomy or appendectomy.   Pt notes she has been battling an URI involving a productive cough and diarrhea for the past 4 weeks. Pt was treated for the URI with penicillin, steroid shot, and Z-Pak three weeks ago.   Past Medical History  Diagnosis Date  . Overactive bladder   . Hypertension   . Hypothyroidism 10/2006    Status post thyroidectomy for thyroid mass.  . Hyperlipidemia   . Thrombocytopenia (HCC)     Chronic  . CAD (coronary artery disease) 02/2001    Nonobstructive per cath.  . Internal hemorrhoids 09/2003  . Chronic anxiety   . Seasonal allergies   . Stroke ([place])   . Insomnia 05/10/2013  . Stress 05/10/2013  . Vaginal discharge 11/22/2013  . BV (bacterial  vaginosis) 11/22/2013  . Thrush, oral 11/22/2013  . Tubular adenoma   . Vaginal odor 10/09/2014  . LLQ pain 10/09/2014   Past Surgical History  Procedure Laterality Date  . Total abdominal hysterectomy w/ bilateral salpingoophorectomy  09/2001.  Marland Kitchen Thyroidectomy  10/2006    Neg for cancer per patient  . Laparotomy  11/2002    Excision of ovarian remnant  . Appendectomy  11/2002.    Incidental during laparotomy and excision of ovarian remnant.  . Cardiac catheterization  02/2001    Nonobstructive CAD  . Colonoscopy  09/2003    Per Dr. Gala Romney. Internal hemorrhoids.normal otherwise  . Inner ear surgery      ear drum  . Abdominal hysterectomy    . Colonoscopy N/A 12/04/2013    Dr. Gala Romney- normal rectum aside from minimal internal hemorrhoids. the patient had a 44mm polyp in the mid ascending segment and an 1mm poylp in the mid descending segment o/w the remaider of the colonic mucosa appeared normal. the distal 10cm of terminal mucosa also appeared normal.tubular adenoma on bx  . Esophagogastroduodenoscopy N/A 12/04/2013    Dr.Rourk- distal esophageal erosions w/i 8mm of the GE junction. no basrrett's esophagus. tubular esophagus patent throughout its course. focal antral erosions o/w normal appearing gastric mucosa. patent pylorus, normal first and second portion of the duodenum. mild chronic inflammation on bx   Family History  Problem Relation Age of Onset  . Heart disease Mother  CHF  . Cancer Father     prostate  . Stroke Brother   . Stroke Sister   . Heart disease Sister     CHF  . Multiple sclerosis Sister   . Stroke Daughter     light strokes  . Colon cancer Neg Hx   . Heart attack Sister    Social History  Substance Use Topics  . Smoking status: Never Smoker   . Smokeless tobacco: Never Used     Comment: Never smoker  . Alcohol Use: No   OB History    Gravida Para Term Preterm AB TAB SAB Ectopic Multiple Living   2 2        2      Review of Systems  Constitutional:  Negative for appetite change.  HENT: Negative for congestion, ear discharge and sinus pressure.   Eyes: Negative for discharge.  Cardiovascular: Negative for leg swelling.  Gastrointestinal: Positive for abdominal pain and diarrhea.  Endocrine: Negative for polydipsia.  Genitourinary: Negative for frequency and hematuria.  Skin: Negative for rash.  Neurological: Positive for light-headedness. Negative for seizures.  Psychiatric/Behavioral: Negative for hallucinations.   Allergies  Review of patient's allergies indicates no known allergies.  Home Medications   Prior to Admission medications   Medication Sig Start Date End Date Taking? Authorizing Provider  ALPRAZolam (XANAX) 1 MG tablet TAKE 1 TABLET BY MOUTH 3 TIMES A DAY AS NEEDED   Yes Estill Dooms, NP  aspirin EC 81 MG tablet Take 81 mg by mouth daily.   Yes Historical Provider, MD  cetirizine (ZYRTEC) 10 MG tablet Take 10 mg by mouth at bedtime.    Yes Historical Provider, MD  esomeprazole (NEXIUM) 40 MG capsule Take 1 capsule (40 mg total) by mouth daily before breakfast. 12/12/13  Yes Orvil Feil, NP  levothyroxine (SYNTHROID, LEVOTHROID) 50 MCG tablet Take 50 mcg by mouth daily before breakfast.   Yes Historical Provider, MD  losartan-hydrochlorothiazide (HYZAAR) 50-12.5 MG per tablet Take 1 tablet by mouth every morning.    Yes Historical Provider, MD  Multiple Vitamin (MULITIVITAMIN WITH MINERALS) TABS Take 1 tablet by mouth every morning.    Yes Historical Provider, MD  oxybutynin (DITROPAN-XL) 10 MG 24 hr tablet Take 10 mg by mouth every morning.   Yes Historical Provider, MD  pantoprazole (PROTONIX) 40 MG tablet Take 1 tablet (40 mg total) by mouth daily. 12/13/13  Yes Orvil Feil, NP  vitamin B-12 (CYANOCOBALAMIN) 1000 MCG tablet Take 1,000 mcg by mouth every morning.    Yes Historical Provider, MD  vitamin E 400 UNIT capsule Take 800 Units by mouth daily.    Yes Historical Provider, MD  metroNIDAZOLE (METROGEL VAGINAL)  0.75 % vaginal gel Place 1 Applicatorful vaginally at bedtime. Patient not taking: Reported on 07/24/2015 10/09/14   Estill Dooms, NP  nystatin (MYCOSTATIN) 100000 UNIT/ML suspension Take 5 mLs (500,000 Units total) by mouth 4 (four) times daily. Patient not taking: Reported on 07/24/2015 10/09/14   Estill Dooms, NP  nystatin-triamcinolone ointment [employer]) Apply 1 application topically 2 (two) times daily. Patient not taking: Reported on 07/24/2015 10/09/14   Estill Dooms, NP  pravastatin (PRAVACHOL) 40 MG tablet Take 1 tablet by mouth daily. 11/25/13   Historical Provider, MD   Triage Vitals: Pulse 104  Temp(Src) 98.2 F (36.8 C) (Oral)  Resp 18  Wt 183 lb (83.008 kg)  SpO2 95% Physical Exam  Constitutional: She is oriented to person, place, and time. She appears  well-developed.  HENT:  Head: Normocephalic.  Eyes: Conjunctivae and EOM are normal. No scleral icterus.  Neck: Neck supple. No thyromegaly present.  Cardiovascular: Normal rate and regular rhythm.  Exam reveals no gallop and no friction rub.   No murmur heard. Pulmonary/Chest: No stridor. She has no wheezes. She has no rales. She exhibits no tenderness.  Abdominal: She exhibits no distension. There is tenderness in the left upper quadrant. There is no rebound.  Musculoskeletal: Normal range of motion. She exhibits no edema.  Lymphadenopathy:    She has no cervical adenopathy.  Neurological: She is oriented to person, place, and time. She exhibits normal muscle tone. Coordination normal.  Skin: No rash noted. No erythema.  Psychiatric: She has a normal mood and affect. Her behavior is normal.    ED Course  Procedures (including critical care time) DIAGNOSTIC STUDIES: Oxygen Saturation is 95% on ra, nl by my interpretation.    COORDINATION OF CARE: 11:20 AM: Discussed treatment plan which includes imaging and labs with pt at bedside; patient verbalizes understanding and agrees with treatment plan.  Labs  Review Labs Reviewed  CBC WITH DIFFERENTIAL/PLATELET  COMPREHENSIVE METABOLIC PANEL  LIPASE, BLOOD    Imaging Review No results found. I have personally reviewed and evaluated these images and lab results as part of my medical decision-making.   EKG Interpretation   Date/Time:  Wednesday July 24 2015 11:09:14 EST Ventricular Rate:  106 PR Interval:  145 QRS Duration: 83 QT Interval:  325 QTC Calculation: 431 R Axis:   61 Text Interpretation:  Sinus tachycardia Abnormal R-wave progression, early  transition Borderline repolarization abnormality Confirmed by Mong Neal  MD,  Alucard Fearnow [phone]) on 07/24/2015 2:49:12 PM      MDM   Final diagnoses:  None   Labs unremarkable. Chest x-ray shows bronchitis CT scan of abdomen negative suspect diarrhea and abdominal discomfort with cough and congestion all related to the same infection.  Patient will continue Z-Pak and prednisone. She will be put on Bentyl and Vicodin and follow-up with her doctor   Milton Ferguson, MD 07/24/15 1505

## 2015-07-28 ENCOUNTER — Other Ambulatory Visit: Payer: Self-pay | Admitting: Adult Health

## 2015-09-05 DIAGNOSIS — R159 Full incontinence of feces: Secondary | ICD-10-CM | POA: Insufficient documentation

## 2016-06-18 ENCOUNTER — Emergency Department (HOSPITAL_COMMUNITY)
Admission: EM | Admit: 2016-06-18 | Discharge: 2016-06-19 | Disposition: A | Discharge status: Home / Self Care | Payer: BLUE CROSS/BLUE SHIELD | Source: Home / Self Care | Attending: Emergency Medicine | Admitting: Emergency Medicine

## 2016-06-18 ENCOUNTER — Encounter (HOSPITAL_COMMUNITY): Payer: Self-pay | Admitting: Emergency Medicine

## 2016-06-18 ENCOUNTER — Encounter (HOSPITAL_COMMUNITY): Payer: Self-pay | Admitting: *Deleted

## 2016-06-18 ENCOUNTER — Emergency Department (HOSPITAL_COMMUNITY)
Admission: EM | Admit: 2016-06-18 | Discharge: 2016-06-18 | Disposition: A | Discharge status: Home / Self Care | Payer: BLUE CROSS/BLUE SHIELD | Attending: Emergency Medicine | Admitting: Emergency Medicine

## 2016-06-18 DIAGNOSIS — R42 Dizziness and giddiness: Secondary | ICD-10-CM

## 2016-06-18 DIAGNOSIS — Z7982 Long term (current) use of aspirin: Secondary | ICD-10-CM | POA: Insufficient documentation

## 2016-06-18 DIAGNOSIS — I10 Essential (primary) hypertension: Secondary | ICD-10-CM

## 2016-06-18 DIAGNOSIS — R040 Epistaxis: Secondary | ICD-10-CM

## 2016-06-18 DIAGNOSIS — E039 Hypothyroidism, unspecified: Secondary | ICD-10-CM | POA: Insufficient documentation

## 2016-06-18 DIAGNOSIS — Z79899 Other long term (current) drug therapy: Secondary | ICD-10-CM

## 2016-06-18 DIAGNOSIS — I2510 Atherosclerotic heart disease of native coronary artery without angina pectoris: Secondary | ICD-10-CM | POA: Diagnosis not present

## 2016-06-18 LAB — CBC WITH DIFFERENTIAL/PLATELET
Basophils Absolute: 0 10*3/uL (ref 0.0–0.1)
Basophils Absolute: 0 10*3/uL (ref 0.0–0.1)
Basophils Relative: 0 %
Basophils Relative: 0 %
Eosinophils Absolute: 0.1 10*3/uL (ref 0.0–0.7)
Eosinophils Absolute: 0.1 10*3/uL (ref 0.0–0.7)
Eosinophils Relative: 2 %
Eosinophils Relative: 2 %
HCT: 40.1 % (ref 36.0–46.0)
HCT: 42.2 % (ref 36.0–46.0)
Hemoglobin: 13.4 g/dL (ref 12.0–15.0)
Hemoglobin: 14.2 g/dL (ref 12.0–15.0)
Lymphocytes Relative: 24 %
Lymphocytes Relative: 27 %
Lymphs Abs: 1.5 10*3/uL (ref 0.7–4.0)
Lymphs Abs: 1.9 10*3/uL (ref 0.7–4.0)
MCH: 30.4 pg (ref 26.0–34.0)
MCH: 30.5 pg (ref 26.0–34.0)
MCHC: 33.4 g/dL (ref 30.0–36.0)
MCHC: 33.6 g/dL (ref 30.0–36.0)
MCV: 90.8 fL (ref 78.0–100.0)
MCV: 90.9 fL (ref 78.0–100.0)
Monocytes Absolute: 0.5 10*3/uL (ref 0.1–1.0)
Monocytes Absolute: 0.6 10*3/uL (ref 0.1–1.0)
Monocytes Relative: 8 %
Monocytes Relative: 9 %
Neutro Abs: 4.2 10*3/uL (ref 1.7–7.7)
Neutro Abs: 4.3 10*3/uL (ref 1.7–7.7)
Neutrophils Relative %: 62 %
Neutrophils Relative %: 67 %
Platelets: 152 10*3/uL (ref 150–400)
Platelets: DECREASED 10*3/uL (ref 150–400)
RBC: 4.41 MIL/uL (ref 3.87–5.11)
RBC: 4.65 MIL/uL (ref 3.87–5.11)
RDW: 12.4 % (ref 11.5–15.5)
RDW: 12.4 % (ref 11.5–15.5)
WBC: 6.3 10*3/uL (ref 4.0–10.5)
WBC: 6.9 10*3/uL (ref 4.0–10.5)

## 2016-06-18 LAB — BASIC METABOLIC PANEL
Anion gap: 6 (ref 5–15)
BUN: 18 mg/dL (ref 6–20)
CO2: 27 mmol/L (ref 22–32)
Calcium: 9.4 mg/dL (ref 8.9–10.3)
Chloride: 107 mmol/L (ref 101–111)
Creatinine, Ser: 0.61 mg/dL (ref 0.44–1.00)
GFR calc Af Amer: >60 mL/min (ref >60)
GFR calc non Af Amer: >60 mL/min (ref >60)
Glucose, Bld: 111 mg/dL — ABNORMAL HIGH (ref 65–99)
Potassium: 3.6 mmol/L (ref 3.5–5.1)
Sodium: 140 mmol/L (ref 135–145)

## 2016-06-18 LAB — PROTIME-INR
INR: 1
Prothrombin Time: 13.2 seconds (ref 11.4–15.2)

## 2016-06-18 MED ORDER — OXYMETAZOLINE HCL 0.05 % NA SOLN
NASAL | Status: DC
Start: 2016-06-18 — End: 2016-06-19
  Filled 2016-06-18: qty 15

## 2016-06-18 MED ORDER — SODIUM CHLORIDE 0.9 % IV BOLUS (SEPSIS)
1000.0000 mL | Freq: Once | INTRAVENOUS | Status: AC
  Administered 2016-06-18: 1000 mL via INTRAVENOUS

## 2016-06-18 MED ORDER — OXYMETAZOLINE HCL 0.05 % NA SOLN
NASAL | Status: AC
  Filled 2016-06-18: qty 15

## 2016-06-18 MED ORDER — TRANEXAMIC ACID 1000 MG/10ML IV SOLN
500.0000 mg | Freq: Once | INTRAVENOUS | Status: AC
  Administered 2016-06-18: 500 mg via TOPICAL
  Filled 2016-06-18: qty 10

## 2016-06-18 NOTE — ED Provider Notes (Signed)
[facility] DEPT Provider Note   CSN: [id number] Arrival date & time: 06/18/16  [id number]     History   Chief Complaint Chief Complaint  Patient presents with  . Epistaxis    HPI Kristina Mcdonald is a 60 y.o. female.  Patient complains of a nosebleed from right nostril. This is like the fourth fifth time in the last week she's had bleeding. She does take an aspirin a day   The history is provided by the patient. No language interpreter was used.  Epistaxis   This is a new problem. The current episode started 1 to 2 hours ago. The problem occurs constantly. The problem has not changed since onset.The problem is associated with anticoagulants. The bleeding has been from the right nare. She has tried nothing for the symptoms. Her past medical history does not include sinus problems.    Past Medical History:  Diagnosis Date  . BV (bacterial vaginosis) 11/22/2013  . CAD (coronary artery disease) 02/2001   Nonobstructive per cath.  . Chronic anxiety   . Hyperlipidemia   . Hypertension   . Hypothyroidism 10/2006   Status post thyroidectomy for thyroid mass.  . Insomnia 05/10/2013  . Internal hemorrhoids 09/2003  . LLQ pain 10/09/2014  . Overactive bladder   . Seasonal allergies   . Stress 05/10/2013  . Stroke ([place])   . Thrombocytopenia (HCC)    Chronic  . Thrush, oral 11/22/2013  . Tubular adenoma   . Vaginal discharge 11/22/2013  . Vaginal odor 10/09/2014    Patient Active Problem List   Diagnosis Date Noted  . Vaginal odor 10/09/2014  . LLQ pain 10/09/2014  . Diarrhea 12/14/2013  . Loose stools 11/29/2013  . Nausea with vomiting 11/29/2013  . Vaginal discharge 11/22/2013  . BV (bacterial vaginosis) 11/22/2013  . Thrush, oral 11/22/2013  . Insomnia 05/10/2013  . Stress 05/10/2013  . Left sided numbness 10/28/2011  . Left-sided weakness 10/28/2011  . Thrombocytopenia ([place]) 10/28/2011  . Acute thrombotic stroke 10/28/2011  . Hypertension 10/28/2011  . Hyperlipidemia  10/28/2011    Past Surgical History:  Procedure Laterality Date  . ABDOMINAL HYSTERECTOMY    . APPENDECTOMY  11/2002.   Incidental during laparotomy and excision of ovarian remnant.  Marland Kitchen CARDIAC CATHETERIZATION  02/2001   Nonobstructive CAD  . COLONOSCOPY  09/2003   Per Dr. Gala Romney. Internal hemorrhoids.normal otherwise  . COLONOSCOPY N/A 12/04/2013   Dr. Gala Romney- normal rectum aside from minimal internal hemorrhoids. the patient had a 80mm polyp in the mid ascending segment and an 11mm poylp in the mid descending segment o/w the remaider of the colonic mucosa appeared normal. the distal 10cm of terminal mucosa also appeared normal.tubular adenoma on bx  . ESOPHAGOGASTRODUODENOSCOPY N/A 12/04/2013   Dr.Rourk- distal esophageal erosions w/i 32mm of the GE junction. no basrrett's esophagus. tubular esophagus patent throughout its course. focal antral erosions o/w normal appearing gastric mucosa. patent pylorus, normal first and second portion of the duodenum. mild chronic inflammation on bx  . INNER EAR SURGERY     ear drum  . LAPAROTOMY  11/2002   Excision of ovarian remnant  . THYROIDECTOMY  10/2006   Neg for cancer per patient  . TOTAL ABDOMINAL HYSTERECTOMY W/ BILATERAL SALPINGOOPHORECTOMY  09/2001.    OB History    Gravida Para Term Preterm AB Living   2 2       2    SAB TAB Ectopic Multiple Live Births           2  Home Medications    Prior to Admission medications   Medication Sig Start Date End Date Taking? Authorizing Provider  ALPRAZolam (XANAX) 1 MG tablet TAKE 1 TABLET BY MOUTH 3 TIMES A DAY AS NEEDED    Estill Dooms, NP  aspirin EC 81 MG tablet Take 81 mg by mouth daily.    Historical Provider, MD  atorvastatin (LIPITOR) 40 MG tablet Take 40 mg by mouth daily.    Historical Provider, MD  azithromycin (ZITHROMAX) 250 MG tablet Take 250 mg by mouth daily.    Historical Provider, MD  cetirizine (ZYRTEC) 10 MG tablet Take 10 mg by mouth at bedtime.     Historical Provider, MD    dicyclomine (BENTYL) 20 MG tablet Take one every 8 hours for abd cramps 07/24/15   Milton Ferguson, MD  esomeprazole (NEXIUM) 40 MG capsule Take 1 capsule (40 mg total) by mouth daily before breakfast. 12/12/13   Annitta Needs, NP  HYDROcodone-acetaminophen (NORCO/VICODIN) 5-325 MG tablet Take 1 tablet by mouth every 6 (six) hours as needed. 07/24/15   Milton Ferguson, MD  levothyroxine (SYNTHROID, LEVOTHROID) 50 MCG tablet Take 50 mcg by mouth daily before breakfast.    Historical Provider, MD  losartan-hydrochlorothiazide (HYZAAR) 50-12.5 MG per tablet Take 1 tablet by mouth every morning.     Historical Provider, MD  metroNIDAZOLE (METROGEL VAGINAL) 0.75 % vaginal gel Place 1 Applicatorful vaginally at bedtime. Patient not taking: Reported on 07/24/2015 10/09/14   Estill Dooms, NP  Multiple Vitamin (MULITIVITAMIN WITH MINERALS) TABS Take 1 tablet by mouth every morning.     Historical Provider, MD  nystatin (MYCOSTATIN) 100000 UNIT/ML suspension Take 5 mLs (500,000 Units total) by mouth 4 (four) times daily. Patient not taking: Reported on 07/24/2015 10/09/14   Estill Dooms, NP  nystatin-triamcinolone ointment (MYCOLOG) APPLY TO AFFECTED AREA TWICE A DAY 07/29/15   Estill Dooms, NP  oxybutynin (DITROPAN-XL) 10 MG 24 hr tablet Take 10 mg by mouth every morning.    Historical Provider, MD  pantoprazole (PROTONIX) 40 MG tablet Take 1 tablet (40 mg total) by mouth daily. 12/13/13   Annitta Needs, NP  vitamin B-12 (CYANOCOBALAMIN) 1000 MCG tablet Take 1,000 mcg by mouth every morning.     Historical Provider, MD  vitamin E 400 UNIT capsule Take 800 Units by mouth daily.     Historical Provider, MD    Family History Family History  Problem Relation Age of Onset  . Heart disease Mother     CHF  . Cancer Father     prostate  . Stroke Brother   . Stroke Sister   . Heart disease Sister     CHF  . Multiple sclerosis Sister   . Stroke Daughter     light strokes  . Heart attack Sister   .  Colon cancer Neg Hx     Social History Social History  Substance Use Topics  . Smoking status: Never Smoker  . Smokeless tobacco: Never Used     Comment: Never smoker  . Alcohol use No     Allergies   Patient has no known allergies.   Review of Systems Review of Systems  Constitutional: Negative for appetite change and fatigue.  HENT: Positive for nosebleeds. Negative for congestion, ear discharge and sinus pressure.   Eyes: Negative for discharge.  Respiratory: Negative for cough.   Cardiovascular: Negative for chest pain.  Gastrointestinal: Negative for abdominal pain and diarrhea.  Genitourinary: Negative for frequency and hematuria.  Musculoskeletal: Negative for back pain.  Skin: Negative for rash.  Neurological: Negative for seizures and headaches.  Psychiatric/Behavioral: Negative for hallucinations.     Physical Exam Updated Vital Signs BP 133/81   Pulse 79   Temp 97.6 F (36.4 C) (Oral)   Resp 20   Ht 5' (1.524 m)   Wt 200 lb (90.7 kg)   SpO2 96%   BMI 39.06 kg/m   Physical Exam  Constitutional: She is oriented to person, place, and time. She appears well-developed.  HENT:  Head: Normocephalic.  Patient having pleating of the right nostril.  Eyes: Conjunctivae are normal.  Neck: No tracheal deviation present.  Cardiovascular:  No murmur heard. Musculoskeletal: Normal range of motion.  Neurological: She is oriented to person, place, and time.  Skin: Skin is warm.  Psychiatric: She has a normal mood and affect.     ED Treatments / Results  Labs (all labs ordered are listed, but only abnormal results are displayed) Labs Reviewed  BASIC METABOLIC PANEL - Abnormal; Notable for the following:       Result Value   Glucose, Bld 111 (*)    All other components within normal limits  CBC WITH DIFFERENTIAL/PLATELET    EKG  EKG Interpretation None       Radiology No results found.  Procedures Procedures (including critical care  time)  Medications Ordered in ED Medications  oxymetazoline (AFRIN) 0.05 % nasal spray (not administered)  sodium chloride 0.9 % bolus 1,000 mL (1,000 mLs Intravenous New Bag/Given 06/18/16 0948)     Initial Impression / Assessment and Plan / ED Course  I have reviewed the triage vital signs and the nursing notes.  Pertinent labs & imaging results that were available during my care of the patient were reviewed by me and considered in my medical decision making (see chart for details).  Clinical Course     Patient was given Afrin and held her nose closed. The bleeding stopped. She was instructed to stop aspirin for for 5 days and follow-up with her doctor  Final Clinical Impressions(s) / ED Diagnoses   Final diagnoses:  Epistaxis    New Prescriptions New Prescriptions   No medications on file     Milton Ferguson, MD 06/18/16 1103

## 2016-06-18 NOTE — Discharge Instructions (Signed)
Stop taking your aspirin until Tuesday. Follow-up with your doctor for recheck in a week. Return to the emergency department if your nose starts bleeding again and it will not stop with the Afrin spray

## 2016-06-18 NOTE — ED Provider Notes (Signed)
[facility] DEPT Provider Note   CSN: [id number] Arrival date & time: 06/18/16  1950  By signing my name below, I, Dolores Hoose, attest that this documentation has been prepared under the direction and in the presence of Ezequiel Essex, MD. Electronically Signed: Dolores Hoose, Scribe. 06/18/2016. 11:30 PM.  History   Chief Complaint Chief Complaint  Patient presents with  . Epistaxis   The history is provided by the patient. No language interpreter was used.    HPI Comments:   Kristina Mcdonald is a 60 y.o. female with pmhx of HTN, HLD, CAD, and Stroke who presents to the Emergency Department complaining of sudden-onset intermittent recurrent right-sided epistaxis beginning about 16 hours ago and recurring about 5 hours ago. She has been pinching her nose all day with minimal relief. Pt states she was seen earlier for her first nosebleed where she was given Afrin which stopped her bleeding temporarily, but her symptoms returned just after discharge. Pt reports associated right ear discomfort and dizziness. She denies any recent injury or wound. Pt is compliant with a daily baby aspirin.   Past Medical History:  Diagnosis Date  . BV (bacterial vaginosis) 11/22/2013  . CAD (coronary artery disease) 02/2001   Nonobstructive per cath.  . Chronic anxiety   . Hyperlipidemia   . Hypertension   . Hypothyroidism 10/2006   Status post thyroidectomy for thyroid mass.  . Insomnia 05/10/2013  . Internal hemorrhoids 09/2003  . LLQ pain 10/09/2014  . Overactive bladder   . Seasonal allergies   . Stress 05/10/2013  . Stroke ([place])   . Thrombocytopenia (HCC)    Chronic  . Thrush, oral 11/22/2013  . Tubular adenoma   . Vaginal discharge 11/22/2013  . Vaginal odor 10/09/2014    Patient Active Problem List   Diagnosis Date Noted  . Vaginal odor 10/09/2014  . LLQ pain 10/09/2014  . Diarrhea 12/14/2013  . Loose stools 11/29/2013  . Nausea with vomiting 11/29/2013  . Vaginal discharge  11/22/2013  . BV (bacterial vaginosis) 11/22/2013  . Thrush, oral 11/22/2013  . Insomnia 05/10/2013  . Stress 05/10/2013  . Left sided numbness 10/28/2011  . Left-sided weakness 10/28/2011  . Thrombocytopenia ([place]) 10/28/2011  . Acute thrombotic stroke 10/28/2011  . Hypertension 10/28/2011  . Hyperlipidemia 10/28/2011    Past Surgical History:  Procedure Laterality Date  . ABDOMINAL HYSTERECTOMY    . APPENDECTOMY  11/2002.   Incidental during laparotomy and excision of ovarian remnant.  Marland Kitchen CARDIAC CATHETERIZATION  02/2001   Nonobstructive CAD  . COLONOSCOPY  09/2003   Per Dr. Gala Romney. Internal hemorrhoids.normal otherwise  . COLONOSCOPY N/A 12/04/2013   Dr. Gala Romney- normal rectum aside from minimal internal hemorrhoids. the patient had a 87mm polyp in the mid ascending segment and an 69mm poylp in the mid descending segment o/w the remaider of the colonic mucosa appeared normal. the distal 10cm of terminal mucosa also appeared normal.tubular adenoma on bx  . ESOPHAGOGASTRODUODENOSCOPY N/A 12/04/2013   Dr.Rourk- distal esophageal erosions w/i 61mm of the GE junction. no basrrett's esophagus. tubular esophagus patent throughout its course. focal antral erosions o/w normal appearing gastric mucosa. patent pylorus, normal first and second portion of the duodenum. mild chronic inflammation on bx  . INNER EAR SURGERY     ear drum  . LAPAROTOMY  11/2002   Excision of ovarian remnant  . THYROIDECTOMY  10/2006   Neg for cancer per patient  . TOTAL ABDOMINAL HYSTERECTOMY W/ BILATERAL SALPINGOOPHORECTOMY  09/2001.    OB History  Gravida Para Term Preterm AB Living   2 2       2    SAB TAB Ectopic Multiple Live Births           2       Home Medications    Prior to Admission medications   Medication Sig Start Date End Date Taking? Authorizing Provider  ALPRAZolam (XANAX) 1 MG tablet TAKE 1 TABLET BY MOUTH 3 TIMES A DAY AS NEEDED    Estill Dooms, NP  aspirin EC 81 MG tablet Take 81 mg by  mouth daily.    Historical Provider, MD  atorvastatin (LIPITOR) 40 MG tablet Take 40 mg by mouth daily.    Historical Provider, MD  azithromycin (ZITHROMAX) 250 MG tablet Take 250 mg by mouth daily.    Historical Provider, MD  cetirizine (ZYRTEC) 10 MG tablet Take 10 mg by mouth at bedtime.     Historical Provider, MD  dicyclomine (BENTYL) 20 MG tablet Take one every 8 hours for abd cramps 07/24/15   Milton Ferguson, MD  esomeprazole (NEXIUM) 40 MG capsule Take 1 capsule (40 mg total) by mouth daily before breakfast. 12/12/13   Annitta Needs, NP  HYDROcodone-acetaminophen (NORCO/VICODIN) 5-325 MG tablet Take 1 tablet by mouth every 6 (six) hours as needed. 07/24/15   Milton Ferguson, MD  levothyroxine (SYNTHROID, LEVOTHROID) 50 MCG tablet Take 50 mcg by mouth daily before breakfast.    Historical Provider, MD  losartan-hydrochlorothiazide (HYZAAR) 50-12.5 MG per tablet Take 1 tablet by mouth every morning.     Historical Provider, MD  metroNIDAZOLE (METROGEL VAGINAL) 0.75 % vaginal gel Place 1 Applicatorful vaginally at bedtime. Patient not taking: Reported on 07/24/2015 10/09/14   Estill Dooms, NP  Multiple Vitamin (MULITIVITAMIN WITH MINERALS) TABS Take 1 tablet by mouth every morning.     Historical Provider, MD  nystatin (MYCOSTATIN) 100000 UNIT/ML suspension Take 5 mLs (500,000 Units total) by mouth 4 (four) times daily. Patient not taking: Reported on 07/24/2015 10/09/14   Estill Dooms, NP  nystatin-triamcinolone ointment (MYCOLOG) APPLY TO AFFECTED AREA TWICE A DAY 07/29/15   Estill Dooms, NP  oxybutynin (DITROPAN-XL) 10 MG 24 hr tablet Take 10 mg by mouth every morning.    Historical Provider, MD  pantoprazole (PROTONIX) 40 MG tablet Take 1 tablet (40 mg total) by mouth daily. 12/13/13   Annitta Needs, NP  vitamin B-12 (CYANOCOBALAMIN) 1000 MCG tablet Take 1,000 mcg by mouth every morning.     Historical Provider, MD  vitamin E 400 UNIT capsule Take 800 Units by mouth daily.      Historical Provider, MD    Family History Family History  Problem Relation Age of Onset  . Heart disease Mother     CHF  . Cancer Father     prostate  . Stroke Brother   . Stroke Sister   . Heart disease Sister     CHF  . Multiple sclerosis Sister   . Stroke Daughter     light strokes  . Heart attack Sister   . Colon cancer Neg Hx     Social History Social History  Substance Use Topics  . Smoking status: Never Smoker  . Smokeless tobacco: Never Used     Comment: Never smoker  . Alcohol use No     Allergies   Patient has no known allergies.   Review of Systems Review of Systems  HENT: Positive for ear pain and nosebleeds.   Skin: Negative for  wound.  Neurological: Positive for dizziness.  All other systems reviewed and are negative.    Physical Exam Updated Vital Signs BP 152/67 (BP Location: Right Arm)   Pulse 87   Temp 98.8 F (37.1 C) (Temporal)   Resp 20   Ht 5' (1.524 m)   Wt 200 lb (90.7 kg)   SpO2 95%   BMI 39.06 kg/m   Physical Exam  Constitutional: She is oriented to person, place, and time. She appears well-developed and well-nourished. No distress.  HENT:  Head: Normocephalic and atraumatic.  Mouth/Throat: Oropharynx is clear and moist. No oropharyngeal exudate.  Steady oozing from right nare. Appears to be coming from septum.   Eyes: Conjunctivae and EOM are normal. Pupils are equal, round, and reactive to light.  Neck: Normal range of motion. Neck supple.  No meningismus.  Cardiovascular: Normal rate, regular rhythm, normal heart sounds and intact distal pulses.   No murmur heard. Pulmonary/Chest: Effort normal and breath sounds normal. No respiratory distress.  Abdominal: Soft. There is no tenderness. There is no rebound and no guarding.  Musculoskeletal: Normal range of motion. She exhibits no edema or tenderness.  Neurological: She is alert and oriented to person, place, and time. No cranial nerve deficit. She exhibits normal  muscle tone. Coordination normal.   5/5 strength throughout. CN 2-12 intact.Equal grip strength.   Skin: Skin is warm.  Psychiatric: She has a normal mood and affect. Her behavior is normal.  Nursing note and vitals reviewed.    ED Treatments / Results  DIAGNOSTIC STUDIES:  Oxygen Saturation is 96% on RA, normal by my interpretation.    COORDINATION OF CARE:  11:34 PM Discussed treatment plan with pt at bedside which includes administration of a rhino-rocket and Afrin and pt agreed to plan.  12:45 AM Rechecked patient. Blood has subsided, no blood in posterior pharynx.  Labs (all labs ordered are listed, but only abnormal results are displayed) Labs Reviewed - No data to display  Procedures .Epistaxis Management Date/Time: 06/19/2016 12:08 AM Performed by: Ezequiel Essex Authorized by: Ezequiel Essex   Consent:    Consent obtained:  Verbal   Consent given by:  Patient   Risks discussed:  Bleeding   Alternatives discussed:  No treatment Procedure details:    Treatment site:  R anterior   Treatment method:  Anterior pack and nasal tampon (5.5 Rhino rocket coated in TXA. )   Treatment complexity:  Limited   Treatment episode: recurring   Post-procedure details:    Assessment:  Bleeding stopped   Patient tolerance of procedure:  Tolerated well, no immediate complications   (including critical care time)  Medications Ordered in ED Medications - No data to display   Initial Impression / Assessment and Plan / ED Course  I have reviewed the triage vital signs and the nursing notes.  Pertinent labs & imaging results that were available during my care of the patient were reviewed by me and considered in my medical decision making (see chart for details).  Clinical Course    Patient presents with recurrent nosebleeds to right naris. Seen earlier today for same. Denies any trauma. Denies any blood thinner use.  Hemoglobin is stable. Bleeding controlled anterior  packing and TXA.  No further bleeding in the ED.  OP clear.  Packing will be left in place. Prophylactic antibiotics given. Follow up with ENT. Return precautions discussed.  Final Clinical Impressions(s) / ED Diagnoses   Final diagnoses:  Right-sided epistaxis    New Prescriptions  New Prescriptions   No medications on file  I personally performed the services described in this documentation, which was scribed in my presence. The recorded information has been reviewed and is accurate.     Ezequiel Essex, MD 06/19/16 [phone]

## 2016-06-18 NOTE — ED Notes (Signed)
ED Provider at bedside. 

## 2016-06-18 NOTE — ED Triage Notes (Signed)
Pt comes back this evening with a nosebleed that started back at 1830. Pt seen here earlier today for same.

## 2016-06-18 NOTE — ED Triage Notes (Signed)
Pt comes in with nose bleed that has been intermittent for the last week. Pt states this episode started around 1 hour ago. Bleeding is coming from her right nare. Pt is applying pressure at this time. Denies any blood thinner use.

## 2016-06-18 NOTE — ED Notes (Signed)
No bleeding noted at this time

## 2016-06-19 MED ORDER — HYDROCODONE-ACETAMINOPHEN 5-325 MG PO TABS
1.0000 | ORAL_TABLET | Freq: Once | ORAL | Status: AC
  Administered 2016-06-19: 1 via ORAL
  Filled 2016-06-19: qty 1

## 2016-06-19 MED ORDER — CEPHALEXIN 500 MG PO CAPS: 500.0000 mg | ORAL_CAPSULE | Freq: Three times a day (TID) | ORAL | 0 refills | Status: DC

## 2016-06-19 MED ORDER — CEPHALEXIN 500 MG PO CAPS
500.0000 mg | ORAL_CAPSULE | Freq: Once | ORAL | Status: AC
  Administered 2016-06-19: 500 mg via ORAL
  Filled 2016-06-19: qty 1

## 2016-06-19 NOTE — Discharge Instructions (Signed)
Follow up with the ENT doctor for packing removal.  If bleeding recurs, hold pressure for 30 minutes. If bleeding persists, return to the ED.

## 2017-03-24 ENCOUNTER — Emergency Department (HOSPITAL_COMMUNITY)
Admission: EM | Admit: 2017-03-24 | Discharge: 2017-03-24 | Disposition: A | Discharge status: Home / Self Care | Payer: BLUE CROSS/BLUE SHIELD | Attending: Emergency Medicine | Admitting: Emergency Medicine

## 2017-03-24 ENCOUNTER — Encounter (HOSPITAL_COMMUNITY): Payer: Self-pay | Admitting: Emergency Medicine

## 2017-03-24 ENCOUNTER — Emergency Department (HOSPITAL_COMMUNITY): Payer: BLUE CROSS/BLUE SHIELD

## 2017-03-24 DIAGNOSIS — Z8673 Personal history of transient ischemic attack (TIA), and cerebral infarction without residual deficits: Secondary | ICD-10-CM | POA: Insufficient documentation

## 2017-03-24 DIAGNOSIS — I2510 Atherosclerotic heart disease of native coronary artery without angina pectoris: Secondary | ICD-10-CM | POA: Insufficient documentation

## 2017-03-24 DIAGNOSIS — E039 Hypothyroidism, unspecified: Secondary | ICD-10-CM | POA: Diagnosis not present

## 2017-03-24 DIAGNOSIS — Z7982 Long term (current) use of aspirin: Secondary | ICD-10-CM | POA: Insufficient documentation

## 2017-03-24 DIAGNOSIS — I10 Essential (primary) hypertension: Secondary | ICD-10-CM | POA: Insufficient documentation

## 2017-03-24 DIAGNOSIS — G43109 Migraine with aura, not intractable, without status migrainosus: Secondary | ICD-10-CM | POA: Diagnosis not present

## 2017-03-24 DIAGNOSIS — Z79899 Other long term (current) drug therapy: Secondary | ICD-10-CM | POA: Diagnosis not present

## 2017-03-24 DIAGNOSIS — R531 Weakness: Secondary | ICD-10-CM

## 2017-03-24 DIAGNOSIS — Z862 Personal history of diseases of the blood and blood-forming organs and certain disorders involving the immune mechanism: Secondary | ICD-10-CM | POA: Diagnosis not present

## 2017-03-24 LAB — PROTIME-INR
INR: 1
Prothrombin Time: 13.1 seconds (ref 11.4–15.2)

## 2017-03-24 LAB — CBC
HCT: 39.4 % (ref 36.0–46.0)
Hemoglobin: 13 g/dL (ref 12.0–15.0)
MCH: 28.6 pg (ref 26.0–34.0)
MCHC: 33 g/dL (ref 30.0–36.0)
MCV: 86.6 fL (ref 78.0–100.0)
Platelets: 127 10*3/uL — ABNORMAL LOW (ref 150–400)
RBC: 4.55 MIL/uL (ref 3.87–5.11)
RDW: 13.2 % (ref 11.5–15.5)
WBC: 7.8 10*3/uL (ref 4.0–10.5)

## 2017-03-24 LAB — COMPREHENSIVE METABOLIC PANEL
ALT: 32 U/L (ref 14–54)
AST: 34 U/L (ref 15–41)
Albumin: 4.3 g/dL (ref 3.5–5.0)
Alkaline Phosphatase: 72 U/L (ref 38–126)
Anion gap: 12 (ref 5–15)
BUN: 17 mg/dL (ref 6–20)
CO2: 22 mmol/L (ref 22–32)
Calcium: 9.4 mg/dL (ref 8.9–10.3)
Chloride: 105 mmol/L (ref 101–111)
Creatinine, Ser: 0.68 mg/dL (ref 0.44–1.00)
GFR calc Af Amer: >60 mL/min (ref >60)
GFR calc non Af Amer: >60 mL/min (ref >60)
Glucose, Bld: 110 mg/dL — ABNORMAL HIGH (ref 65–99)
Potassium: 3.5 mmol/L (ref 3.5–5.1)
Sodium: 139 mmol/L (ref 135–145)
Total Bilirubin: 0.6 mg/dL (ref 0.3–1.2)
Total Protein: 7.4 g/dL (ref 6.5–8.1)

## 2017-03-24 LAB — I-STAT CHEM 8, ED
BUN: 24 mg/dL — ABNORMAL HIGH (ref 6–20)
Calcium, Ion: 1.04 mmol/L — ABNORMAL LOW (ref 1.15–1.40)
Chloride: 106 mmol/L (ref 101–111)
Creatinine, Ser: 0.5 mg/dL (ref 0.44–1.00)
Glucose, Bld: 113 mg/dL — ABNORMAL HIGH (ref 65–99)
HCT: 40 % (ref 36.0–46.0)
Hemoglobin: 13.6 g/dL (ref 12.0–15.0)
Potassium: 4.2 mmol/L (ref 3.5–5.1)
Sodium: 141 mmol/L (ref 135–145)
TCO2: 27 mmol/L (ref 22–32)

## 2017-03-24 LAB — DIFFERENTIAL
Basophils Absolute: 0 10*3/uL (ref 0.0–0.1)
Basophils Relative: 0 %
Eosinophils Absolute: 0.1 10*3/uL (ref 0.0–0.7)
Eosinophils Relative: 1 %
Lymphocytes Relative: 27 %
Lymphs Abs: 2.1 10*3/uL (ref 0.7–4.0)
Monocytes Absolute: 0.7 10*3/uL (ref 0.1–1.0)
Monocytes Relative: 9 %
Neutro Abs: 4.9 10*3/uL (ref 1.7–7.7)
Neutrophils Relative %: 63 %

## 2017-03-24 LAB — I-STAT TROPONIN, ED: Troponin i, poc: 0 ng/mL (ref 0.00–0.08)

## 2017-03-24 LAB — APTT: aPTT: 31 seconds (ref 24–36)

## 2017-03-24 LAB — ETHANOL: Alcohol, Ethyl (B): <10 mg/dL (ref <10)

## 2017-03-24 MED ORDER — PROCHLORPERAZINE EDISYLATE 5 MG/ML IJ SOLN
10.0000 mg | Freq: Once | INTRAMUSCULAR | Status: AC
  Administered 2017-03-24: 10 mg via INTRAVENOUS
  Filled 2017-03-24: qty 2

## 2017-03-24 MED ORDER — DIPHENHYDRAMINE HCL 50 MG/ML IJ SOLN
25.0000 mg | Freq: Once | INTRAMUSCULAR | Status: AC
  Administered 2017-03-24: 25 mg via INTRAVENOUS
  Filled 2017-03-24: qty 1

## 2017-03-24 NOTE — ED Provider Notes (Signed)
[facility] DEPT Provider Note   CSN: [id number] Arrival date & time: 03/24/17  1050   An emergency department physician performed an initial assessment on this suspected stroke patient at [address]).  History   Chief Complaint Chief Complaint  Patient presents with  . Code Stroke    HPI KELENA GARROW is a 61 y.o. female.  62 yo F with a chief complaint of left upper extremity weakness. This been going on since this morning though she has been having symptoms for the past couple days. She was made a code stroke on arrival. I saw her after CT. Patient continued to have left upper extremity weakness. Having a left-sided headache as well. Denies head injury denies fevers or chills denies trauma. She states that she does have a sensation that something is crawling on the left side of her face.   The history is provided by the patient.  Illness  This is a new problem. The current episode started less than 1 hour ago. The problem occurs constantly. The problem has not changed since onset.Pertinent negatives include no chest pain, no headaches and no shortness of breath. Nothing aggravates the symptoms. Nothing relieves the symptoms. She has tried nothing for the symptoms. The treatment provided no relief.    Past Medical History:  Diagnosis Date  . BV (bacterial vaginosis) 11/22/2013  . CAD (coronary artery disease) 02/2001   Nonobstructive per cath.  . Chronic anxiety   . Hyperlipidemia   . Hypertension   . Hypothyroidism 10/2006   Status post thyroidectomy for thyroid mass.  . Insomnia 05/10/2013  . Internal hemorrhoids 09/2003  . LLQ pain 10/09/2014  . Overactive bladder   . Seasonal allergies   . Stress 05/10/2013  . Stroke ([place])   . Thrombocytopenia (HCC)    Chronic  . Thrush, oral 11/22/2013  . Tubular adenoma   . Vaginal discharge 11/22/2013  . Vaginal odor 10/09/2014    Patient Active Problem List   Diagnosis Date Noted  . Vaginal odor 10/09/2014  . LLQ pain  10/09/2014  . Diarrhea 12/14/2013  . Loose stools 11/29/2013  . Nausea with vomiting 11/29/2013  . Vaginal discharge 11/22/2013  . BV (bacterial vaginosis) 11/22/2013  . Thrush, oral 11/22/2013  . Insomnia 05/10/2013  . Stress 05/10/2013  . Left sided numbness 10/28/2011  . Left-sided weakness 10/28/2011  . Thrombocytopenia ([place]) 10/28/2011  . Acute thrombotic stroke 10/28/2011  . Hypertension 10/28/2011  . Hyperlipidemia 10/28/2011    Past Surgical History:  Procedure Laterality Date  . ABDOMINAL HYSTERECTOMY    . APPENDECTOMY  11/2002.   Incidental during laparotomy and excision of ovarian remnant.  Marland Kitchen CARDIAC CATHETERIZATION  02/2001   Nonobstructive CAD  . COLONOSCOPY  09/2003   Per Dr. Gala Romney. Internal hemorrhoids.normal otherwise  . COLONOSCOPY N/A 12/04/2013   Dr. Gala Romney- normal rectum aside from minimal internal hemorrhoids. the patient had a 65mm polyp in the mid ascending segment and an 30mm poylp in the mid descending segment o/w the remaider of the colonic mucosa appeared normal. the distal 10cm of terminal mucosa also appeared normal.tubular adenoma on bx  . ESOPHAGOGASTRODUODENOSCOPY N/A 12/04/2013   Dr.Rourk- distal esophageal erosions w/i 68mm of the GE junction. no basrrett's esophagus. tubular esophagus patent throughout its course. focal antral erosions o/w normal appearing gastric mucosa. patent pylorus, normal first and second portion of the duodenum. mild chronic inflammation on bx  . INNER EAR SURGERY     ear drum  . LAPAROTOMY  11/2002   Excision  of ovarian remnant  . THYROIDECTOMY  10/2006   Neg for cancer per patient  . TOTAL ABDOMINAL HYSTERECTOMY W/ BILATERAL SALPINGOOPHORECTOMY  09/2001.    OB History    Gravida Para Term Preterm AB Living   2 2       2    SAB TAB Ectopic Multiple Live Births           2       Home Medications    Prior to Admission medications   Medication Sig Start Date End Date Taking? Authorizing Provider  ALPRAZolam Duanne Moron) 1 MG  tablet TAKE 1 TABLET BY MOUTH 3 TIMES A DAY AS NEEDED    Derrek Monaco A, NP  aspirin EC 81 MG tablet Take 81 mg by mouth daily.    [provider]  atorvastatin (LIPITOR) 40 MG tablet Take 40 mg by mouth daily.    [provider]  azithromycin (ZITHROMAX) 250 MG tablet Take 250 mg by mouth daily.    [provider]  cephALEXin (KEFLEX) 500 MG capsule Take 1 capsule (500 mg total) by mouth 3 (three) times daily. 06/19/16   Rancour, Annie Main, MD  cetirizine (ZYRTEC) 10 MG tablet Take 10 mg by mouth at bedtime.     [provider]  dicyclomine (BENTYL) 20 MG tablet Take one every 8 hours for abd cramps 07/24/15   Milton Ferguson, MD  esomeprazole (NEXIUM) 40 MG capsule Take 1 capsule (40 mg total) by mouth daily before breakfast. 12/12/13   Annitta Needs, NP  HYDROcodone-acetaminophen (NORCO/VICODIN) 5-325 MG tablet Take 1 tablet by mouth every 6 (six) hours as needed. 07/24/15   Milton Ferguson, MD  levothyroxine (SYNTHROID, LEVOTHROID) 50 MCG tablet Take 50 mcg by mouth daily before breakfast.    [provider]  losartan-hydrochlorothiazide (HYZAAR) 50-12.5 MG per tablet Take 1 tablet by mouth every morning.     [provider]  metroNIDAZOLE (METROGEL VAGINAL) 0.75 % vaginal gel Place 1 Applicatorful vaginally at bedtime. Patient not taking: Reported on 07/24/2015 10/09/14   Estill Dooms, NP  Multiple Vitamin (MULITIVITAMIN WITH MINERALS) TABS Take 1 tablet by mouth every morning.     [provider]  nystatin (MYCOSTATIN) 100000 UNIT/ML suspension Take 5 mLs (500,000 Units total) by mouth 4 (four) times daily. Patient not taking: Reported on 07/24/2015 10/09/14   Estill Dooms, NP  nystatin-triamcinolone ointment (MYCOLOG) APPLY TO AFFECTED AREA TWICE A DAY 07/29/15   Estill Dooms, NP  oxybutynin (DITROPAN-XL) 10 MG 24 hr tablet Take 10 mg by mouth every morning.    [provider]  pantoprazole (PROTONIX) 40 MG  tablet Take 1 tablet (40 mg total) by mouth daily. 12/13/13   Annitta Needs, NP  vitamin B-12 (CYANOCOBALAMIN) 1000 MCG tablet Take 1,000 mcg by mouth every morning.     [provider]  vitamin E 400 UNIT capsule Take 800 Units by mouth daily.     [provider]    Family History Family History  Problem Relation Age of Onset  . Heart disease Mother        CHF  . Cancer Father        prostate  . Stroke Brother   . Stroke Sister   . Heart disease Sister        CHF  . Multiple sclerosis Sister   . Stroke Daughter        light strokes  . Heart attack Sister   . Colon cancer Neg Hx  Social History Social History  Substance Use Topics  . Smoking status: Never Smoker  . Smokeless tobacco: Never Used     Comment: Never smoker  . Alcohol use No     Allergies   Patient has no known allergies.   Review of Systems Review of Systems  Constitutional: Negative for chills and fever.  HENT: Negative for congestion and rhinorrhea.   Eyes: Negative for redness and visual disturbance.  Respiratory: Negative for shortness of breath and wheezing.   Cardiovascular: Negative for chest pain and palpitations.  Gastrointestinal: Negative for nausea and vomiting.  Genitourinary: Negative for dysuria and urgency.  Musculoskeletal: Negative for arthralgias and myalgias.  Skin: Negative for pallor and wound.  Neurological: Positive for weakness (left upper extremity). Negative for dizziness and headaches.     Physical Exam Updated Vital Signs BP (!) 172/71   Pulse (!) 103   Temp 98.7 F (37.1 C) (Oral)   Resp 18   Ht 5' (1.524 m)   Wt 93 kg (205 lb)   SpO2 95%   BMI 40.04 kg/m   Physical Exam  Constitutional: She is oriented to person, place, and time. She appears well-developed and well-nourished. No distress.  HENT:  Head: Normocephalic and atraumatic.  Eyes: Pupils are equal, round, and reactive to light. EOM are normal.  Neck: Normal range of motion.  Neck supple.  Cardiovascular: Normal rate and regular rhythm.  Exam reveals no gallop and no friction rub.   No murmur heard. Pulmonary/Chest: Effort normal. She has no wheezes. She has no rales.  Abdominal: Soft. She exhibits no distension. There is no tenderness.  Musculoskeletal: She exhibits no edema or tenderness.  Neurological: She is alert and oriented to person, place, and time. No cranial nerve deficit or sensory deficit. She displays a negative Romberg sign. Coordination and gait normal.  Mild LUE weakness.   Skin: Skin is warm and dry. She is not diaphoretic.  Psychiatric: She has a normal mood and affect. Her behavior is normal.  Nursing note and vitals reviewed.    ED Treatments / Results  Labs (all labs ordered are listed, but only abnormal results are displayed) Labs Reviewed  CBC - Abnormal; Notable for the following:       Result Value   Platelets 127 (*)    All other components within normal limits  COMPREHENSIVE METABOLIC PANEL - Abnormal; Notable for the following:    Glucose, Bld 110 (*)    All other components within normal limits  I-STAT CHEM 8, ED - Abnormal; Notable for the following:    BUN 24 (*)    Glucose, Bld 113 (*)    Calcium, Ion 1.04 (*)    All other components within normal limits  ETHANOL  PROTIME-INR  APTT  DIFFERENTIAL  RAPID URINE DRUG SCREEN, HOSP PERFORMED  URINALYSIS, ROUTINE W REFLEX MICROSCOPIC  I-STAT TROPONIN, ED    EKG  EKG Interpretation None       Radiology Ct Head Code Stroke Wo Contrast  Result Date: 03/24/2017 CLINICAL DATA:  Code stroke. 61 year old female with left side weakness. Last seen normal 0800 hours. EXAM: CT HEAD WITHOUT CONTRAST TECHNIQUE: Contiguous axial images were obtained from the base of the skull through the vertex without intravenous contrast. COMPARISON:  Brain MRI and head CT 10/28/2011 FINDINGS: Brain: Cerebral volume is normal. No midline shift, ventriculomegaly, mass effect, evidence of mass  lesion, intracranial hemorrhage or evidence of cortically based acute infarction. Gray-white matter differentiation is within normal limits throughout the  brain. No cortical encephalomalacia identified. Vascular: Mild Calcified atherosclerosis at the skull base. No suspicious intracranial vascular hyperdensity. Skull: Intact.  No acute osseous abnormality identified. Sinuses/Orbits: Improved aeration of the right maxillary sinus since 2013. Well pneumatized overall. Other: Visualized orbit soft tissues are within normal limits. Visualized scalp soft tissues are within normal limits. ASPECTS [facility] Stroke Program Early CT Score) - Ganglionic level infarction (caudate, lentiform nuclei, internal capsule, insula, M1-M3 cortex): 7 - Supraganglionic infarction (M4-M6 cortex): 3 Total score (0-10 with 10 being normal): 10 IMPRESSION: 1. Normal noncontrast CT appearance of the brain. 2. ASPECTS is 10. 3. The above was relayed via text pager to Dr. Roland Rack on 03/24/2017 at 11:09 . Electronically Signed   By: Genevie Ann M.D.   On: 03/24/2017 11:10    Procedures Procedures (including critical care time)  Medications Ordered in ED Medications  prochlorperazine (COMPAZINE) injection 10 mg (10 mg Intravenous Given 03/24/17 1153)  diphenhydrAMINE (BENADRYL) injection 25 mg (25 mg Intravenous Given 03/24/17 1153)     Initial Impression / Assessment and Plan / ED Course  I have reviewed the triage vital signs and the nursing notes.  Pertinent labs & imaging results that were available during my care of the patient were reviewed by me and considered in my medical decision making (see chart for details).     61 yo F With a chief complaint of strokelike symptoms. Arrived as a code stroke. Given TPA with 2 days of symptoms. Neuro exam for the neurologist was inconsistent. Recommended MRI and headache cocktail.  Feeling better on reassessment.  Still feels weak on the left.  MR negative.  D/c home.   Of  note the patient says that she has had multiple episodes of these. She feels that she needs be written out of work for a prolonged time. I discussed that this likely needs to be filled out by her family physician.  3:12 PM:  I have discussed the diagnosis/risks/treatment options with the patient and family and believe the pt to be eligible for discharge home to follow-up with PCP, neuro. We also discussed returning to the ED immediately if new or worsening sx occur. We discussed the sx which are most concerning (e.g., sudden worsening pain, fever, inability to tolerate by mouth) that necessitate immediate return. Medications administered to the patient during their visit and any new prescriptions provided to the patient are listed below.  Medications given during this visit Medications  prochlorperazine (COMPAZINE) injection 10 mg (10 mg Intravenous Given 03/24/17 1153)  diphenhydrAMINE (BENADRYL) injection 25 mg (25 mg Intravenous Given 03/24/17 1153)     The patient appears reasonably screen and/or stabilized for discharge and I doubt any other medical condition or other [facility] requiring further screening, evaluation, or treatment in the ED at this time prior to discharge.     Final Clinical Impressions(s) / ED Diagnoses   Final diagnoses:  Left-sided weakness    New Prescriptions New Prescriptions   No medications on file     Deno Etienne, DO 03/24/17 1512

## 2017-03-24 NOTE — ED Notes (Signed)
Patient transported to MRI 

## 2017-03-24 NOTE — Code Documentation (Addendum)
61yo female arriving to [facility] via [place] at 1050.  Patient reporting waxing and waning of left sided symptoms since Monday.  Patient reports TIA x 3.  Patient noted left sided leg numbness this morning at 0800 followed by left sided weakness at 1000.  EMS also reports nausea and vomiting x 2.  Code stroke called by EMS.  Stroke team to the bedside on patient arrival.  Labs drawn and patient cleared for CT by Dr. Eulis Foster.  Patient to CT with team.  CT completed.  NIHSS 3, see documentation for details and code stroke times.  Patient with left arm and leg drift and subjective decreased sensation on the left side on exam.  Patient is outside the window for treatment with tPA d/t LKW on 03/22/2017 per neurologist.  No acute stroke treatment at this time.  Bedside handoff with ED RN Margreta Journey.

## 2017-03-24 NOTE — ED Notes (Signed)
Per neuro, q2H neuro checks

## 2017-03-24 NOTE — Discharge Instructions (Signed)
Follow up with your PCP and neurology.  Return for sudden worsening symptoms.

## 2017-03-26 ENCOUNTER — Encounter: Payer: Self-pay | Admitting: Neurology

## 2017-03-26 ENCOUNTER — Other Ambulatory Visit (HOSPITAL_COMMUNITY): Payer: Self-pay | Admitting: Internal Medicine

## 2017-03-26 DIAGNOSIS — Z1231 Encounter for screening mammogram for malignant neoplasm of breast: Secondary | ICD-10-CM

## 2017-04-05 ENCOUNTER — Ambulatory Visit (HOSPITAL_COMMUNITY)
Admission: RE | Admit: 2017-04-05 | Discharge: 2017-04-05 | Disposition: A | Discharge status: Home / Self Care | Payer: BLUE CROSS/BLUE SHIELD | Source: Ambulatory Visit | Attending: Internal Medicine | Admitting: Internal Medicine

## 2017-04-05 DIAGNOSIS — Z1231 Encounter for screening mammogram for malignant neoplasm of breast: Secondary | ICD-10-CM | POA: Insufficient documentation

## 2017-06-21 ENCOUNTER — Ambulatory Visit: Payer: BLUE CROSS/BLUE SHIELD | Admitting: Neurology

## 2018-04-26 ENCOUNTER — Ambulatory Visit (HOSPITAL_COMMUNITY): Payer: BLUE CROSS/BLUE SHIELD | Attending: Orthopaedic Surgery | Admitting: Occupational Therapy

## 2018-04-26 ENCOUNTER — Encounter (HOSPITAL_COMMUNITY): Payer: Self-pay | Admitting: Occupational Therapy

## 2018-04-26 ENCOUNTER — Telehealth (HOSPITAL_COMMUNITY): Payer: Self-pay | Admitting: Occupational Therapy

## 2018-04-26 ENCOUNTER — Other Ambulatory Visit: Payer: Self-pay

## 2018-04-26 DIAGNOSIS — M25511 Pain in right shoulder: Secondary | ICD-10-CM | POA: Insufficient documentation

## 2018-04-26 DIAGNOSIS — M25611 Stiffness of right shoulder, not elsewhere classified: Secondary | ICD-10-CM | POA: Insufficient documentation

## 2018-04-26 DIAGNOSIS — R29898 Other symptoms and signs involving the musculoskeletal system: Secondary | ICD-10-CM | POA: Insufficient documentation

## 2018-04-26 NOTE — Patient Instructions (Signed)

## 2018-04-26 NOTE — Telephone Encounter (Signed)
Called and fax OT order. NF 04/25/2018

## 2018-04-26 NOTE — Therapy (Addendum)
[place], [place], [address] Phone: [phone]   Fax:  [phone]  Occupational Therapy Evaluation  Patient Details  Name: Kristina Mcdonald MRN: [id number] Date of Birth: 24-Jul-1955 Referring Provider (OT): Ophelia Charter, MD   Encounter Date: 04/26/2018  OT End of Session - 04/26/18 1214    Visit Number  1    Number of Visits  16    Date for OT Re-Evaluation  06/21/18   Mini reassessment 05/24/18   Authorization Type  1) [employer]- $25 copay    Authorization - Visit Number  1    Authorization - Number of Visits  30    OT Start Time  7939    OT Stop Time  1200    OT Time Calculation (min)  39 min    Activity Tolerance  Patient tolerated treatment well    Behavior During Therapy  [facility] for tasks assessed/performed       Past Medical History:  Diagnosis Date  . BV (bacterial vaginosis) 11/22/2013  . CAD (coronary artery disease) 02/2001   Nonobstructive per cath.  . Chronic anxiety   . Hyperlipidemia   . Hypertension   . Hypothyroidism 10/2006   Status post thyroidectomy for thyroid mass.  . Insomnia 05/10/2013  . Internal hemorrhoids 09/2003  . LLQ pain 10/09/2014  . Overactive bladder   . Seasonal allergies   . Stress 05/10/2013  . Stroke ([place])   . Thrombocytopenia (HCC)    Chronic  . Thrush, oral 11/22/2013  . Tubular adenoma   . Vaginal discharge 11/22/2013  . Vaginal odor 10/09/2014    Past Surgical History:  Procedure Laterality Date  . ABDOMINAL HYSTERECTOMY    . APPENDECTOMY  11/2002.   Incidental during laparotomy and excision of ovarian remnant.  Marland Kitchen CARDIAC CATHETERIZATION  02/2001   Nonobstructive CAD  . COLONOSCOPY  09/2003   Per Dr. Gala Romney. Internal hemorrhoids.normal otherwise  . COLONOSCOPY N/A 12/04/2013   Dr. Gala Romney- normal rectum aside from minimal internal hemorrhoids. the patient had a 76mm polyp in the mid ascending segment and an 8mm poylp in the mid descending segment o/w the remaider of the  colonic mucosa appeared normal. the distal 10cm of terminal mucosa also appeared normal.tubular adenoma on bx  . ESOPHAGOGASTRODUODENOSCOPY N/A 12/04/2013   Dr.Rourk- distal esophageal erosions w/i 51mm of the GE junction. no basrrett's esophagus. tubular esophagus patent throughout its course. focal antral erosions o/w normal appearing gastric mucosa. patent pylorus, normal first and second portion of the duodenum. mild chronic inflammation on bx  . INNER EAR SURGERY     ear drum  . LAPAROTOMY  11/2002   Excision of ovarian remnant  . THYROIDECTOMY  10/2006   Neg for cancer per patient  . TOTAL ABDOMINAL HYSTERECTOMY W/ BILATERAL SALPINGOOPHORECTOMY  09/2001.    There were no vitals filed for this visit.  Subjective Assessment - 04/26/18 1231    Subjective   S: My pain is usually around a 7.      Pertinent History  Pt is a 62 y/o female that has been diagnosed with right shoulder tendonitis. Symptoms began approximately 3 months ago and make functional and pain-free ADL completion difficult. She has been referred for OT evaluation and treatment by Ophelia Charter, MD.     Special Tests  FOTO: 45/100    Patient Stated Goals  Return to normal and pain-free use of right dominant arm.      Currently in Pain?  Yes  Pain Score  7     Pain Location  Shoulder    Pain Orientation  Right    Pain Descriptors / Indicators  Aching    Pain Type  Acute pain    Pain Radiating Towards  Elbow    Pain Onset  More than a month ago    Pain Frequency  Intermittent    Aggravating Factors   Use of arm, especially lifting up and reaching back    Pain Relieving Factors  Medication     Effect of Pain on Daily Activities  Mod effect on daily activities.     Multiple Pain Sites  No        [facility] OT Assessment - 04/26/18 1124      Assessment   Medical Diagnosis  Right shoulder tendonitis     Referring Provider (OT)  Ophelia Charter, MD    Onset Date/Surgical Date  01/24/18    Hand Dominance  Right    Next MD Visit   06/07/18    Prior Therapy  None for this condition       Precautions   Precautions  None      Balance Screen   Has the patient fallen in the past 6 months  Yes    How many times?  1    Has the patient had a decrease in activity level because of a fear of falling?   No    Is the patient reluctant to leave their home because of a fear of falling?   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 Husband has been needing to help with toilet hygiene, showering, and dressing tasks.        Written Expression   Dominant Hand  Right      Vision - History   Baseline Vision  Wears glasses only for reading      Cognition   Overall Cognitive Status  Within Functional Limits for tasks assessed      Observation/Other Assessments   Focus on Therapeutic Outcomes (FOTO)   45/100      ROM / Strength   AROM / PROM / Strength  AROM;PROM;Strength      Palpation   Palpation comment  Mod fascial restrictions palpated in upper arm, trapezius and scapular regions.      AROM   Overall AROM   Deficits    Overall AROM Comments  Assessed seated; IR/eR adducted     AROM Assessment Site  Shoulder    Right/Left Shoulder  Right    Right Shoulder Flexion  100 Degrees    Right Shoulder ABduction  90 Degrees    Right Shoulder Internal Rotation  90 Degrees    Right Shoulder External Rotation  10 Degrees      PROM   Overall PROM   Deficits    Overall PROM Comments  Assessed supine; IR/eR adducted     PROM Assessment Site  Shoulder    Right/Left Shoulder  Right    Right Shoulder Flexion  110 Degrees    Right Shoulder ABduction  103 Degrees    Right Shoulder Internal Rotation  90 Degrees    Right Shoulder  External Rotation  25 Degrees      Strength    Overall Strength  Deficits    Overall Strength Comments  Assessed seated; IR/eR adducted     Strength Assessment Site  Shoulder    Right/Left Shoulder  Right    Right Shoulder Flexion  3+/5    Right Shoulder ABduction  3+/5    Right Shoulder Internal Rotation  3+/5    Right Shoulder External Rotation  3+/5                      OT Education - 04/26/18 1211    Education Details  Shoulder stretches HEP: No doorway stretch or wall abduction stretch. For wall flexion, walk fingers up wall and do not lean in.     Person(s) Educated  Patient    Methods  Explanation;Demonstration;Handout;Verbal cues    Comprehension  Verbalized understanding;Returned demonstration       OT Short Term Goals - 04/26/18 1223      OT SHORT TERM GOAL #1   Title  Pt will be provided with and educated on HEP for improved mobility required for ADL completion.     Time  4    Period  Weeks    Status  New    Target Date  05/24/18      OT SHORT TERM GOAL #2   Title  Pt will decrease pain in RUE to 4/10 or less to improve ability to reach behind back toilet hygiene tasks.      Time  4    Period  Weeks    Status  New      OT SHORT TERM GOAL #3   Title  Pt will increase RUE P/ROM to WNL to improve ability to reach overhead during bathing tasks.      Time  4    Period  Weeks    Status  New      OT SHORT TERM GOAL #4   Title  Pt will decrease fascial restrictions in RUE to minimal amounts or less to improve mobility required for functional reaching tasks.      Time  4    Period  Weeks    Status  New      OT SHORT TERM GOAL #5   Title  Pt will improve RUE strength to 4/5 to improve ability to care for grandchildren     Time  4    Period  Weeks    Status  New        OT Long Term Goals - 04/26/18 1242      OT LONG TERM GOAL #1   Title  Patient will return to highest level of independence while using her RUE for all daily tasks and being able to complete ADLs independently and without  assistance from husband.      Time  8    Period  Weeks    Status  New    Target Date  06/21/18      OT LONG TERM GOAL #2   Title  Pt will improve RUE strength to at least 4+/5 to improve ability to perform housekeeping tasks independently.      Time  8    Period  Weeks    Status  New      OT LONG TERM GOAL #3   Title  Pt will increase RUE A/ROM to [facility] to improve ability to reach behind back bathing tasks.     Time  8    Period  Weeks    Status  New      OT LONG TERM GOAL #4   Title  Patient will decrease pain level in RUE to 2/10 or less when completing grooming tasks.     Time  8    Period  Weeks    Status  New            Plan - 04/26/18 1220    Clinical Impression Statement  A: Pt is a 62 y/o female with right shoulder tendonitis, presenting with pain, ROM and strength limitations that limit her ability to perform ADLs and I/ADLs at his highest level of functioning. Pt educated on and practicing shoulder stretches HEP with modifications: No doorway stretch or wall abduction stretch. For wall flexion, walk fingers up wall and do not lean in due to pain.     Occupational Profile and client history currently impacting functional performance  Pt is motivated to return to highest level of function.     Occupational performance deficits (Please refer to evaluation for details):  ADL's;IADL's;Leisure;Social Participation;Rest and Sleep    Rehab Potential  Good    OT Frequency  2x / week    OT Duration  8 weeks    OT Treatment/Interventions  Self-care/ADL training;Moist Heat;DME and/or AE instruction;Contrast Bath;Therapeutic activities;Ultrasound;Therapeutic exercise;Cryotherapy;Passive range of motion;Electrical Stimulation;Manual Therapy;Patient/family education    Plan  P: Pt will benefit from skilled OT services to decrease pain and fascial restrictions, improve ROM, strength, and functional use of RUE during daily tasks. Treatment plan: myofascial release, manual therapy,  P/ROM, AA/ROM, A/ROM, scapular stability and strengthening.    Clinical Decision Making  Limited treatment options, no task modification necessary    OT Home Exercise Plan  Shoulder stretches     Consulted and Agree with Plan of Care  Patient       Patient will benefit from skilled therapeutic intervention in order to improve the following deficits and impairments:  Decreased range of motion, Decreased activity tolerance, Increased fascial restrictions, Impaired UE functional use, Pain, Decreased strength  Visit Diagnosis: Acute pain of right shoulder  Stiffness of right shoulder, not elsewhere classified  Other symptoms and signs involving the musculoskeletal system    Problem List Patient Active Problem List   Diagnosis Date Noted  . Vaginal odor 10/09/2014  . LLQ pain 10/09/2014  . Diarrhea 12/14/2013  . Loose stools 11/29/2013  . Nausea with vomiting 11/29/2013  . Vaginal discharge 11/22/2013  . BV (bacterial vaginosis) 11/22/2013  . Thrush, oral 11/22/2013  . Insomnia 05/10/2013  . Stress 05/10/2013  . Left sided numbness 10/28/2011  . Left-sided weakness 10/28/2011  . Thrombocytopenia ([place]) 10/28/2011  . Acute thrombotic stroke ([place]) 10/28/2011  . Hypertension 10/28/2011  . Hyperlipidemia 10/28/2011    Toney Rakes, OT student 04/26/2018, 3:07 PM  [place] [address] [place], [place], [address] Phone: [phone]   Fax:  [phone]  Name: Kristina Mcdonald MRN: [id number] Date of Birth: January 10, 1956

## 2018-04-27 ENCOUNTER — Telehealth (HOSPITAL_COMMUNITY): Payer: Self-pay

## 2018-04-27 NOTE — Telephone Encounter (Signed)
Pt is going out of town for their wedding anniversary

## 2018-04-29 ENCOUNTER — Ambulatory Visit (HOSPITAL_COMMUNITY): Payer: BLUE CROSS/BLUE SHIELD

## 2018-05-03 ENCOUNTER — Encounter (HOSPITAL_COMMUNITY): Payer: Self-pay | Admitting: Occupational Therapy

## 2018-05-03 ENCOUNTER — Ambulatory Visit (HOSPITAL_COMMUNITY): Payer: BLUE CROSS/BLUE SHIELD | Attending: Orthopaedic Surgery | Admitting: Occupational Therapy

## 2018-05-03 DIAGNOSIS — M25511 Pain in right shoulder: Secondary | ICD-10-CM

## 2018-05-03 DIAGNOSIS — R29898 Other symptoms and signs involving the musculoskeletal system: Secondary | ICD-10-CM

## 2018-05-03 DIAGNOSIS — M25611 Stiffness of right shoulder, not elsewhere classified: Secondary | ICD-10-CM | POA: Diagnosis present

## 2018-05-03 NOTE — Therapy (Signed)
[place], [place], [address] Phone: [phone]   Fax:  [phone]  Occupational Therapy Treatment  Patient Details  Name: Kristina Mcdonald MRN: [id number] Date of Birth: [birth year] Referring Provider (OT): Ophelia Charter, MD   Encounter Date: 05/03/2018  OT End of Session - 05/03/18 1450    Visit Number  2    Number of Visits  16    Date for OT Re-Evaluation  06/21/18   Mini reassessment 05/24/18   Authorization Type  1) [employer]- $25 copay    Authorization - Visit Number  2    Authorization - Number of Visits  30    OT Start Time  [phone]    OT Stop Time  1032    OT Time Calculation (min)  45 min    Activity Tolerance  Patient tolerated treatment well    Behavior During Therapy  [facility] for tasks assessed/performed       Past Medical History:  Diagnosis Date  . BV (bacterial vaginosis) 11/22/2013  . CAD (coronary artery disease) 02/2001   Nonobstructive per cath.  . Chronic anxiety   . Hyperlipidemia   . Hypertension   . Hypothyroidism 10/2006   Status post thyroidectomy for thyroid mass.  . Insomnia 05/10/2013  . Internal hemorrhoids 09/2003  . LLQ pain 10/09/2014  . Overactive bladder   . Seasonal allergies   . Stress 05/10/2013  . Stroke ([place])   . Thrombocytopenia (HCC)    Chronic  . Thrush, oral 11/22/2013  . Tubular adenoma   . Vaginal discharge 11/22/2013  . Vaginal odor 10/09/2014    Past Surgical History:  Procedure Laterality Date  . ABDOMINAL HYSTERECTOMY    . APPENDECTOMY  11/2002.   Incidental during laparotomy and excision of ovarian remnant.  Marland Kitchen CARDIAC CATHETERIZATION  02/2001   Nonobstructive CAD  . COLONOSCOPY  09/2003   Per Dr. Gala Romney. Internal hemorrhoids.normal otherwise  . COLONOSCOPY N/A 12/04/2013   Dr. Gala Romney- normal rectum aside from minimal internal hemorrhoids. the patient had a 62mm polyp in the mid ascending segment and an 89mm poylp in the mid descending segment o/w the remaider of the  colonic mucosa appeared normal. the distal 10cm of terminal mucosa also appeared normal.tubular adenoma on bx  . ESOPHAGOGASTRODUODENOSCOPY N/A 12/04/2013   Dr.Rourk- distal esophageal erosions w/i 18mm of the GE junction. no basrrett's esophagus. tubular esophagus patent throughout its course. focal antral erosions o/w normal appearing gastric mucosa. patent pylorus, normal first and second portion of the duodenum. mild chronic inflammation on bx  . INNER EAR SURGERY     ear drum  . LAPAROTOMY  11/2002   Excision of ovarian remnant  . THYROIDECTOMY  10/2006   Neg for cancer per patient  . TOTAL ABDOMINAL HYSTERECTOMY W/ BILATERAL SALPINGOOPHORECTOMY  09/2001.    There were no vitals filed for this visit.  Subjective Assessment - 05/03/18 1024    Subjective   S: My doctor gave me a new medicine that has been helping a little with the pain.     Currently in Pain?  Yes    Pain Score  5     Pain Location  Shoulder    Pain Orientation  Right    Pain Descriptors / Indicators  Burning    Pain Type  Acute pain    Pain Radiating Towards  Elbow    Pain Onset  More than a month ago    Pain Frequency  Intermittent  Aggravating Factors   Lifting and reaching arm    Pain Relieving Factors  Medication    Effect of Pain on Daily Activities  Mod effect on daily activities.     Multiple Pain Sites  No         [facility] OT Assessment - 05/03/18 0949      Assessment   Medical Diagnosis  Right shoulder tendonitis       Precautions   Precautions  None               OT Treatments/Exercises (OP) - 05/03/18 0950      Exercises   Exercises  Shoulder      Shoulder Exercises: Supine   Protraction  PROM;5 reps;AAROM;10 reps    Horizontal ABduction  PROM;5 reps;AAROM;10 reps    External Rotation  PROM;5 reps;AAROM;10 reps    Internal Rotation  PROM;5 reps;AAROM;10 reps    Flexion  PROM;5 reps;AAROM;10 reps   To shoulder level   ABduction  PROM;5 reps;AAROM      Shoulder Exercises:  Standing   Extension  AROM;10 reps    Retraction  AROM;10 reps      Shoulder Exercises: Therapy Ball   Flexion  Both;10 reps    ABduction  Right;10 reps      Shoulder Exercises: Isometric Strengthening   Flexion  Supine;3X5"    External Rotation  Supine;3X5"    Internal Rotation  Supine;3X5";Limitations   pain noted   ABduction  Supine;3X5"      Manual Therapy   Manual Therapy  Myofascial release    Manual therapy comments  Completed separately from therapeutic exercise     Myofascial Release  Myofascial release to right upper arm, deltoid, and trapezius regions to decrease pain and fascial restrictions and increase joint range of motion.                OT Education - 05/03/18 1438    Education Details  Updated shoulder stretches HEP to include doorway stretch and wall abduction stretch     Person(s) Educated  Patient    Methods  Explanation;Demonstration;Handout;Verbal cues    Comprehension  Verbalized understanding;Returned demonstration       OT Short Term Goals - 05/03/18 1453      OT SHORT TERM GOAL #1   Title  Pt will be provided with and educated on HEP for improved mobility required for ADL completion.     Time  4    Period  Weeks    Status  On-going      OT SHORT TERM GOAL #2   Title  Pt will decrease pain in RUE to 4/10 or less to improve ability to reach behind back toilet hygiene tasks.      Time  4    Period  Weeks    Status  On-going      OT SHORT TERM GOAL #3   Title  Pt will increase RUE P/ROM to WNL to improve ability to reach overhead during bathing tasks.      Time  4    Period  Weeks    Status  On-going      OT SHORT TERM GOAL #4   Title  Pt will decrease fascial restrictions in RUE to minimal amounts or less to improve mobility required for functional reaching tasks.      Time  4    Period  Weeks    Status  On-going      OT SHORT TERM GOAL #5  Title  Pt will improve RUE strength to 4/5 to improve ability to care for grandchildren      Time  4    Period  Weeks    Status  On-going        OT Long Term Goals - 05/03/18 1454      OT LONG TERM GOAL #1   Title  Patient will return to highest level of independence while using her RUE for all daily tasks and being able to complete ADLs independently and without assistance from husband.      Time  8    Period  Weeks    Status  On-going      OT LONG TERM GOAL #2   Title  Pt will improve RUE strength to at least 4+/5 to improve ability to perform housekeeping tasks independently.      Time  8    Period  Weeks    Status  On-going      OT LONG TERM GOAL #3   Title  Pt will increase RUE A/ROM to [facility] to improve ability to reach behind back bathing tasks.     Time  8    Period  Weeks    Status  On-going      OT LONG TERM GOAL #4   Title  Patient will decrease pain level in RUE to 2/10 or less when completing grooming tasks.     Time  8    Period  Weeks    Status  On-going      OT LONG TERM GOAL #5   Status  On-going            Plan - 05/03/18 1439    Clinical Impression Statement  A: Completed isometric strengthening and then progressed to A/AROM with some pain noted. Answered patient's questions to clarify HEP and updated shoulder stretches HEP to include doorway stretch and wall abduction stretch. Pt verbalized and demonstrated understanding of all education provided. Verbal cuing for form.                               Plan  P: Continue with manual therapy to address myofascial restrictions. Continue shoulder exercises to work on scapular stability and strengthening. Add A/AROM in standing and attempt A/ROM supine as tolerated.         OT Home Exercise Plan  Updated shoulder stretches HEP    Consulted and Agree with Plan of Care  Patient       Patient will benefit from skilled therapeutic intervention in order to improve the following deficits and impairments:  Decreased range of motion, Decreased activity tolerance, Increased fascial restrictions,  Impaired UE functional use, Pain, Decreased strength  Visit Diagnosis: Acute pain of right shoulder  Stiffness of right shoulder, not elsewhere classified  Other symptoms and signs involving the musculoskeletal system    Problem List Patient Active Problem List   Diagnosis Date Noted  . Vaginal odor 10/09/2014  . LLQ pain 10/09/2014  . Diarrhea 12/14/2013  . Loose stools 11/29/2013  . Nausea with vomiting 11/29/2013  . Vaginal discharge 11/22/2013  . BV (bacterial vaginosis) 11/22/2013  . Thrush, oral 11/22/2013  . Insomnia 05/10/2013  . Stress 05/10/2013  . Left sided numbness 10/28/2011  . Left-sided weakness 10/28/2011  . Thrombocytopenia ([place]) 10/28/2011  . Acute thrombotic stroke ([place]) 10/28/2011  . Hypertension 10/28/2011  . Hyperlipidemia 10/28/2011    Toney Rakes 05/03/2018, 2:54 PM  [place], [place], [address] Phone: [phone]   Fax:  [phone]  Name: Kristina Mcdonald MRN: [id number] Date of Birth: 01/16/1956

## 2018-05-05 ENCOUNTER — Telehealth (HOSPITAL_COMMUNITY): Payer: Self-pay | Admitting: Family Medicine

## 2018-05-05 ENCOUNTER — Ambulatory Visit (HOSPITAL_COMMUNITY): Payer: BLUE CROSS/BLUE SHIELD

## 2018-05-05 NOTE — Telephone Encounter (Signed)
pt sick and vomiting  05/05/18

## 2018-05-10 ENCOUNTER — Ambulatory Visit (HOSPITAL_COMMUNITY): Payer: BLUE CROSS/BLUE SHIELD | Admitting: Occupational Therapy

## 2018-05-10 ENCOUNTER — Encounter (HOSPITAL_COMMUNITY): Payer: Self-pay | Admitting: Occupational Therapy

## 2018-05-10 DIAGNOSIS — M25511 Pain in right shoulder: Secondary | ICD-10-CM

## 2018-05-10 DIAGNOSIS — M25611 Stiffness of right shoulder, not elsewhere classified: Secondary | ICD-10-CM

## 2018-05-10 DIAGNOSIS — R29898 Other symptoms and signs involving the musculoskeletal system: Secondary | ICD-10-CM

## 2018-05-10 NOTE — Therapy (Signed)
[place], [place], [address] Phone: [phone]   Fax:  [phone]  Occupational Therapy Treatment  Patient Details  Name: Kristina Mcdonald MRN: [id number] Date of Birth: [birth year] Referring Provider (OT): Ophelia Charter, MD   Encounter Date: 05/10/2018  OT End of Session - 05/10/18 1112    Visit Number  3    Number of Visits  16    Date for OT Re-Evaluation  06/21/18   Mini reassessment 05/24/18   Authorization Type  1) [employer]- $25 copay    Authorization - Visit Number  3    Authorization - Number of Visits  30    OT Start Time  0102    OT Stop Time  1110    OT Time Calculation (min)  42 min    Activity Tolerance  Patient tolerated treatment well    Behavior During Therapy  [facility] for tasks assessed/performed       Past Medical History:  Diagnosis Date  . BV (bacterial vaginosis) 11/22/2013  . CAD (coronary artery disease) 02/2001   Nonobstructive per cath.  . Chronic anxiety   . Hyperlipidemia   . Hypertension   . Hypothyroidism 10/2006   Status post thyroidectomy for thyroid mass.  . Insomnia 05/10/2013  . Internal hemorrhoids 09/2003  . LLQ pain 10/09/2014  . Overactive bladder   . Seasonal allergies   . Stress 05/10/2013  . Stroke ([place])   . Thrombocytopenia (HCC)    Chronic  . Thrush, oral 11/22/2013  . Tubular adenoma   . Vaginal discharge 11/22/2013  . Vaginal odor 10/09/2014    Past Surgical History:  Procedure Laterality Date  . ABDOMINAL HYSTERECTOMY    . APPENDECTOMY  11/2002.   Incidental during laparotomy and excision of ovarian remnant.  Marland Kitchen CARDIAC CATHETERIZATION  02/2001   Nonobstructive CAD  . COLONOSCOPY  09/2003   Per Dr. Gala Romney. Internal hemorrhoids.normal otherwise  . COLONOSCOPY N/A 12/04/2013   Dr. Gala Romney- normal rectum aside from minimal internal hemorrhoids. the patient had a 108mm polyp in the mid ascending segment and an 73mm poylp in the mid descending segment o/w the remaider of the  colonic mucosa appeared normal. the distal 10cm of terminal mucosa also appeared normal.tubular adenoma on bx  . ESOPHAGOGASTRODUODENOSCOPY N/A 12/04/2013   Dr.Rourk- distal esophageal erosions w/i 65mm of the GE junction. no basrrett's esophagus. tubular esophagus patent throughout its course. focal antral erosions o/w normal appearing gastric mucosa. patent pylorus, normal first and second portion of the duodenum. mild chronic inflammation on bx  . INNER EAR SURGERY     ear drum  . LAPAROTOMY  11/2002   Excision of ovarian remnant  . THYROIDECTOMY  10/2006   Neg for cancer per patient  . TOTAL ABDOMINAL HYSTERECTOMY W/ BILATERAL SALPINGOOPHORECTOMY  09/2001.    There were no vitals filed for this visit.  Subjective Assessment - 05/10/18 1028    Subjective   S: It's actually getting better, I can tell.     Currently in Pain?  Yes    Pain Score  3     Pain Location  Shoulder    Pain Orientation  Right    Pain Descriptors / Indicators  Aching;Sore    Pain Type  Acute pain    Pain Radiating Towards  none    Pain Onset  More than a month ago    Pain Frequency  Intermittent    Aggravating Factors   lifting and reaching arm  Pain Relieving Factors  medication    Effect of Pain on Daily Activities  mod effect on ADLs    Multiple Pain Sites  No         [facility] OT Assessment - 05/10/18 1028      Assessment   Medical Diagnosis  Right shoulder tendonitis       Precautions   Precautions  None               OT Treatments/Exercises (OP) - 05/10/18 1031      Exercises   Exercises  Shoulder      Shoulder Exercises: Supine   Protraction  PROM;5 reps;AAROM;12 reps    Horizontal ABduction  PROM;5 reps;AROM;12 reps    External Rotation  PROM;5 reps;AAROM;12 reps    Internal Rotation  PROM;5 reps;AAROM;12 reps    Flexion  PROM;5 reps;AAROM;12 reps    ABduction  PROM;5 reps;AAROM;12 reps      Shoulder Exercises: Standing   Protraction  AAROM;12 reps    Horizontal ABduction   AAROM;12 reps    External Rotation  AAROM;12 reps    Internal Rotation  AAROM;12 reps    Flexion  AAROM;12 reps    ABduction  AAROM;12 reps    Extension  Theraband;10 reps    Theraband Level (Shoulder Extension)  Level 2 (Red)    Row  Theraband;10 reps    Theraband Level (Shoulder Row)  Level 2 (Red)      Shoulder Exercises: Pulleys   ABduction  1 minute      Shoulder Exercises: ROM/Strengthening   Proximal Shoulder Strengthening, Supine  10X each, no rest breaks      Shoulder Exercises: Stretch   Cross Chest Stretch  2 reps;10 seconds    Internal Rotation Stretch  2 reps   10 seconds, horizontal towel   External Rotation Stretch  2 reps;10 seconds    Wall Stretch - ABduction  2 reps;10 seconds      Manual Therapy   Manual Therapy  Myofascial release    Manual therapy comments  Completed separately from therapeutic exercise     Myofascial Release  Myofascial release to right upper arm, deltoid, and trapezius regions to decrease pain and fascial restrictions and increase joint range of motion.                  OT Short Term Goals - 05/03/18 1453      OT SHORT TERM GOAL #1   Title  Pt will be provided with and educated on HEP for improved mobility required for ADL completion.     Time  4    Period  Weeks    Status  On-going      OT SHORT TERM GOAL #2   Title  Pt will decrease pain in RUE to 4/10 or less to improve ability to reach behind back toilet hygiene tasks.      Time  4    Period  Weeks    Status  On-going      OT SHORT TERM GOAL #3   Title  Pt will increase RUE P/ROM to WNL to improve ability to reach overhead during bathing tasks.      Time  4    Period  Weeks    Status  On-going      OT SHORT TERM GOAL #4   Title  Pt will decrease fascial restrictions in RUE to minimal amounts or less to improve mobility required for functional reaching tasks.  Time  4    Period  Weeks    Status  On-going      OT SHORT TERM GOAL #5   Title  Pt will improve  RUE strength to 4/5 to improve ability to care for grandchildren     Time  4    Period  Weeks    Status  On-going        OT Long Term Goals - 05/03/18 1454      OT LONG TERM GOAL #1   Title  Patient will return to highest level of independence while using her RUE for all daily tasks and being able to complete ADLs independently and without assistance from husband.      Time  8    Period  Weeks    Status  On-going      OT LONG TERM GOAL #2   Title  Pt will improve RUE strength to at least 4+/5 to improve ability to perform housekeeping tasks independently.      Time  8    Period  Weeks    Status  On-going      OT LONG TERM GOAL #3   Title  Pt will increase RUE A/ROM to [facility] to improve ability to reach behind back bathing tasks.     Time  8    Period  Weeks    Status  On-going      OT LONG TERM GOAL #4   Title  Patient will decrease pain level in RUE to 2/10 or less when completing grooming tasks.     Time  8    Period  Weeks    Status  On-going      OT LONG TERM GOAL #5   Status  On-going            Plan - 05/10/18 1047    Clinical Impression Statement  A: Pt reports she has really been able to tell an improvement in her arm, she did react to a cat jumping last night and it's been sore since then. Manual therapy completed to RUE to reduce pain and fascial restrictions limiting ROM. Continued with AA/ROM supine, with exception of horizontal abduction which was completed in A/ROM. Added AA/ROM in standing, abduction limited to approximately 75%. Added shoulder stretches and scapular theraband this session. Verbal cuing for form and technique.     Plan  P: continue with manual therapy, attempt A/ROM in supine, continue working on form during exercise completion       Patient will benefit from skilled therapeutic intervention in order to improve the following deficits and impairments:  Decreased range of motion, Decreased activity tolerance, Increased fascial restrictions,  Impaired UE functional use, Pain, Decreased strength  Visit Diagnosis: Acute pain of right shoulder  Stiffness of right shoulder, not elsewhere classified  Other symptoms and signs involving the musculoskeletal system    Problem List Patient Active Problem List   Diagnosis Date Noted  . Vaginal odor 10/09/2014  . LLQ pain 10/09/2014  . Diarrhea 12/14/2013  . Loose stools 11/29/2013  . Nausea with vomiting 11/29/2013  . Vaginal discharge 11/22/2013  . BV (bacterial vaginosis) 11/22/2013  . Thrush, oral 11/22/2013  . Insomnia 05/10/2013  . Stress 05/10/2013  . Left sided numbness 10/28/2011  . Left-sided weakness 10/28/2011  . Thrombocytopenia ([place]) 10/28/2011  . Acute thrombotic stroke ([place]) 10/28/2011  . Hypertension 10/28/2011  . Hyperlipidemia 10/28/2011   Guadelupe Sabin, OTR/L  [phone] 05/10/2018, 11:13 AM  [place]  [facility] [place], [place], [address] Phone: [phone]   Fax:  [phone]  Name: Kristina Mcdonald MRN: [id number] Date of Birth: Jan 08, 1956

## 2018-05-12 ENCOUNTER — Ambulatory Visit (HOSPITAL_COMMUNITY): Payer: BLUE CROSS/BLUE SHIELD | Admitting: Occupational Therapy

## 2018-05-12 ENCOUNTER — Encounter (HOSPITAL_COMMUNITY): Payer: Self-pay | Admitting: Occupational Therapy

## 2018-05-12 DIAGNOSIS — M25511 Pain in right shoulder: Secondary | ICD-10-CM | POA: Diagnosis not present

## 2018-05-12 DIAGNOSIS — R29898 Other symptoms and signs involving the musculoskeletal system: Secondary | ICD-10-CM

## 2018-05-12 DIAGNOSIS — M25611 Stiffness of right shoulder, not elsewhere classified: Secondary | ICD-10-CM

## 2018-05-12 NOTE — Therapy (Signed)
[place], [place], [address] Phone: [phone]   Fax:  [phone]  Occupational Therapy Treatment  Patient Details  Name: Kristina Mcdonald MRN: [id number] Date of Birth: August 07, 1955 Referring Provider (OT): Ophelia Charter, MD   Encounter Date: 05/12/2018  OT End of Session - 05/12/18 1140    Visit Number  4    Number of Visits  16    Date for OT Re-Evaluation  06/21/18   Mini reassessment 05/24/18   Authorization Type  1) [employer]- $25 copay    Authorization - Visit Number  4    Authorization - Number of Visits  30    OT Start Time  1101    OT Stop Time  1144    OT Time Calculation (min)  43 min    Activity Tolerance  Patient tolerated treatment well    Behavior During Therapy  [facility] for tasks assessed/performed       Past Medical History:  Diagnosis Date  . BV (bacterial vaginosis) 11/22/2013  . CAD (coronary artery disease) 02/2001   Nonobstructive per cath.  . Chronic anxiety   . Hyperlipidemia   . Hypertension   . Hypothyroidism 10/2006   Status post thyroidectomy for thyroid mass.  . Insomnia 05/10/2013  . Internal hemorrhoids 09/2003  . LLQ pain 10/09/2014  . Overactive bladder   . Seasonal allergies   . Stress 05/10/2013  . Stroke ([place])   . Thrombocytopenia (HCC)    Chronic  . Thrush, oral 11/22/2013  . Tubular adenoma   . Vaginal discharge 11/22/2013  . Vaginal odor 10/09/2014    Past Surgical History:  Procedure Laterality Date  . ABDOMINAL HYSTERECTOMY    . APPENDECTOMY  11/2002.   Incidental during laparotomy and excision of ovarian remnant.  Marland Kitchen CARDIAC CATHETERIZATION  02/2001   Nonobstructive CAD  . COLONOSCOPY  09/2003   Per Dr. Gala Romney. Internal hemorrhoids.normal otherwise  . COLONOSCOPY N/A 12/04/2013   Dr. Gala Romney- normal rectum aside from minimal internal hemorrhoids. the patient had a 76mm polyp in the mid ascending segment and an 28mm poylp in the mid descending segment o/w the remaider of the  colonic mucosa appeared normal. the distal 10cm of terminal mucosa also appeared normal.tubular adenoma on bx  . ESOPHAGOGASTRODUODENOSCOPY N/A 12/04/2013   Dr.Rourk- distal esophageal erosions w/i 49mm of the GE junction. no basrrett's esophagus. tubular esophagus patent throughout its course. focal antral erosions o/w normal appearing gastric mucosa. patent pylorus, normal first and second portion of the duodenum. mild chronic inflammation on bx  . INNER EAR SURGERY     ear drum  . LAPAROTOMY  11/2002   Excision of ovarian remnant  . THYROIDECTOMY  10/2006   Neg for cancer per patient  . TOTAL ABDOMINAL HYSTERECTOMY W/ BILATERAL SALPINGOOPHORECTOMY  09/2001.    There were no vitals filed for this visit.  Subjective Assessment - 05/12/18 1100    Subjective   S: My arm is a little stiff but it's not painful.     Currently in Pain?  No/denies         [facility] OT Assessment - 05/12/18 1100      Assessment   Medical Diagnosis  Right shoulder tendonitis       Precautions   Precautions  None               OT Treatments/Exercises (OP) - 05/12/18 1103      Exercises   Exercises  Shoulder  Shoulder Exercises: Supine   Protraction  PROM;5 reps;AROM;12 reps    Horizontal ABduction  PROM;5 reps;AROM;12 reps    External Rotation  PROM;5 reps;AROM;12 reps    Internal Rotation  PROM;5 reps;AROM;12 reps    Flexion  PROM;5 reps;AROM;12 reps    ABduction  PROM;5 reps;AROM;12 reps      Shoulder Exercises: Standing   Protraction  AROM;12 reps    Horizontal ABduction  AROM;12 reps    External Rotation  AROM;12 reps    Internal Rotation  AROM;12 reps    Flexion  AROM;12 reps    ABduction  AROM;12 reps    Extension  Theraband;10 reps    Theraband Level (Shoulder Extension)  Level 2 (Red)    Row  Theraband;10 reps    Theraband Level (Shoulder Row)  Level 2 (Red)    Retraction  Theraband;10 reps    Theraband Level (Shoulder Retraction)  Level 2 (Red)      Shoulder Exercises:  ROM/Strengthening   UBE (Upper Arm Bike)  Level 1 2' foward 2' reverse   Pace 4.0-5.0   Wall Wash  1' overhead level    Proximal Shoulder Strengthening, Supine  10X each, no rest breaks    Other ROM/Strengthening Exercises  Pt passed tennis ball behind back working on IR, 10X      Shoulder Exercises: Stretch   Cross Chest Stretch  3 reps;10 seconds    Internal Rotation Stretch  3 reps   10" hold horizontal towel   External Rotation Stretch  3 reps;10 seconds    Wall Stretch - Flexion  3 reps;10 seconds    Wall Stretch - ABduction  3 reps;10 seconds      Manual Therapy   Manual Therapy  Myofascial release    Manual therapy comments  Completed separately from therapeutic exercise     Myofascial Release  Myofascial release to right upper arm, deltoid, and trapezius regions to decrease pain and fascial restrictions and increase joint range of motion.                  OT Short Term Goals - 05/03/18 1453      OT SHORT TERM GOAL #1   Title  Pt will be provided with and educated on HEP for improved mobility required for ADL completion.     Time  4    Period  Weeks    Status  On-going      OT SHORT TERM GOAL #2   Title  Pt will decrease pain in RUE to 4/10 or less to improve ability to reach behind back toilet hygiene tasks.      Time  4    Period  Weeks    Status  On-going      OT SHORT TERM GOAL #3   Title  Pt will increase RUE P/ROM to WNL to improve ability to reach overhead during bathing tasks.      Time  4    Period  Weeks    Status  On-going      OT SHORT TERM GOAL #4   Title  Pt will decrease fascial restrictions in RUE to minimal amounts or less to improve mobility required for functional reaching tasks.      Time  4    Period  Weeks    Status  On-going      OT SHORT TERM GOAL #5   Title  Pt will improve RUE strength to 4/5 to improve ability to care for grandchildren  Time  4    Period  Weeks    Status  On-going        OT Long Term Goals -  05/03/18 1454      OT LONG TERM GOAL #1   Title  Patient will return to highest level of independence while using her RUE for all daily tasks and being able to complete ADLs independently and without assistance from husband.      Time  8    Period  Weeks    Status  On-going      OT LONG TERM GOAL #2   Title  Pt will improve RUE strength to at least 4+/5 to improve ability to perform housekeeping tasks independently.      Time  8    Period  Weeks    Status  On-going      OT LONG TERM GOAL #3   Title  Pt will increase RUE A/ROM to [facility] to improve ability to reach behind back bathing tasks.     Time  8    Period  Weeks    Status  On-going      OT LONG TERM GOAL #4   Title  Patient will decrease pain level in RUE to 2/10 or less when completing grooming tasks.     Time  8    Period  Weeks    Status  On-going      OT LONG TERM GOAL #5   Status  On-going            Plan - 05/12/18 1140    Clinical Impression Statement  A: Continued with manual therapy working to reduce fascial restrictions causing increased pain and ROM limitations. Pt able to tolerate P/ROM [facility] today, also progressing to A/ROM in supine and standing and reaching [facility] with exception of er. Added scapular theraband retraction and UBE today, min/mod fatigue at end of session. Verbal cuing during session for form and technique.     Plan  P: Continue with manual therapy and A/ROM, add x to v arms and continue working on IR/er improvement in ROM. Update HEP for A/ROM       Patient will benefit from skilled therapeutic intervention in order to improve the following deficits and impairments:  Decreased range of motion, Decreased activity tolerance, Increased fascial restrictions, Impaired UE functional use, Pain, Decreased strength  Visit Diagnosis: Acute pain of right shoulder  Stiffness of right shoulder, not elsewhere classified  Other symptoms and signs involving the musculoskeletal system    Problem  List Patient Active Problem List   Diagnosis Date Noted  . Vaginal odor 10/09/2014  . LLQ pain 10/09/2014  . Diarrhea 12/14/2013  . Loose stools 11/29/2013  . Nausea with vomiting 11/29/2013  . Vaginal discharge 11/22/2013  . BV (bacterial vaginosis) 11/22/2013  . Thrush, oral 11/22/2013  . Insomnia 05/10/2013  . Stress 05/10/2013  . Left sided numbness 10/28/2011  . Left-sided weakness 10/28/2011  . Thrombocytopenia ([place]) 10/28/2011  . Acute thrombotic stroke ([place]) 10/28/2011  . Hypertension 10/28/2011  . Hyperlipidemia 10/28/2011   Guadelupe Sabin, OTR/L  [phone] 05/12/2018, 11:52 AM  [place] [address] [place], [place], [address] Phone: [phone]   Fax:  [phone]  Name: Kristina Mcdonald MRN: [id number] Date of Birth: 09-26-1955

## 2018-05-19 ENCOUNTER — Encounter (HOSPITAL_COMMUNITY): Payer: Self-pay | Admitting: Occupational Therapy

## 2018-05-19 ENCOUNTER — Ambulatory Visit (HOSPITAL_COMMUNITY): Payer: BLUE CROSS/BLUE SHIELD | Admitting: Occupational Therapy

## 2018-05-19 DIAGNOSIS — M25611 Stiffness of right shoulder, not elsewhere classified: Secondary | ICD-10-CM

## 2018-05-19 DIAGNOSIS — M25511 Pain in right shoulder: Secondary | ICD-10-CM | POA: Diagnosis not present

## 2018-05-19 DIAGNOSIS — R29898 Other symptoms and signs involving the musculoskeletal system: Secondary | ICD-10-CM

## 2018-05-19 NOTE — Therapy (Signed)
[place], [place], [address] Phone: [phone]   Fax:  [phone]  Occupational Therapy Treatment  Patient Details  Name: Kristina Mcdonald MRN: [id number] Date of Birth: 1956/04/25 Referring Provider (OT): Ophelia Charter, MD   Encounter Date: 05/19/2018  OT End of Session - 05/19/18 1027    Visit Number  5    Number of Visits  16    Date for OT Re-Evaluation  06/21/18   Mini reassessment 05/24/18   Authorization Type  1) [employer]- $25 copay    Authorization - Visit Number  5    Authorization - Number of Visits  30    OT Start Time  0945    OT Stop Time  1027    OT Time Calculation (min)  42 min    Activity Tolerance  Patient tolerated treatment well    Behavior During Therapy  [facility] for tasks assessed/performed       Past Medical History:  Diagnosis Date  . BV (bacterial vaginosis) 11/22/2013  . CAD (coronary artery disease) 02/2001   Nonobstructive per cath.  . Chronic anxiety   . Hyperlipidemia   . Hypertension   . Hypothyroidism 10/2006   Status post thyroidectomy for thyroid mass.  . Insomnia 05/10/2013  . Internal hemorrhoids 09/2003  . LLQ pain 10/09/2014  . Overactive bladder   . Seasonal allergies   . Stress 05/10/2013  . Stroke ([place])   . Thrombocytopenia (HCC)    Chronic  . Thrush, oral 11/22/2013  . Tubular adenoma   . Vaginal discharge 11/22/2013  . Vaginal odor 10/09/2014    Past Surgical History:  Procedure Laterality Date  . ABDOMINAL HYSTERECTOMY    . APPENDECTOMY  11/2002.   Incidental during laparotomy and excision of ovarian remnant.  Marland Kitchen CARDIAC CATHETERIZATION  02/2001   Nonobstructive CAD  . COLONOSCOPY  09/2003   Per Dr. Gala Romney. Internal hemorrhoids.normal otherwise  . COLONOSCOPY N/A 12/04/2013   Dr. Gala Romney- normal rectum aside from minimal internal hemorrhoids. the patient had a 46mm polyp in the mid ascending segment and an 51mm poylp in the mid descending segment o/w the remaider of the  colonic mucosa appeared normal. the distal 10cm of terminal mucosa also appeared normal.tubular adenoma on bx  . ESOPHAGOGASTRODUODENOSCOPY N/A 12/04/2013   Dr.Rourk- distal esophageal erosions w/i 55mm of the GE junction. no basrrett's esophagus. tubular esophagus patent throughout its course. focal antral erosions o/w normal appearing gastric mucosa. patent pylorus, normal first and second portion of the duodenum. mild chronic inflammation on bx  . INNER EAR SURGERY     ear drum  . LAPAROTOMY  11/2002   Excision of ovarian remnant  . THYROIDECTOMY  10/2006   Neg for cancer per patient  . TOTAL ABDOMINAL HYSTERECTOMY W/ BILATERAL SALPINGOOPHORECTOMY  09/2001.    There were no vitals filed for this visit.  Subjective Assessment - 05/19/18 0946    Subjective   S: My arm is feeling much better, I can move it more now.     Currently in Pain?  No/denies         [facility] OT Assessment - 05/19/18 0945      Assessment   Medical Diagnosis  Right shoulder tendonitis       Precautions   Precautions  None               OT Treatments/Exercises (OP) - 05/19/18 0946      Exercises   Exercises  Shoulder  Shoulder Exercises: Supine   Protraction  PROM;5 reps;AROM;12 reps    Horizontal ABduction  PROM;5 reps;AROM;12 reps    External Rotation  PROM;5 reps;AROM;12 reps    Internal Rotation  PROM;5 reps;AROM;12 reps    Flexion  PROM;5 reps;AROM;12 reps    ABduction  PROM;5 reps;AROM;12 reps      Shoulder Exercises: Sidelying   External Rotation  AROM;10 reps    Internal Rotation  AROM;10 reps    Flexion  AROM;10 reps    ABduction  AROM;10 reps    Other Sidelying Exercises  protraction, A/ROM, 10X    Other Sidelying Exercises  horizontal abduction, A/ROM, 10X      Shoulder Exercises: Standing   Protraction  AROM;12 reps    Horizontal ABduction  AROM;12 reps    External Rotation  AROM;12 reps    Internal Rotation  AROM;12 reps    Flexion  AROM;12 reps    ABduction  AROM;12 reps     Extension  Theraband;10 reps    Theraband Level (Shoulder Extension)  Level 2 (Red)    Row  Theraband;10 reps    Theraband Level (Shoulder Row)  Level 2 (Red)    Retraction  Theraband;10 reps    Theraband Level (Shoulder Retraction)  Level 2 (Red)      Shoulder Exercises: ROM/Strengthening   X to V Arms  10X     Proximal Shoulder Strengthening, Supine  10X each, no rest breaks    Other ROM/Strengthening Exercises  Pt passed tennis ball behind back working on IR, 10X      Shoulder Exercises: Stretch   Cross Chest Stretch  3 reps;10 seconds    Internal Rotation Stretch  3 reps   10" horizontal towel   External Rotation Stretch  3 reps;10 seconds    Wall Stretch - Flexion  3 reps;10 seconds    Wall Stretch - ABduction  3 reps;10 seconds      Manual Therapy   Manual Therapy  Myofascial release    Manual therapy comments  Completed separately from therapeutic exercise     Myofascial Release  Myofascial release to right upper arm, deltoid, and trapezius regions to decrease pain and fascial restrictions and increase joint range of motion.                OT Education - 05/19/18 1008    Education Details  A/ROM exercises    Person(s) Educated  Patient    Methods  Explanation;Demonstration;Handout    Comprehension  Verbalized understanding;Returned demonstration       OT Short Term Goals - 05/03/18 1453      OT SHORT TERM GOAL #1   Title  Pt will be provided with and educated on HEP for improved mobility required for ADL completion.     Time  4    Period  Weeks    Status  On-going      OT SHORT TERM GOAL #2   Title  Pt will decrease pain in RUE to 4/10 or less to improve ability to reach behind back toilet hygiene tasks.      Time  4    Period  Weeks    Status  On-going      OT SHORT TERM GOAL #3   Title  Pt will increase RUE P/ROM to WNL to improve ability to reach overhead during bathing tasks.      Time  4    Period  Weeks    Status  On-going  OT SHORT  TERM GOAL #4   Title  Pt will decrease fascial restrictions in RUE to minimal amounts or less to improve mobility required for functional reaching tasks.      Time  4    Period  Weeks    Status  On-going      OT SHORT TERM GOAL #5   Title  Pt will improve RUE strength to 4/5 to improve ability to care for grandchildren     Time  4    Period  Weeks    Status  On-going        OT Long Term Goals - 05/03/18 1454      OT LONG TERM GOAL #1   Title  Patient will return to highest level of independence while using her RUE for all daily tasks and being able to complete ADLs independently and without assistance from husband.      Time  8    Period  Weeks    Status  On-going      OT LONG TERM GOAL #2   Title  Pt will improve RUE strength to at least 4+/5 to improve ability to perform housekeeping tasks independently.      Time  8    Period  Weeks    Status  On-going      OT LONG TERM GOAL #3   Title  Pt will increase RUE A/ROM to [facility] to improve ability to reach behind back bathing tasks.     Time  8    Period  Weeks    Status  On-going      OT LONG TERM GOAL #4   Title  Patient will decrease pain level in RUE to 2/10 or less when completing grooming tasks.     Time  8    Period  Weeks    Status  On-going      OT LONG TERM GOAL #5   Status  On-going            Plan - 05/19/18 1009    Clinical Impression Statement  A: Min fascial restrictions palpated at anterior deltoid and trapezius regions today, manual therapy completed to address. Continued with A/ROM in supine and standing, added A/ROM in sidelying and x to v arms for improved scapular stability. Continued with scapular theraband, pt requiring tactile cuing for proper scapular mobility. Verbal cuing for form and technique during session.     Plan  P: Mini-reassessment. Follow up on HEP, add ball on wall, continue with sidelying A/ROM       Patient will benefit from skilled therapeutic intervention in order to improve  the following deficits and impairments:  Decreased range of motion, Decreased activity tolerance, Increased fascial restrictions, Impaired UE functional use, Pain, Decreased strength  Visit Diagnosis: Acute pain of right shoulder  Stiffness of right shoulder, not elsewhere classified  Other symptoms and signs involving the musculoskeletal system    Problem List Patient Active Problem List   Diagnosis Date Noted  . Vaginal odor 10/09/2014  . LLQ pain 10/09/2014  . Diarrhea 12/14/2013  . Loose stools 11/29/2013  . Nausea with vomiting 11/29/2013  . Vaginal discharge 11/22/2013  . BV (bacterial vaginosis) 11/22/2013  . Thrush, oral 11/22/2013  . Insomnia 05/10/2013  . Stress 05/10/2013  . Left sided numbness 10/28/2011  . Left-sided weakness 10/28/2011  . Thrombocytopenia ([place]) 10/28/2011  . Acute thrombotic stroke ([place]) 10/28/2011  . Hypertension 10/28/2011  . Hyperlipidemia 10/28/2011   Guadelupe Sabin, OTR/L  [phone] 05/19/2018, 10:29 AM  [place] [address] [place], [place], [address] Phone: [phone]   Fax:  [phone]  Name: Kristina Mcdonald MRN: [id number] Date of Birth: Nov 06, 1955

## 2018-05-19 NOTE — Patient Instructions (Signed)

## 2018-05-24 ENCOUNTER — Encounter (HOSPITAL_COMMUNITY): Payer: Self-pay | Admitting: Occupational Therapy

## 2018-05-24 ENCOUNTER — Ambulatory Visit (HOSPITAL_COMMUNITY): Payer: BLUE CROSS/BLUE SHIELD | Admitting: Occupational Therapy

## 2018-05-24 DIAGNOSIS — M25511 Pain in right shoulder: Secondary | ICD-10-CM

## 2018-05-24 DIAGNOSIS — M25611 Stiffness of right shoulder, not elsewhere classified: Secondary | ICD-10-CM

## 2018-05-24 DIAGNOSIS — R29898 Other symptoms and signs involving the musculoskeletal system: Secondary | ICD-10-CM

## 2018-05-24 NOTE — Therapy (Signed)
[place] [address] [place], [place], [address] Phone: [phone]   Fax:  [phone]  Occupational Therapy Treatment (mini-reassessment)  Patient Details  Name: Kristina Mcdonald MRN: [id number] Date of Birth: 1955/09/21 Referring Provider (OT): Ophelia Charter, MD  Progress Note Reporting Period 04/26/2018 to 05/27/2018  See note below for Objective Data and Assessment of Progress/Goals.       Encounter Date: 05/24/2018  OT End of Session - 05/24/18 1201    Visit Number  6    Number of Visits  16    Date for OT Re-Evaluation  06/21/18    Authorization Type  1) [employer]- $25 copay    Authorization - Visit Number  6    Authorization - Number of Visits  30    OT Start Time  [phone]    OT Stop Time  1030    OT Time Calculation (min)  41 min    Activity Tolerance  Patient tolerated treatment well    Behavior During Therapy  WFL for tasks assessed/performed       Past Medical History:  Diagnosis Date  . BV (bacterial vaginosis) 11/22/2013  . CAD (coronary artery disease) 02/2001   Nonobstructive per cath.  . Chronic anxiety   . Hyperlipidemia   . Hypertension   . Hypothyroidism 10/2006   Status post thyroidectomy for thyroid mass.  . Insomnia 05/10/2013  . Internal hemorrhoids 09/2003  . LLQ pain 10/09/2014  . Overactive bladder   . Seasonal allergies   . Stress 05/10/2013  . Stroke ([place])   . Thrombocytopenia (HCC)    Chronic  . Thrush, oral 11/22/2013  . Tubular adenoma   . Vaginal discharge 11/22/2013  . Vaginal odor 10/09/2014    Past Surgical History:  Procedure Laterality Date  . ABDOMINAL HYSTERECTOMY    . APPENDECTOMY  11/2002.   Incidental during laparotomy and excision of ovarian remnant.  Marland Kitchen CARDIAC CATHETERIZATION  02/2001   Nonobstructive CAD  . COLONOSCOPY  09/2003   Per Dr. Gala Romney. Internal hemorrhoids.normal otherwise  . COLONOSCOPY N/A 12/04/2013   Dr. Gala Romney- normal rectum aside from minimal internal  hemorrhoids. the patient had a 66m polyp in the mid ascending segment and an 844mpoylp in the mid descending segment o/w the remaider of the colonic mucosa appeared normal. the distal 10cm of terminal mucosa also appeared normal.tubular adenoma on bx  . ESOPHAGOGASTRODUODENOSCOPY N/A 12/04/2013   Dr.Rourk- distal esophageal erosions w/i 8m56mf the GE junction. no basrrett's esophagus. tubular esophagus patent throughout its course. focal antral erosions o/w normal appearing gastric mucosa. patent pylorus, normal first and second portion of the duodenum. mild chronic inflammation on bx  . INNER EAR SURGERY     ear drum  . LAPAROTOMY  11/2002   Excision of ovarian remnant  . THYROIDECTOMY  10/2006   Neg for cancer per patient  . TOTAL ABDOMINAL HYSTERECTOMY W/ BILATERAL SALPINGOOPHORECTOMY  09/2001.    There were no vitals filed for this visit.  Subjective Assessment - 05/24/18 0949    Subjective   S: I haven't had any pain this week since I saw you last.     Currently in Pain?  No/denies         [facility]      Assessment   Medical Diagnosis  Right shoulder tendonitis       Precautions   Precautions  None      Observation/Other Assessments   Focus on Therapeutic  Outcomes (FOTO)   61/100      Palpation   Palpation comment  Min fascial restrictions palpated in upper arm, trapezius and scapular regions.      AROM   Overall AROM Comments  Assessed seated; IR/er adducted     AROM Assessment Site  Shoulder    Right/Left Shoulder  Right    Right Shoulder Flexion  138 Degrees   100 previous   Right Shoulder ABduction  155 Degrees   90 previous   Right Shoulder Internal Rotation  90 Degrees   same as previous   Right Shoulder External Rotation  42 Degrees   10 previous     PROM   Overall PROM Comments  Assessed supine; IR/eR adducted     PROM Assessment Site  Shoulder    Right/Left Shoulder  Right    Right Shoulder Flexion  149 Degrees   110 previous    Right Shoulder ABduction  180 Degrees   103 previous   Right Shoulder Internal Rotation  90 Degrees   same as previous   Right Shoulder External Rotation  37 Degrees   25 previous     Strength   Overall Strength Comments  Assessed seated; IR/er adducted     Strength Assessment Site  Shoulder    Right/Left Shoulder  Right    Right Shoulder Flexion  4/5   3+/5 previous   Right Shoulder ABduction  4-/5   4-/5 previous   Right Shoulder Internal Rotation  4-/5   3+/5 previous   Right Shoulder External Rotation  3+/5   same as previous              OT Treatments/Exercises (OP) - 05/24/18 0953      Exercises   Exercises  Shoulder      Shoulder Exercises: Supine   Protraction  PROM;5 reps;AROM;12 reps    Horizontal ABduction  PROM;5 reps;AROM;12 reps    External Rotation  PROM;5 reps;AROM;12 reps    Internal Rotation  PROM;5 reps;AROM;12 reps    Flexion  PROM;5 reps;AROM;12 reps    ABduction  PROM;5 reps;AROM;12 reps      Shoulder Exercises: Sidelying   External Rotation  AROM;10 reps    Internal Rotation  AROM;10 reps    Flexion  AROM;10 reps    ABduction  AROM;10 reps    Other Sidelying Exercises  protraction, A/ROM, 10X    Other Sidelying Exercises  horizontal abduction, A/ROM, 10X      Shoulder Exercises: Standing   Protraction  AROM;12 reps    Horizontal ABduction  AROM;12 reps    External Rotation  AROM;12 reps    Internal Rotation  AROM;12 reps    Flexion  AROM;12 reps    ABduction  AROM;12 reps      Shoulder Exercises: ROM/Strengthening   X to V Arms  10X     Proximal Shoulder Strengthening, Seated  10X each no rest breaks    Ball on Wall  1' flexion    Other ROM/Strengthening Exercises  Pt passed tennis ball behind back working on IR, 10X      Shoulder Exercises: Stretch   External Rotation Stretch  3 reps;10 seconds      Manual Therapy   Manual Therapy  Myofascial release    Manual therapy comments  Completed separately from therapeutic exercise      Myofascial Release  Myofascial release to right upper arm, deltoid, and trapezius regions to decrease pain and fascial restrictions and increase joint range of  motion.                  OT Short Term Goals - 05/24/18 1202      OT SHORT TERM GOAL #1   Title  Pt will be provided with and educated on HEP for improved mobility required for ADL completion.     Time  4    Period  Weeks    Status  On-going      OT SHORT TERM GOAL #2   Title  Pt will decrease pain in RUE to 4/10 or less to improve ability to reach behind back toilet hygiene tasks.      Time  4    Period  Weeks    Status  Achieved      OT SHORT TERM GOAL #3   Title  Pt will increase RUE P/ROM to WNL to improve ability to reach overhead during bathing tasks.      Time  4    Period  Weeks    Status  Partially Met      OT SHORT TERM GOAL #4   Title  Pt will decrease fascial restrictions in RUE to minimal amounts or less to improve mobility required for functional reaching tasks.      Time  4    Period  Weeks    Status  Achieved      OT SHORT TERM GOAL #5   Title  Pt will improve RUE strength to 4/5 to improve ability to care for grandchildren     Time  4    Period  Weeks    Status  Partially Met        OT Long Term Goals - 05/24/18 1202      OT LONG TERM GOAL #1   Title  Patient will return to highest level of independence while using her RUE for all daily tasks and being able to complete ADLs independently and without assistance from husband.      Time  8    Period  Weeks    Status  On-going      OT LONG TERM GOAL #2   Title  Pt will improve RUE strength to at least 4+/5 to improve ability to perform housekeeping tasks independently.      Time  8    Period  Weeks    Status  On-going      OT LONG TERM GOAL #3   Title  Pt will increase RUE A/ROM to [facility] to improve ability to reach behind back bathing tasks.     Time  8    Period  Weeks    Status  Achieved      OT LONG TERM GOAL #4   Title   Patient will decrease pain level in RUE to 2/10 or less when completing grooming tasks.     Time  8    Period  Weeks    Status  On-going            Plan - 05/24/18 1201    Clinical Impression Statement  A: Mini-reassessment completed this session, pt has met 2/5 STGs with 2 additional STGs partially met, as well as meeting 1 LTG thus far. Pt is making good progress towards remaining goals. Continued with manual therapy to address fascial restictions this session, also continue with ROM exercises with improvement in stability during sidelying work. Added ball on wall, pt with mod difficulty keeping ball at shoulder height. Verbal cuing for form and  technqiue.     Plan  P: Add 1# weight in supine, resume theraband exercises and continue working on shoulder strength, stability, and ROM       Patient will benefit from skilled therapeutic intervention in order to improve the following deficits and impairments:  Decreased range of motion, Decreased activity tolerance, Increased fascial restrictions, Impaired UE functional use, Pain, Decreased strength  Visit Diagnosis: Acute pain of right shoulder  Stiffness of right shoulder, not elsewhere classified  Other symptoms and signs involving the musculoskeletal system    Problem List Patient Active Problem List   Diagnosis Date Noted  . Vaginal odor 10/09/2014  . LLQ pain 10/09/2014  . Diarrhea 12/14/2013  . Loose stools 11/29/2013  . Nausea with vomiting 11/29/2013  . Vaginal discharge 11/22/2013  . BV (bacterial vaginosis) 11/22/2013  . Thrush, oral 11/22/2013  . Insomnia 05/10/2013  . Stress 05/10/2013  . Left sided numbness 10/28/2011  . Left-sided weakness 10/28/2011  . Thrombocytopenia ([place]) 10/28/2011  . Acute thrombotic stroke ([place]) 10/28/2011  . Hypertension 10/28/2011  . Hyperlipidemia 10/28/2011   Guadelupe Sabin, OTR/L  [phone] 05/24/2018, 12:05 PM  [facility] [address] [place], [place], [address] Phone: [phone]   Fax:  [phone]  Name: Kristina Mcdonald MRN: [id number] Date of Birth: 28-Jun-1956

## 2018-05-25 ENCOUNTER — Ambulatory Visit (HOSPITAL_COMMUNITY): Payer: BLUE CROSS/BLUE SHIELD | Admitting: Occupational Therapy

## 2018-05-25 ENCOUNTER — Encounter (HOSPITAL_COMMUNITY): Payer: Self-pay | Admitting: Occupational Therapy

## 2018-05-25 DIAGNOSIS — R29898 Other symptoms and signs involving the musculoskeletal system: Secondary | ICD-10-CM

## 2018-05-25 DIAGNOSIS — M25511 Pain in right shoulder: Secondary | ICD-10-CM | POA: Diagnosis not present

## 2018-05-25 DIAGNOSIS — M25611 Stiffness of right shoulder, not elsewhere classified: Secondary | ICD-10-CM

## 2018-05-25 NOTE — Therapy (Signed)
[place], [place], [address] Phone: [phone]   Fax:  [phone]  Occupational Therapy Treatment  Patient Details  Name: Kristina Mcdonald MRN: [id number] Date of Birth: 1955-09-05 Referring Provider (OT): Ophelia Charter, MD   Encounter Date: 05/25/2018  OT End of Session - 05/25/18 1026    Visit Number  7    Number of Visits  16    Date for OT Re-Evaluation  06/21/18    Authorization Type  1) [employer]- $25 copay    Authorization - Visit Number  7    Authorization - Number of Visits  30    OT Start Time  [phone]    OT Stop Time  1027    OT Time Calculation (min)  40 min    Activity Tolerance  Patient tolerated treatment well    Behavior During Therapy  [facility] for tasks assessed/performed       Past Medical History:  Diagnosis Date  . BV (bacterial vaginosis) 11/22/2013  . CAD (coronary artery disease) 02/2001   Nonobstructive per cath.  . Chronic anxiety   . Hyperlipidemia   . Hypertension   . Hypothyroidism 10/2006   Status post thyroidectomy for thyroid mass.  . Insomnia 05/10/2013  . Internal hemorrhoids 09/2003  . LLQ pain 10/09/2014  . Overactive bladder   . Seasonal allergies   . Stress 05/10/2013  . Stroke ([place])   . Thrombocytopenia (HCC)    Chronic  . Thrush, oral 11/22/2013  . Tubular adenoma   . Vaginal discharge 11/22/2013  . Vaginal odor 10/09/2014    Past Surgical History:  Procedure Laterality Date  . ABDOMINAL HYSTERECTOMY    . APPENDECTOMY  11/2002.   Incidental during laparotomy and excision of ovarian remnant.  Marland Kitchen CARDIAC CATHETERIZATION  02/2001   Nonobstructive CAD  . COLONOSCOPY  09/2003   Per Dr. Gala Romney. Internal hemorrhoids.normal otherwise  . COLONOSCOPY N/A 12/04/2013   Dr. Gala Romney- normal rectum aside from minimal internal hemorrhoids. the patient had a 57m polyp in the mid ascending segment and an 868mpoylp in the mid descending segment o/w the remaider of the colonic mucosa appeared  normal. the distal 10cm of terminal mucosa also appeared normal.tubular adenoma on bx  . ESOPHAGOGASTRODUODENOSCOPY N/A 12/04/2013   Dr.Rourk- distal esophageal erosions w/i 33m66mf the GE junction. no basrrett's esophagus. tubular esophagus patent throughout its course. focal antral erosions o/w normal appearing gastric mucosa. patent pylorus, normal first and second portion of the duodenum. mild chronic inflammation on bx  . INNER EAR SURGERY     ear drum  . LAPAROTOMY  11/2002   Excision of ovarian remnant  . THYROIDECTOMY  10/2006   Neg for cancer per patient  . TOTAL ABDOMINAL HYSTERECTOMY W/ BILATERAL SALPINGOOPHORECTOMY  09/2001.    There were no vitals filed for this visit.  Subjective Assessment - 05/25/18 0947    Subjective   S: No pain at all today.     Currently in Pain?  No/denies         [facility] - 05/25/18 0947      Assessment   Medical Diagnosis  Right shoulder tendonitis       Precautions   Precautions  None               OT Treatments/Exercises (OP) - 05/25/18 0949      Exercises   Exercises  Shoulder      Shoulder Exercises: Supine   Protraction  PROM;5 reps;Strengthening;12 reps    Protraction Weight (lbs)  1    Horizontal ABduction  PROM;5 reps;Strengthening;12 reps    Horizontal ABduction Weight (lbs)  1    External Rotation  PROM;5 reps;Strengthening;12 reps    External Rotation Weight (lbs)  1    Internal Rotation  PROM;5 reps;Strengthening;12 reps    Internal Rotation Weight (lbs)  1    Flexion  PROM;5 reps;Strengthening;12 reps    Shoulder Flexion Weight (lbs)  1    ABduction  PROM;5 reps;Strengthening;12 reps    Shoulder ABduction Weight (lbs)  1      Shoulder Exercises: Standing   Protraction  Strengthening;12 reps    Protraction Weight (lbs)  1    Horizontal ABduction  Strengthening;12 reps    Horizontal ABduction Weight (lbs)  1    External Rotation  Strengthening;12 reps    External Rotation Weight (lbs)  1     Internal Rotation  Strengthening;12 reps    Internal Rotation Weight (lbs)  1    Flexion  Strengthening;12 reps    Shoulder Flexion Weight (lbs)  1    ABduction  Strengthening;12 reps    Shoulder ABduction Weight (lbs)  1    Extension  Theraband;12 reps    Theraband Level (Shoulder Extension)  Level 2 (Red)    Row  Theraband;12 reps    Theraband Level (Shoulder Row)  Level 2 (Red)    Retraction  Theraband;12 reps    Theraband Level (Shoulder Retraction)  Level 2 (Red)      Shoulder Exercises: ROM/Strengthening   UBE (Upper Arm Bike)  Level 2 2' forward 2' reverse   pace 4.5-5.5   X to V Arms  12X, 1#    Proximal Shoulder Strengthening, Supine  10X each, 1#, no rest breaks    Proximal Shoulder Strengthening, Seated  10X, 1# no rest breaks    Ball on Wall  1' flexion, 1' abduction    Other ROM/Strengthening Exercises  red loop band: lateral wall slides, 10X      Shoulder Exercises: Stretch   External Rotation Stretch  3 reps;10 seconds      Manual Therapy   Manual Therapy  Myofascial release    Manual therapy comments  Completed separately from therapeutic exercise     Myofascial Release  Myofascial release to right upper arm, deltoid, and trapezius regions to decrease pain and fascial restrictions and increase joint range of motion.                  OT Short Term Goals - 05/24/18 1202      OT SHORT TERM GOAL #1   Title  Pt will be provided with and educated on HEP for improved mobility required for ADL completion.     Time  4    Period  Weeks    Status  On-going      OT SHORT TERM GOAL #2   Title  Pt will decrease pain in RUE to 4/10 or less to improve ability to reach behind back toilet hygiene tasks.      Time  4    Period  Weeks    Status  Achieved      OT SHORT TERM GOAL #3   Title  Pt will increase RUE P/ROM to WNL to improve ability to reach overhead during bathing tasks.      Time  4    Period  Weeks    Status  Partially Met  OT SHORT TERM GOAL #4    Title  Pt will decrease fascial restrictions in RUE to minimal amounts or less to improve mobility required for functional reaching tasks.      Time  4    Period  Weeks    Status  Achieved      OT SHORT TERM GOAL #5   Title  Pt will improve RUE strength to 4/5 to improve ability to care for grandchildren     Time  4    Period  Weeks    Status  Partially Met        OT Long Term Goals - 05/24/18 1202      OT LONG TERM GOAL #1   Title  Patient will return to highest level of independence while using her RUE for all daily tasks and being able to complete ADLs independently and without assistance from husband.      Time  8    Period  Weeks    Status  On-going      OT LONG TERM GOAL #2   Title  Pt will improve RUE strength to at least 4+/5 to improve ability to perform housekeeping tasks independently.      Time  8    Period  Weeks    Status  On-going      OT LONG TERM GOAL #3   Title  Pt will increase RUE A/ROM to [facility] to improve ability to reach behind back bathing tasks.     Time  8    Period  Weeks    Status  Achieved      OT LONG TERM GOAL #4   Title  Patient will decrease pain level in RUE to 2/10 or less when completing grooming tasks.     Time  8    Period  Weeks    Status  On-going            Plan - 05/25/18 1024    Clinical Impression Statement  A: Continued with manual therapy to address fascial restrictions in trapezius and anterior deloid, min restrictions palpated this session. Added 1# weight to supine and standing exercises, also added red loop band and ball on wall in abduction. Pt remains most limited with er. Verbal cuing for form and technique required, occasional rest breaks. Pt with min fatigue at end of session.     Plan  P: continue with shoulder and scapular stability and strengthening required for functional tasks. MET for er during passive stretching       Patient will benefit from skilled therapeutic intervention in order to improve the  following deficits and impairments:  Decreased range of motion, Decreased activity tolerance, Increased fascial restrictions, Impaired UE functional use, Pain, Decreased strength  Visit Diagnosis: Acute pain of right shoulder  Stiffness of right shoulder, not elsewhere classified  Other symptoms and signs involving the musculoskeletal system    Problem List Patient Active Problem List   Diagnosis Date Noted  . Vaginal odor 10/09/2014  . LLQ pain 10/09/2014  . Diarrhea 12/14/2013  . Loose stools 11/29/2013  . Nausea with vomiting 11/29/2013  . Vaginal discharge 11/22/2013  . BV (bacterial vaginosis) 11/22/2013  . Thrush, oral 11/22/2013  . Insomnia 05/10/2013  . Stress 05/10/2013  . Left sided numbness 10/28/2011  . Left-sided weakness 10/28/2011  . Thrombocytopenia ([place]) 10/28/2011  . Acute thrombotic stroke ([place]) 10/28/2011  . Hypertension 10/28/2011  . Hyperlipidemia 10/28/2011   Guadelupe Sabin, OTR/L  [phone] 05/25/2018, 10:30  [place] [address] [place], [place], [address] Phone: [phone]   Fax:  [phone]  Name: Kristina Mcdonald MRN: [id number] Date of Birth: 11/27/1955

## 2018-05-31 ENCOUNTER — Ambulatory Visit (HOSPITAL_COMMUNITY): Payer: BLUE CROSS/BLUE SHIELD | Attending: Orthopaedic Surgery

## 2018-05-31 ENCOUNTER — Encounter (HOSPITAL_COMMUNITY): Payer: Self-pay

## 2018-05-31 DIAGNOSIS — M25611 Stiffness of right shoulder, not elsewhere classified: Secondary | ICD-10-CM | POA: Insufficient documentation

## 2018-05-31 DIAGNOSIS — R29898 Other symptoms and signs involving the musculoskeletal system: Secondary | ICD-10-CM | POA: Insufficient documentation

## 2018-05-31 DIAGNOSIS — M25511 Pain in right shoulder: Secondary | ICD-10-CM | POA: Insufficient documentation

## 2018-05-31 NOTE — Therapy (Signed)
[place], [place], [address] Phone: [phone]   Fax:  [phone]  Occupational Therapy Treatment  Patient Details  Name: Kristina Mcdonald MRN: [id number] Date of Birth: 1956-06-04 Referring Provider (OT): Ophelia Charter, MD   Encounter Date: 05/31/2018  OT End of Session - 05/31/18 1031    Visit Number  8    Number of Visits  16    Date for OT Re-Evaluation  06/21/18    Authorization Type  1) [employer]- $25 copay    Authorization - Visit Number  8    Authorization - Number of Visits  30    OT Start Time  [phone]    OT Stop Time  1028    OT Time Calculation (min)  39 min    Activity Tolerance  Patient tolerated treatment well    Behavior During Therapy  [facility] for tasks assessed/performed       Past Medical History:  Diagnosis Date  . BV (bacterial vaginosis) 11/22/2013  . CAD (coronary artery disease) 02/2001   Nonobstructive per cath.  . Chronic anxiety   . Hyperlipidemia   . Hypertension   . Hypothyroidism 10/2006   Status post thyroidectomy for thyroid mass.  . Insomnia 05/10/2013  . Internal hemorrhoids 09/2003  . LLQ pain 10/09/2014  . Overactive bladder   . Seasonal allergies   . Stress 05/10/2013  . Stroke ([place])   . Thrombocytopenia (HCC)    Chronic  . Thrush, oral 11/22/2013  . Tubular adenoma   . Vaginal discharge 11/22/2013  . Vaginal odor 10/09/2014    Past Surgical History:  Procedure Laterality Date  . ABDOMINAL HYSTERECTOMY    . APPENDECTOMY  11/2002.   Incidental during laparotomy and excision of ovarian remnant.  Marland Kitchen CARDIAC CATHETERIZATION  02/2001   Nonobstructive CAD  . COLONOSCOPY  09/2003   Per Dr. Gala Romney. Internal hemorrhoids.normal otherwise  . COLONOSCOPY N/A 12/04/2013   Dr. Gala Romney- normal rectum aside from minimal internal hemorrhoids. the patient had a 49m polyp in the mid ascending segment and an 872mpoylp in the mid descending segment o/w the remaider of the colonic mucosa appeared  normal. the distal 10cm of terminal mucosa also appeared normal.tubular adenoma on bx  . ESOPHAGOGASTRODUODENOSCOPY N/A 12/04/2013   Dr.Rourk- distal esophageal erosions w/i 96m30mf the GE junction. no basrrett's esophagus. tubular esophagus patent throughout its course. focal antral erosions o/w normal appearing gastric mucosa. patent pylorus, normal first and second portion of the duodenum. mild chronic inflammation on bx  . INNER EAR SURGERY     ear drum  . LAPAROTOMY  11/2002   Excision of ovarian remnant  . THYROIDECTOMY  10/2006   Neg for cancer per patient  . TOTAL ABDOMINAL HYSTERECTOMY W/ BILATERAL SALPINGOOPHORECTOMY  09/2001.    There were no vitals filed for this visit.  Subjective Assessment - 05/31/18 1018    Subjective   S: I have pain under my arm when I'm reaching overhead.     Currently in Pain?  No/denies         [facility] - 05/31/18 1018      Assessment   Medical Diagnosis  Right shoulder tendonitis       Precautions   Precautions  None               OT Treatments/Exercises (OP) - 05/31/18 1018      Exercises   Exercises  Shoulder      Shoulder Exercises:  Therapy Ball   Other Therapy Ball Exercises  Green therapy ball: flexion; circle (right/left), chest press; 10X      Shoulder Exercises: ROM/Strengthening   UBE (Upper Arm Bike)  Level 2 2' forward 2' reverse   pace: 4.5-5.5   X to V Arms  12X, 1#    Proximal Shoulder Strengthening, Seated  12X with 1# one rest break      Manual Therapy   Manual Therapy  Joint mobilization;Muscle Energy Technique    Manual therapy comments  Completed separately from therapeutic exercise     Joint Mobilization  Joint mobilization technique completed to right shoulder  to increase ROM and decrease tone.     Muscle Energy Technique  Muscle energy technique completed to right shoulder RTC to increase external rotation and decrease muscle tone to allow for an increase in ROM.                OT  Short Term Goals - 05/31/18 1051      OT SHORT TERM GOAL #1   Title  Pt will be provided with and educated on HEP for improved mobility required for ADL completion.     Time  4    Period  Weeks    Status  On-going      OT SHORT TERM GOAL #2   Title  Pt will decrease pain in RUE to 4/10 or less to improve ability to reach behind back toilet hygiene tasks.      Time  4    Period  Weeks      OT SHORT TERM GOAL #3   Title  Pt will increase RUE P/ROM to WNL to improve ability to reach overhead during bathing tasks.      Time  4    Period  Weeks    Status  Partially Met      OT SHORT TERM GOAL #4   Title  Pt will decrease fascial restrictions in RUE to minimal amounts or less to improve mobility required for functional reaching tasks.      Time  4    Period  Weeks      OT SHORT TERM GOAL #5   Title  Pt will improve RUE strength to 4/5 to improve ability to care for grandchildren     Time  4    Period  Weeks    Status  Partially Met        OT Long Term Goals - 05/31/18 1051      OT LONG TERM GOAL #1   Title  Patient will return to highest level of independence while using her RUE for all daily tasks and being able to complete ADLs independently and without assistance from husband.      Time  8    Period  Weeks    Status  On-going      OT LONG TERM GOAL #2   Title  Pt will improve RUE strength to at least 4+/5 to improve ability to perform housekeeping tasks independently.      Time  8    Period  Weeks    Status  On-going      OT LONG TERM GOAL #3   Title  Pt will increase RUE A/ROM to [facility] to improve ability to reach behind back bathing tasks.     Time  8    Period  Weeks      OT LONG TERM GOAL #4   Title  Patient will decrease  pain level in RUE to 2/10 or less when completing grooming tasks.     Time  8    Period  Weeks    Status  On-going            Plan - 05/31/18 1031    Clinical Impression Statement  A: Completed muscle energy technique and joint  mobilization techniques to increase external rotation with joint distraction. patient did achieve slightly more range although reports pain with entire movement. Verbal education provided to complete star gazer stretch at home when going to bed at night. Hold for 30 seconds and complete 2 sets. Pt verbalized understanding.     Plan  P: Continue to work on increasing external rotation during passive ROM with use of muscle energy technique and joint mobilization techniques. Resume red theraband loop. Complete horizontal abduction and flexion with either theraband or bodyweight  system    Consulted and Agree with Plan of Care  Patient       Patient will benefit from skilled therapeutic intervention in order to improve the following deficits and impairments:  Decreased range of motion, Decreased activity tolerance, Increased fascial restrictions, Impaired UE functional use, Pain, Decreased strength  Visit Diagnosis: Acute pain of right shoulder  Stiffness of right shoulder, not elsewhere classified  Other symptoms and signs involving the musculoskeletal system    Problem List Patient Active Problem List   Diagnosis Date Noted  . Vaginal odor 10/09/2014  . LLQ pain 10/09/2014  . Diarrhea 12/14/2013  . Loose stools 11/29/2013  . Nausea with vomiting 11/29/2013  . Vaginal discharge 11/22/2013  . BV (bacterial vaginosis) 11/22/2013  . Thrush, oral 11/22/2013  . Insomnia 05/10/2013  . Stress 05/10/2013  . Left sided numbness 10/28/2011  . Left-sided weakness 10/28/2011  . Thrombocytopenia ([place]) 10/28/2011  . Acute thrombotic stroke ([place]) 10/28/2011  . Hypertension 10/28/2011  . Hyperlipidemia 10/28/2011   Ailene Ravel, OTR/L,CBIS  [phone]  05/31/2018, 10:53 AM  [place] [address] [place], [place], [address] Phone: [phone]   Fax:  [phone]  Name: Kristina Mcdonald MRN: [id number] Date of Birth: 1956-05-20

## 2018-06-02 ENCOUNTER — Telehealth (HOSPITAL_COMMUNITY): Payer: Self-pay | Admitting: Occupational Therapy

## 2018-06-02 ENCOUNTER — Ambulatory Visit (HOSPITAL_COMMUNITY): Payer: BLUE CROSS/BLUE SHIELD | Admitting: Occupational Therapy

## 2018-06-02 NOTE — Telephone Encounter (Signed)
Patient cancel for today she is not feeling well.

## 2018-06-06 ENCOUNTER — Telehealth (HOSPITAL_COMMUNITY): Payer: Self-pay | Admitting: Family Medicine

## 2018-06-06 NOTE — Telephone Encounter (Signed)
06/06/18  pt left a message that she went back to her dr and he said that home therapy would be ok for her

## 2018-06-07 ENCOUNTER — Encounter (HOSPITAL_COMMUNITY): Payer: Self-pay | Admitting: Occupational Therapy

## 2018-06-07 ENCOUNTER — Ambulatory Visit (HOSPITAL_COMMUNITY): Payer: BLUE CROSS/BLUE SHIELD | Admitting: Occupational Therapy

## 2018-06-07 NOTE — Therapy (Signed)
[place], [place], [address] Phone: [phone]   Fax:  [phone]  Patient Details  Name: Kristina Mcdonald MRN: [id number] Date of Birth: 1956/02/16 Referring Provider:  No ref. provider found  Encounter Date: 06/07/2018  OCCUPATIONAL THERAPY DISCHARGE SUMMARY  Visits from Start of Care: 8  Current functional level related to goals / functional outcomes: Pt is pleased with current functional level and called to request discharge as MD released her to complete home exercises. Pt has ROM and strength WFL, is not experiencing pain with basic ADL completion.    Remaining deficits: Strength and activity tolerance, occasional pain/discomfort   Education / Equipment: HEP for ROM and strengthening  Plan: Patient agrees to discharge.  Patient goals were partially met. Patient is being discharged due to being pleased with the current functional level.  ?????     OT Short Term Goals - 05/24/18 1202            OT SHORT TERM GOAL #1   Title  Pt will be provided with and educated on HEP for improved mobility required for ADL completion.     Time  4    Period  Weeks    Status  On-going        OT SHORT TERM GOAL #2   Title  Pt will decrease pain in RUE to 4/10 or less to improve ability to reach behind back toilet hygiene tasks.      Time  4    Period  Weeks    Status  Achieved        OT SHORT TERM GOAL #3   Title  Pt will increase RUE P/ROM to WNL to improve ability to reach overhead during bathing tasks.      Time  4    Period  Weeks    Status  Partially Met        OT SHORT TERM GOAL #4   Title  Pt will decrease fascial restrictions in RUE to minimal amounts or less to improve mobility required for functional reaching tasks.      Time  4    Period  Weeks    Status  Achieved        OT SHORT TERM GOAL #5   Title  Pt will improve RUE strength to 4/5 to improve ability to care for  grandchildren     Time  4    Period  Weeks    Status  Partially Met       OT Long Term Goals - 05/24/18 1202            OT LONG TERM GOAL #1   Title  Patient will return to highest level of independence while using her RUE for all daily tasks and being able to complete ADLs independently and without assistance from husband.      Time  8    Period  Weeks    Status  On-going        OT LONG TERM GOAL #2   Title  Pt will improve RUE strength to at least 4+/5 to improve ability to perform housekeeping tasks independently.      Time  8    Period  Weeks    Status  On-going        OT LONG TERM GOAL #3   Title  Pt will increase RUE A/ROM to [facility] to improve ability to reach behind back bathing tasks.  Time  8    Period  Weeks    Status  Achieved        OT LONG TERM GOAL #4   Title  Patient will decrease pain level in RUE to 2/10 or less when completing grooming tasks.     Time  8    Period  Weeks    Status  On-going      Guadelupe Sabin, OTR/L  [phone] 06/07/2018, 8:47 AM  [place] [address] [place], [place], [address] Phone: [phone]   Fax:  [phone]

## 2018-06-09 ENCOUNTER — Encounter (HOSPITAL_COMMUNITY): Payer: BLUE CROSS/BLUE SHIELD | Admitting: Occupational Therapy

## 2018-06-14 ENCOUNTER — Encounter (HOSPITAL_COMMUNITY): Payer: BLUE CROSS/BLUE SHIELD | Admitting: Occupational Therapy

## 2018-06-16 ENCOUNTER — Encounter (HOSPITAL_COMMUNITY): Payer: BLUE CROSS/BLUE SHIELD | Admitting: Occupational Therapy

## 2018-06-21 ENCOUNTER — Ambulatory Visit (HOSPITAL_COMMUNITY): Payer: BLUE CROSS/BLUE SHIELD | Admitting: Occupational Therapy

## 2018-06-23 ENCOUNTER — Encounter (HOSPITAL_COMMUNITY): Payer: BLUE CROSS/BLUE SHIELD | Admitting: Occupational Therapy

## 2018-12-06 ENCOUNTER — Encounter: Payer: Self-pay | Admitting: Internal Medicine

## 2018-12-22 ENCOUNTER — Other Ambulatory Visit: Payer: Self-pay

## 2018-12-22 ENCOUNTER — Other Ambulatory Visit: Payer: BLUE CROSS/BLUE SHIELD

## 2018-12-22 DIAGNOSIS — Z20822 Contact with and (suspected) exposure to covid-19: Secondary | ICD-10-CM

## 2018-12-28 LAB — NOVEL CORONAVIRUS, NAA: SARS-CoV-2, NAA: NOT DETECTED

## 2019-05-01 ENCOUNTER — Other Ambulatory Visit (HOSPITAL_COMMUNITY): Payer: Self-pay | Admitting: Internal Medicine

## 2019-05-01 DIAGNOSIS — Z1231 Encounter for screening mammogram for malignant neoplasm of breast: Secondary | ICD-10-CM

## 2019-05-18 ENCOUNTER — Encounter (HOSPITAL_COMMUNITY): Payer: Self-pay

## 2019-05-18 ENCOUNTER — Other Ambulatory Visit: Payer: Self-pay

## 2019-05-18 ENCOUNTER — Ambulatory Visit (HOSPITAL_COMMUNITY)
Admission: RE | Admit: 2019-05-18 | Discharge: 2019-05-18 | Disposition: A | Discharge status: Home / Self Care | Payer: BC Managed Care – PPO | Source: Ambulatory Visit | Attending: Internal Medicine | Admitting: Internal Medicine

## 2019-05-18 DIAGNOSIS — Z1231 Encounter for screening mammogram for malignant neoplasm of breast: Secondary | ICD-10-CM | POA: Insufficient documentation

## 2020-08-22 DIAGNOSIS — F633 Trichotillomania: Secondary | ICD-10-CM | POA: Diagnosis not present

## 2020-08-22 DIAGNOSIS — Z1331 Encounter for screening for depression: Secondary | ICD-10-CM | POA: Diagnosis not present

## 2020-08-22 DIAGNOSIS — Z6839 Body mass index (BMI) 39.0-39.9, adult: Secondary | ICD-10-CM | POA: Diagnosis not present

## 2020-08-22 DIAGNOSIS — E7849 Other hyperlipidemia: Secondary | ICD-10-CM | POA: Diagnosis not present

## 2020-08-22 DIAGNOSIS — Z1389 Encounter for screening for other disorder: Secondary | ICD-10-CM | POA: Diagnosis not present

## 2020-08-22 DIAGNOSIS — Z0001 Encounter for general adult medical examination with abnormal findings: Secondary | ICD-10-CM | POA: Diagnosis not present

## 2020-08-22 DIAGNOSIS — I10 Essential (primary) hypertension: Secondary | ICD-10-CM | POA: Diagnosis not present

## 2020-09-19 ENCOUNTER — Other Ambulatory Visit (HOSPITAL_COMMUNITY): Payer: Self-pay | Admitting: Physician Assistant

## 2020-09-19 ENCOUNTER — Ambulatory Visit (HOSPITAL_COMMUNITY)
Admission: RE | Admit: 2020-09-19 | Discharge: 2020-09-19 | Disposition: A | Discharge status: Home / Self Care | Payer: PPO | Source: Ambulatory Visit | Attending: Physician Assistant | Admitting: Physician Assistant

## 2020-09-19 DIAGNOSIS — M2242 Chondromalacia patellae, left knee: Secondary | ICD-10-CM | POA: Diagnosis not present

## 2020-09-19 DIAGNOSIS — M7989 Other specified soft tissue disorders: Secondary | ICD-10-CM | POA: Diagnosis not present

## 2020-09-19 DIAGNOSIS — M25462 Effusion, left knee: Secondary | ICD-10-CM | POA: Diagnosis not present

## 2020-09-19 DIAGNOSIS — Z6839 Body mass index (BMI) 39.0-39.9, adult: Secondary | ICD-10-CM | POA: Diagnosis not present

## 2020-09-19 DIAGNOSIS — M1712 Unilateral primary osteoarthritis, left knee: Secondary | ICD-10-CM | POA: Diagnosis not present

## 2020-09-30 DIAGNOSIS — M13862 Other specified arthritis, left knee: Secondary | ICD-10-CM | POA: Diagnosis not present

## 2020-09-30 DIAGNOSIS — M25462 Effusion, left knee: Secondary | ICD-10-CM | POA: Diagnosis not present

## 2021-03-04 DIAGNOSIS — Z6839 Body mass index (BMI) 39.0-39.9, adult: Secondary | ICD-10-CM | POA: Diagnosis not present

## 2021-03-04 DIAGNOSIS — N342 Other urethritis: Secondary | ICD-10-CM | POA: Diagnosis not present

## 2021-03-24 ENCOUNTER — Other Ambulatory Visit: Payer: Self-pay

## 2021-03-24 ENCOUNTER — Emergency Department (HOSPITAL_COMMUNITY)
Admission: EM | Admit: 2021-03-24 | Discharge: 2021-03-24 | Discharge status: Against Medical Advice | Payer: PPO | Attending: Emergency Medicine | Admitting: Emergency Medicine

## 2021-03-24 ENCOUNTER — Emergency Department (HOSPITAL_COMMUNITY): Payer: PPO

## 2021-03-24 ENCOUNTER — Encounter (HOSPITAL_COMMUNITY): Payer: Self-pay | Admitting: *Deleted

## 2021-03-24 DIAGNOSIS — R531 Weakness: Secondary | ICD-10-CM | POA: Diagnosis not present

## 2021-03-24 DIAGNOSIS — R251 Tremor, unspecified: Secondary | ICD-10-CM | POA: Insufficient documentation

## 2021-03-24 DIAGNOSIS — Z79899 Other long term (current) drug therapy: Secondary | ICD-10-CM | POA: Diagnosis not present

## 2021-03-24 DIAGNOSIS — I2510 Atherosclerotic heart disease of native coronary artery without angina pectoris: Secondary | ICD-10-CM | POA: Diagnosis not present

## 2021-03-24 DIAGNOSIS — I10 Essential (primary) hypertension: Secondary | ICD-10-CM | POA: Diagnosis not present

## 2021-03-24 DIAGNOSIS — Z7982 Long term (current) use of aspirin: Secondary | ICD-10-CM | POA: Diagnosis not present

## 2021-03-24 DIAGNOSIS — E039 Hypothyroidism, unspecified: Secondary | ICD-10-CM | POA: Insufficient documentation

## 2021-03-24 DIAGNOSIS — R079 Chest pain, unspecified: Secondary | ICD-10-CM | POA: Diagnosis not present

## 2021-03-24 LAB — TROPONIN I (HIGH SENSITIVITY)
Troponin I (High Sensitivity): 3 ng/L (ref <18)
Troponin I (High Sensitivity): 2 ng/L (ref <18)

## 2021-03-24 LAB — URINALYSIS, ROUTINE W REFLEX MICROSCOPIC
Bilirubin Urine: NEGATIVE
Glucose, UA: NEGATIVE mg/dL
Hgb urine dipstick: NEGATIVE
Ketones, ur: 5 mg/dL — AB
Nitrite: NEGATIVE
Protein, ur: NEGATIVE mg/dL
Specific Gravity, Urine: 1.006 (ref 1.005–1.030)
pH: 6 (ref 5.0–8.0)

## 2021-03-24 LAB — CBC
HCT: 44.8 % (ref 36.0–46.0)
Hemoglobin: 15.3 g/dL — ABNORMAL HIGH (ref 12.0–15.0)
MCH: 30.6 pg (ref 26.0–34.0)
MCHC: 34.2 g/dL (ref 30.0–36.0)
MCV: 89.6 fL (ref 80.0–100.0)
Platelets: 52 10*3/uL — ABNORMAL LOW (ref 150–400)
RBC: 5 MIL/uL (ref 3.87–5.11)
RDW: 12.5 % (ref 11.5–15.5)
WBC: 6.7 10*3/uL (ref 4.0–10.5)
nRBC: 0 % (ref 0.0–0.2)

## 2021-03-24 LAB — BASIC METABOLIC PANEL
Anion gap: 10 (ref 5–15)
BUN: 14 mg/dL (ref 8–23)
CO2: 21 mmol/L — ABNORMAL LOW (ref 22–32)
Calcium: 9.5 mg/dL (ref 8.9–10.3)
Chloride: 105 mmol/L (ref 98–111)
Creatinine, Ser: 0.78 mg/dL (ref 0.44–1.00)
GFR, Estimated: >60 mL/min (ref >60)
Glucose, Bld: 120 mg/dL — ABNORMAL HIGH (ref 70–99)
Potassium: 3.4 mmol/L — ABNORMAL LOW (ref 3.5–5.1)
Sodium: 136 mmol/L (ref 135–145)

## 2021-03-24 NOTE — ED Provider Notes (Signed)
[facility] Provider Note   CSN: [id number] Arrival date & time: 03/24/21  1555     History No chief complaint on file.   Kristina Mcdonald is a 65 y.o. female.  HPI     Kristina Mcdonald is a 65 y.o. female with history of thrombotic stroke, HTN and anxiety who presents to the Emergency Department requesting evaluation of elevated blood pressure and generalized shaking.  She states that she was involved in an argument with spouse few hours prior to arrival.  Shortly after, she began having shaking all over and felt her heart was beating rapidly.  She took one of her 1 mg Xanax tablets and symptoms have improved.  Spouse at bedside states that her shaking appeared to be increased on her right side and that she was having difficulty with walking and appeared sluggish.  She denies history of seizures, syncope, chest pain, shortness of breath, dizziness, facial or extremity weakness   Past Medical History:  Diagnosis Date   BV (bacterial vaginosis) 11/22/2013   CAD (coronary artery disease) 02/2001   Nonobstructive per cath.   Chronic anxiety    Hyperlipidemia    Hypertension    Hypothyroidism 10/2006   Status post thyroidectomy for thyroid mass.   Insomnia 05/10/2013   Internal hemorrhoids 09/2003   LLQ pain 10/09/2014   Overactive bladder    Seasonal allergies    Stress 05/10/2013   Stroke ([place])    Thrombocytopenia ([place])    Chronic   Thrush, oral 11/22/2013   Tubular adenoma    Vaginal discharge 11/22/2013   Vaginal odor 10/09/2014    Patient Active Problem List   Diagnosis Date Noted   Vaginal odor 10/09/2014   LLQ pain 10/09/2014   Diarrhea 12/14/2013   Loose stools 11/29/2013   Nausea with vomiting 11/29/2013   Vaginal discharge 11/22/2013   BV (bacterial vaginosis) 11/22/2013   Thrush, oral 11/22/2013   Insomnia 05/10/2013   Stress 05/10/2013   Left sided numbness 10/28/2011   Left-sided weakness 10/28/2011   Thrombocytopenia ([place]) 10/28/2011    Acute thrombotic stroke ([place]) 10/28/2011   Hypertension 10/28/2011   Hyperlipidemia 10/28/2011    Past Surgical History:  Procedure Laterality Date   ABDOMINAL HYSTERECTOMY     APPENDECTOMY  11/2002.   Incidental during laparotomy and excision of ovarian remnant.   CARDIAC CATHETERIZATION  02/2001   Nonobstructive CAD   COLONOSCOPY  09/2003   Per Dr. Gala Romney. Internal hemorrhoids.normal otherwise   COLONOSCOPY N/A 12/04/2013   Dr. Gala Romney- normal rectum aside from minimal internal hemorrhoids. the patient had a 26mm polyp in the mid ascending segment and an 37mm poylp in the mid descending segment o/w the remaider of the colonic mucosa appeared normal. the distal 10cm of terminal mucosa also appeared normal.tubular adenoma on bx   ESOPHAGOGASTRODUODENOSCOPY N/A 12/04/2013   Dr.Rourk- distal esophageal erosions w/i 52mm of the GE junction. no basrrett's esophagus. tubular esophagus patent throughout its course. focal antral erosions o/w normal appearing gastric mucosa. patent pylorus, normal first and second portion of the duodenum. mild chronic inflammation on bx   INNER EAR SURGERY     ear drum   LAPAROTOMY  11/2002   Excision of ovarian remnant   THYROIDECTOMY  10/2006   Neg for cancer per patient   TOTAL ABDOMINAL HYSTERECTOMY W/ BILATERAL SALPINGOOPHORECTOMY  09/2001.     OB History     Gravida  2   Para  2   Term  Preterm      AB      Living  2      SAB      IAB      Ectopic      Multiple      Live Births  2           Family History  Problem Relation Age of Onset   Heart disease Mother        CHF   Cancer Father        prostate   Stroke Brother    Stroke Sister    Heart disease Sister        CHF   Multiple sclerosis Sister    Stroke Daughter        light strokes   Heart attack Sister    Colon cancer Neg Hx     Social History   Tobacco Use   Smoking status: Never   Smokeless tobacco: Never   Tobacco comments:    Never smoker  Substance Use  Topics   Alcohol use: No   Drug use: No    Home Medications Prior to Admission medications   Medication Sig Start Date End Date Taking? Authorizing Provider  ALPRAZolam (XANAX) 1 MG tablet TAKE 1 TABLET BY MOUTH 3 TIMES A DAY AS NEEDED Patient taking differently: TAKE 1 mg  BY MOUTH daily at bedtime as needed for sleep    Derrek Monaco A, NP  aspirin EC 81 MG tablet Take 81 mg by mouth daily.    [provider]  atorvastatin (LIPITOR) 40 MG tablet Take 40 mg by mouth daily.    [provider]  cetirizine (ZYRTEC) 10 MG tablet Take 10 mg by mouth at bedtime.     [provider]  dicyclomine (BENTYL) 20 MG tablet Take one every 8 hours for abd cramps Patient taking differently: Take 20 mg by mouth daily. Take one every 8 hours for abd cramps 07/24/15   Milton Ferguson, MD  esomeprazole (NEXIUM) 40 MG capsule Take 1 capsule (40 mg total) by mouth daily before breakfast. Patient not taking: Reported on 03/24/2017 12/12/13   Annitta Needs, NP  furosemide (LASIX) 20 MG tablet Take 20 mg by mouth daily.    [provider]  HYDROcodone-acetaminophen (NORCO/VICODIN) 5-325 MG tablet Take 1 tablet by mouth every 6 (six) hours as needed. Patient not taking: Reported on 03/24/2017 07/24/15   Milton Ferguson, MD  ibuprofen (ADVIL,MOTRIN) 200 MG tablet Take 400 mg by mouth every 6 (six) hours as needed for mild pain.    [provider]  levothyroxine (SYNTHROID, LEVOTHROID) 50 MCG tablet Take 50 mcg by mouth daily before breakfast.    [provider]  losartan-hydrochlorothiazide (HYZAAR) 50-12.5 MG per tablet Take 1 tablet by mouth every morning.     [provider]  metroNIDAZOLE (METROGEL VAGINAL) 0.75 % vaginal gel Place 1 Applicatorful vaginally at bedtime. Patient not taking: Reported on 07/24/2015 10/09/14   Derrek Monaco A, NP  mirtazapine (REMERON) 15 MG tablet Take 15 mg by mouth at bedtime.  03/09/17   [provider]   Multiple Vitamin (MULITIVITAMIN WITH MINERALS) TABS Take 1 tablet by mouth every morning.     [provider]  nystatin-triamcinolone ointment (MYCOLOG) APPLY TO AFFECTED AREA TWICE A DAY Patient not taking: Reported on 03/24/2017 07/29/15   Estill Dooms, NP  oxybutynin (DITROPAN-XL) 10 MG 24 hr tablet Take 10 mg by mouth every morning.    [provider]  pantoprazole (PROTONIX) 40 MG tablet Take 1 tablet (40 mg total) by mouth daily. 12/13/13   Annitta Needs, NP  vitamin B-12 (CYANOCOBALAMIN) 1000 MCG tablet Take 1,000 mcg by mouth every morning.     [provider]  vitamin E 400 UNIT capsule Take 800 Units by mouth daily.     [provider]    Allergies    Patient has no known allergies.  Review of Systems   Review of Systems  Constitutional:  Negative for chills and fever.  Eyes:  Negative for visual disturbance.  Respiratory:  Negative for shortness of breath.   Cardiovascular:  Positive for palpitations. Negative for chest pain.  Gastrointestinal:  Negative for abdominal pain, nausea and vomiting.  Genitourinary:  Negative for dysuria and flank pain.  Musculoskeletal:  Negative for arthralgias, back pain, myalgias, neck pain and neck stiffness.  Skin:  Negative for rash.  Neurological:  Negative for dizziness, syncope, speech difficulty, weakness and numbness.  Hematological:  Does not bruise/bleed easily.  Psychiatric/Behavioral:  Negative for confusion and decreased concentration. The patient is nervous/anxious.    Physical Exam Updated Vital Signs BP (!) 171/78   Pulse (!) 101   Temp 98.7 F (37.1 C)   Resp 18   SpO2 98%   Physical Exam Vitals and nursing note reviewed.  Constitutional:      General: She is not in acute distress.    Appearance: Normal appearance.  HENT:     Head: Normocephalic and atraumatic.     Mouth/Throat:     Mouth: Mucous membranes are moist.  Eyes:     Extraocular Movements: Extraocular movements  intact.     Conjunctiva/sclera: Conjunctivae normal.     Pupils: Pupils are equal, round, and reactive to light.  Neck:     Thyroid: No thyromegaly.     Trachea: Phonation normal.     Meningeal: Kernig's sign absent.  Cardiovascular:     Rate and Rhythm: Normal rate and regular rhythm.     Pulses: Normal pulses.  Pulmonary:     Effort: Pulmonary effort is normal.     Breath sounds: Normal breath sounds. No wheezing.  Abdominal:     Palpations: Abdomen is soft.     Tenderness: There is no abdominal tenderness.  Musculoskeletal:        General: Normal range of motion.     Cervical back: Normal range of motion.     Right lower leg: No edema.     Left lower leg: No edema.  Skin:    General: Skin is warm.     Capillary Refill: Capillary refill takes less than 2 seconds.     Findings: No rash.  Neurological:     General: No focal deficit present.     Mental Status: She is alert and oriented to person, place, and time.     GCS: GCS eye subscore is 4. GCS verbal subscore is 5. GCS motor subscore is 6.     Sensory: Sensation is intact. No sensory deficit.     Motor: Motor function is intact. No weakness.     Coordination: Coordination is intact.     Gait: Gait is intact.     Comments: CN II-XII intact.  Speech clear.  No facial droop or pronator drift.  Pt able to perform SLR and hold for 5 seconds bilaterally.    Psychiatric:        Mood and Affect: Mood normal.  Behavior: Behavior normal.    ED Results / Procedures / Treatments   Labs (all labs ordered are listed, but only abnormal results are displayed) Labs Reviewed  BASIC METABOLIC PANEL - Abnormal; Notable for the following components:      Result Value   Potassium 3.4 (*)    CO2 21 (*)    Glucose, Bld 120 (*)    All other components within normal limits  CBC - Abnormal; Notable for the following components:   Hemoglobin 15.3 (*)    Platelets 52 (*)    All other components within normal limits  URINALYSIS,  ROUTINE W REFLEX MICROSCOPIC - Abnormal; Notable for the following components:   Ketones, ur 5 (*)    Leukocytes,Ua TRACE (*)    Bacteria, UA FEW (*)    All other components within normal limits  TROPONIN I (HIGH SENSITIVITY)  TROPONIN I (HIGH SENSITIVITY)    EKG None  Radiology DG Chest 2 View  Result Date: 03/24/2021 CLINICAL DATA:  Chest pain, weakness, hypertension EXAM: CHEST - 2 VIEW COMPARISON:  Chest x-ray 07/24/2015 FINDINGS: The heart and mediastinal contours are unchanged. Aortic calcification No focal consolidation. No pulmonary edema. No pleural effusion. No pneumothorax. No acute osseous abnormality. IMPRESSION: No active cardiopulmonary disease. Electronically Signed   By: Iven Finn M.D.   On: 03/24/2021 17:17    Procedures Procedures   Medications Ordered in ED Medications - No data to display  ED Course  I have reviewed the triage vital signs and the nursing notes.  Pertinent labs & imaging results that were available during my care of the patient were reviewed by me and considered in my medical decision making (see chart for details).    MDM Rules/Calculators/A&P                           Pt here for evaluation of elevated blood pressure and generalized shaking.  Sx's began after an argument with significant other.  Tachcardia on arrival, improved on recheck.  No chest pain or dyspnea.  Spouse reported generalized shaking, worse on right and that she appeared sluggish.  On my exam, pt is alert, well appearing.  Slightly anxious appearing.  No focal neuro deficits.  She ambulated to the restroom and her gait was steady.  Labs w/o significant findings.  Delta trop reassuring and she denies chest pain.  She does not appear post ictal and I do not appreciate any unilateral weakness.  CXR w/o acute findings.  Pt had MR brain in 2018 w/o acute findings.  I have recommended CT head for further evaluation.  Patient refusing CT head.  States that she is feeling  better and requesting discharge home. I have explained importance of CT as her sx's could be related to CVA/ TIA and or seizure and that my evaluation cannot be thoroughly completed w/o it.    She verbalized understanding but continues to refuse and wants to go home.  I have explained that she will need to sign out AMA and she is agreeable.  She understands that she may return if she changes her mind or symptoms worsen.   Final Clinical Impression(s) / ED Diagnoses Final diagnoses:  Hypertension, unspecified type  Episode of shaking    Rx / DC Orders ED Discharge Orders     None        Bufford Lope 03/25/21 2238    Milton Ferguson, MD 03/26/21 2130

## 2021-03-24 NOTE — ED Notes (Signed)
Pt states she does not want a CT scan and would like to leave, PA Tammy notified.

## 2021-03-24 NOTE — ED Notes (Signed)
Pt ambulated to bathroom 

## 2021-04-17 ENCOUNTER — Other Ambulatory Visit: Payer: PPO | Admitting: Adult Health

## 2021-04-18 ENCOUNTER — Other Ambulatory Visit: Payer: Self-pay

## 2021-04-18 ENCOUNTER — Ambulatory Visit (INDEPENDENT_AMBULATORY_CARE_PROVIDER_SITE_OTHER): Payer: PPO | Admitting: Adult Health

## 2021-04-18 ENCOUNTER — Encounter: Payer: Self-pay | Admitting: Adult Health

## 2021-04-18 VITALS — BP 152/76 | HR 84 | Ht 60.5 in | Wt 196.5 lb

## 2021-04-18 DIAGNOSIS — N3941 Urge incontinence: Secondary | ICD-10-CM | POA: Diagnosis not present

## 2021-04-18 DIAGNOSIS — F32A Depression, unspecified: Secondary | ICD-10-CM | POA: Diagnosis not present

## 2021-04-18 DIAGNOSIS — R350 Frequency of micturition: Secondary | ICD-10-CM | POA: Diagnosis not present

## 2021-04-18 DIAGNOSIS — N8111 Cystocele, midline: Secondary | ICD-10-CM | POA: Diagnosis not present

## 2021-04-18 DIAGNOSIS — F419 Anxiety disorder, unspecified: Secondary | ICD-10-CM | POA: Diagnosis not present

## 2021-04-18 DIAGNOSIS — R61 Generalized hyperhidrosis: Secondary | ICD-10-CM

## 2021-04-18 DIAGNOSIS — R319 Hematuria, unspecified: Secondary | ICD-10-CM

## 2021-04-18 LAB — POCT URINALYSIS DIPSTICK
Glucose, UA: NEGATIVE
Ketones, UA: NEGATIVE
Nitrite, UA: NEGATIVE
Protein, UA: NEGATIVE

## 2021-04-18 MED ORDER — PAROXETINE HCL 10 MG PO TABS: 10.0000 mg | ORAL_TABLET | Freq: Every day | ORAL | 2 refills | Status: DC

## 2021-04-18 MED ORDER — MIRABEGRON ER 50 MG PO TB24: 50.0000 mg | ORAL_TABLET | Freq: Every day | ORAL | 0 refills | Status: DC

## 2021-04-18 NOTE — Progress Notes (Signed)
Patient ID: Kristina Mcdonald, female   DOB: 03-17-1956, 65 y.o.   MRN: [id number] History of Present Illness: Kristina Mcdonald is a 65 year old white female, married, sp hysterectomy, in for pelvic exam thinks bladder has dropped, she is up about 3 x at night to pee and has urge incontinence, urinary frequency and night sweats and stressed at home, husband will not go to doctor and she knows he has some dementia, and gets angry. She was seen in ER for elevated BP few weeks ago. She had labs and physical with PCP. PCP is Dr Hilma Favors.  Current Medications, Allergies, Past Medical History, Past Surgical History, Family History and Social History were reviewed in [employer] record.     Review of Systems: Patient denies any daily headaches, hearing loss, fatigue, blurred vision, shortness of breath, chest pain, abdominal pain, problems with bowel movements.No sex in about 18 years. See HPI for positives.    Physical Exam:BP (!) 152/76 (BP Location: Left Arm, Patient Position: Sitting, Cuff Size: Normal)   Pulse 84   Ht 5' 0.5" (1.537 m)   Wt 196 lb 8 oz (89.1 kg)   BMI 37.74 kg/m  Urine dipstick 2+ WBC and trace blood. General:  Well developed, well nourished, no acute distress Skin:  Warm and dry Neck:midline trachea, normal thyroid, no carotid bruits heard Lungs; Clear to auscultation bilaterally Breast:  No dominant palpable mass, retraction, or nipple discharge Cardiovascular: Regular rate and rhythm Abdomen is soft and non tender, no HSM noted. Pelvic:  External genitalia is normal in appearance, no lesions.  The vagina is pale with loss of rugae, +cystocele. Urethra has no lesions or masses. The cervix and uterus are absent. No adnexal masses or tenderness noted.Bladder is non tender, no masses felt. Rectal: Deferred Extremities/musculoskeletal:  No swelling or varicosities noted, no clubbing or cyanosis Psych:  No mood changes, alert and cooperative,seems sad AA is 0 Fall  risk is low Depression screen PHQ 2/9 04/18/2021  Decreased Interest 1  Down, Depressed, Hopeless 1  PHQ - 2 Score 2  Altered sleeping 3  Tired, decreased energy 2  Change in appetite 3  Feeling bad or failure about yourself  1  Trouble concentrating 3  Moving slowly or fidgety/restless 1  Suicidal thoughts 0  PHQ-9 Score 15    GAD 7 : Generalized Anxiety Score 04/18/2021  Nervous, Anxious, on Edge 3  Control/stop worrying 3  Worry too much - different things 3  Trouble relaxing 1  Restless 1  Easily annoyed or irritable 3  Afraid - awful might happen 2  Total GAD 7 Score 16      Upstream - 04/18/21 1229       Pregnancy Intention Screening   Does the patient want to become pregnant in the next year? N/A    Does the patient's partner want to become pregnant in the next year? N/A    Would the patient like to discuss contraceptive options today? N/A      Contraception Wrap Up   Current Method Female Sterilization   hyst   End Method Female Sterilization   hyst   Contraception Counseling Provided No             Examination chaperoned by Levy Pupa LPN   Impression and Plan: 1. Urinary frequency Will get UA C&S  - POCT Urinalysis Dipstick - Urine Culture - Urinalysis, Routine w reflex microscopic Stop oxybutynin and try myrbetriq 50 mg #28 tabs given to take 1  daily  2. Cystocele, midline   3. Anxiety and depression Will try paxil 10 mg daily Meds ordered this encounter  Medications   PARoxetine (PAXIL) 10 MG tablet    Sig: Take 1 tablet (10 mg total) by mouth daily.    Dispense:  30 tablet    Refill:  2    Order Specific Question:   Supervising Provider    Answer:   Elonda Husky, LUTHER H [[id number]]   mirabegron ER (MYRBETRIQ) 50 MG TB24 tablet    Sig: Take 1 tablet (50 mg total) by mouth daily.    Dispense:  28 tablet    Refill:  0    Order Specific Question:   Supervising Provider    Answer:   Tania Ade H [[id number]]   Follow up in 4 weeks with me for ROS    4. Night sweats Stop estrace tablets   5. Urge incontinence of urine Will try myrbetriq  - Urine Culture - Urinalysis, Routine w reflex microscopic  6. Hematuria, unspecified type  - Urine Culture - Urinalysis, Routine w reflex microscopic

## 2021-04-19 LAB — URINALYSIS, ROUTINE W REFLEX MICROSCOPIC
Bilirubin, UA: NEGATIVE
Glucose, UA: NEGATIVE
Ketones, UA: NEGATIVE
Nitrite, UA: NEGATIVE
Protein,UA: NEGATIVE
RBC, UA: NEGATIVE
Specific Gravity, UA: 1.013 (ref 1.005–1.030)
Urobilinogen, Ur: 0.2 mg/dL (ref 0.2–1.0)
pH, UA: 7.5 (ref 5.0–7.5)

## 2021-04-19 LAB — MICROSCOPIC EXAMINATION
Bacteria, UA: NONE SEEN
Casts: NONE SEEN /lpf

## 2021-04-23 ENCOUNTER — Telehealth: Payer: Self-pay | Admitting: Adult Health

## 2021-04-23 LAB — URINE CULTURE

## 2021-04-23 MED ORDER — SULFAMETHOXAZOLE-TRIMETHOPRIM 800-160 MG PO TABS: 1.0000 | ORAL_TABLET | Freq: Two times a day (BID) | ORAL | 0 refills | Status: DC

## 2021-04-23 NOTE — Telephone Encounter (Signed)
Pt aware urine + EColi, will rx septra ds

## 2021-04-28 DIAGNOSIS — I129 Hypertensive chronic kidney disease with stage 1 through stage 4 chronic kidney disease, or unspecified chronic kidney disease: Secondary | ICD-10-CM | POA: Diagnosis not present

## 2021-04-28 DIAGNOSIS — E1122 Type 2 diabetes mellitus with diabetic chronic kidney disease: Secondary | ICD-10-CM | POA: Diagnosis not present

## 2021-04-28 DIAGNOSIS — N182 Chronic kidney disease, stage 2 (mild): Secondary | ICD-10-CM | POA: Diagnosis not present

## 2021-04-28 DIAGNOSIS — E782 Mixed hyperlipidemia: Secondary | ICD-10-CM | POA: Diagnosis not present

## 2021-05-16 ENCOUNTER — Ambulatory Visit: Payer: PPO | Admitting: Adult Health

## 2021-05-16 ENCOUNTER — Encounter: Payer: Self-pay | Admitting: Adult Health

## 2021-05-16 ENCOUNTER — Other Ambulatory Visit: Payer: Self-pay

## 2021-05-16 VITALS — BP 172/77 | HR 84 | Ht 60.0 in | Wt 203.0 lb

## 2021-05-16 DIAGNOSIS — R350 Frequency of micturition: Secondary | ICD-10-CM | POA: Diagnosis not present

## 2021-05-16 DIAGNOSIS — Z87440 Personal history of urinary (tract) infections: Secondary | ICD-10-CM

## 2021-05-16 DIAGNOSIS — N3941 Urge incontinence: Secondary | ICD-10-CM | POA: Diagnosis not present

## 2021-05-16 DIAGNOSIS — F32A Depression, unspecified: Secondary | ICD-10-CM | POA: Diagnosis not present

## 2021-05-16 DIAGNOSIS — F419 Anxiety disorder, unspecified: Secondary | ICD-10-CM

## 2021-05-16 LAB — POCT URINALYSIS DIPSTICK
Blood, UA: NEGATIVE
Glucose, UA: NEGATIVE
Ketones, UA: NEGATIVE
Leukocytes, UA: NEGATIVE
Nitrite, UA: NEGATIVE
Protein, UA: NEGATIVE

## 2021-05-16 MED ORDER — MIRABEGRON ER 50 MG PO TB24: 50.0000 mg | ORAL_TABLET | Freq: Every day | ORAL | 6 refills | Status: DC

## 2021-05-16 NOTE — Progress Notes (Signed)
  Subjective:     Patient ID: [name], female   DOB: 10-14-1955, 65 y.o.   MRN: [id number]  HPI Shamyra is a 65 year old white female, married sp hysterectomy back in follow up on starting Paxil and myrbetriq and stopping estrace, and is feeling better. Not up at night to pee but maybe twice, no night sweats and feels more even, not as stressed.And she was treated for UTI.  PCP is Dr Hilma Favors.   Review of Systems Feels better,not as stressed No night sweats Still not sleeping well yet Not up at night to pee as much  Reviewed past medical,surgical, social and family history. Reviewed medications and allergies.     Objective:   Physical Exam BP (!) 172/77 (BP Location: Left Arm, Patient Position: Sitting, Cuff Size: Normal)   Pulse 84   Ht 5' (1.524 m)   Wt 203 lb (92.1 kg)   BMI 39.65 kg/m  urine dipstick is negative  Skin warm and dry. Lungs: clear to ausculation bilaterally. Cardiovascular: regular rate and rhythm.     Fall risk is low Depression screen [facility] 2/9 05/16/2021 04/18/2021  Decreased Interest 0 1  Down, Depressed, Hopeless 0 1  PHQ - 2 Score 0 2  Altered sleeping - 3  Tired, decreased energy - 2  Change in appetite - 3  Feeling bad or failure about yourself  - 1  Trouble concentrating - 3  Moving slowly or fidgety/restless - 1  Suicidal thoughts - 0  PHQ-9 Score - 15     Upstream - 05/16/21 0908       Pregnancy Intention Screening   Does the patient want to become pregnant in the next year? N/A    Does the patient's partner want to become pregnant in the next year? N/A    Would the patient like to discuss contraceptive options today? N/A      Contraception Wrap Up   Current Method No Method - Other Reason   hyst   End Method No Method - Other Reason    Contraception Counseling Provided No             Assessment:        1. History of UTI   2. Anxiety and depression Continue Paxil 10 mg daily has refills   3. Urge incontinence of  urine   4. Urinary frequency Will continue myrbetriq Meds ordered this encounter  Medications   mirabegron ER (MYRBETRIQ) 50 MG TB24 tablet    Sig: Take 1 tablet (50 mg total) by mouth daily.    Dispense:  30 tablet    Refill:  6    Order Specific Question:   Supervising Provider    Answer:   Florian Buff [[id number]]       Plan:     Follow up in 6 weeks or sooner if needed

## 2021-06-27 ENCOUNTER — Ambulatory Visit: Payer: PPO | Admitting: Adult Health

## 2021-07-04 ENCOUNTER — Encounter: Payer: PPO | Admitting: Adult Health

## 2021-07-11 ENCOUNTER — Other Ambulatory Visit: Payer: Self-pay

## 2021-07-11 ENCOUNTER — Ambulatory Visit (INDEPENDENT_AMBULATORY_CARE_PROVIDER_SITE_OTHER): Payer: PPO | Admitting: Adult Health

## 2021-07-11 ENCOUNTER — Encounter: Payer: Self-pay | Admitting: Adult Health

## 2021-07-11 VITALS — BP 157/75 | HR 85 | Ht 60.0 in | Wt 203.0 lb

## 2021-07-11 DIAGNOSIS — N3941 Urge incontinence: Secondary | ICD-10-CM

## 2021-07-11 DIAGNOSIS — F419 Anxiety disorder, unspecified: Secondary | ICD-10-CM | POA: Diagnosis not present

## 2021-07-11 DIAGNOSIS — F32A Depression, unspecified: Secondary | ICD-10-CM | POA: Diagnosis not present

## 2021-07-11 DIAGNOSIS — L409 Psoriasis, unspecified: Secondary | ICD-10-CM | POA: Diagnosis not present

## 2021-07-11 MED ORDER — TRIAMCINOLONE ACETONIDE 0.5 % EX OINT: 1.0000 "application " | TOPICAL_OINTMENT | Freq: Two times a day (BID) | CUTANEOUS | 3 refills | Status: DC

## 2021-07-11 MED ORDER — PAROXETINE HCL 20 MG PO TABS: 20.0000 mg | ORAL_TABLET | Freq: Every day | ORAL | 1 refills | Status: DC

## 2021-07-11 NOTE — Progress Notes (Signed)
Subjective:     Patient ID: Kristina Mcdonald, female   DOB: [birth year], 66 y.o.   MRN: [id number]  HPI Kristina Mcdonald is a 66 year old year old white female,married, sp hysterectomy back in follow up on Paxil and myrbetriq. Mybetriq is working great. But feels more stressed, husband is worse, is peeing outside and normally very private, and threw plate of food across floor, and sleeping fully dressed on top of covers. She is going to call his PCP, and let them know how he is acting.  PCP is Dr Hilma Favors.  Review of Systems UI is much better Feeling more stress, husband worse Has itchy spot behind left ear, tried tar soap and head and shoulders without relief Reviewed past medical,surgical, social and family history. Reviewed medications and allergies.     Objective:   Physical Exam BP (!) 157/75 (BP Location: Left Arm, Patient Position: Sitting, Cuff Size: Large)    Pulse 85    Ht 5' (1.524 m)    Wt 203 lb (92.1 kg)    BMI 39.65 kg/m     Skin warm and dry. Lungs: clear to ausculation bilaterally. Cardiovascular: regular rate and rhythm.  Has patch of dry scaly skin behind left ear in hair line Fall risk is low Depression screen [facility] 2/9 07/11/2021 05/16/2021 04/18/2021  Decreased Interest 1 0 1  Down, Depressed, Hopeless 1 0 1  PHQ - 2 Score 2 0 2  Altered sleeping 3 - 3  Tired, decreased energy 1 - 2  Change in appetite 1 - 3  Feeling bad or failure about yourself  0 - 1  Trouble concentrating 0 - 3  Moving slowly or fidgety/restless 1 - 1  Suicidal thoughts 0 - 0  PHQ-9 Score 8 - 15    GAD 7 : Generalized Anxiety Score 07/11/2021 04/18/2021  Nervous, Anxious, on Edge 1 3  Control/stop worrying 1 3  Worry too much - different things 1 3  Trouble relaxing 1 1  Restless 0 1  Easily annoyed or irritable 1 3  Afraid - awful might happen 1 2  Total GAD 7 Score 6 16    Upstream - 07/11/21 1047       Pregnancy Intention Screening   Does the patient want to become pregnant in the next year?  N/A    Does the patient's partner want to become pregnant in the next year? N/A    Would the patient like to discuss contraceptive options today? N/A      Contraception Wrap Up   Current Method Female Sterilization   hyst   End Method Female Sterilization   hyst   Contraception Counseling Provided No               Assessment:     1. Anxiety and depression Will increase paxil to 20 mg Meds ordered this encounter  Medications   PARoxetine (PAXIL) 20 MG tablet    Sig: Take 1 tablet (20 mg total) by mouth daily.    Dispense:  90 tablet    Refill:  1    Order Specific Question:   Supervising Provider    Answer:   Tania Ade H [[id number]]   triamcinolone ointment (KENALOG) 0.5 %    Sig: Apply 1 application topically 2 (two) times daily.    Dispense:  30 g    Refill:  3    Order Specific Question:   Supervising Provider    Answer:   Tania Ade H [[id number]]  She has xanax from PCP  2. Urge incontinence of urine Continue myrbetriq, has refills   3. Psoriasis Will try kenalog ointment 2-3 x daily     Plan:     Follow up in 8 weeks or sooner if needed

## 2021-07-17 ENCOUNTER — Other Ambulatory Visit: Payer: Self-pay | Admitting: Adult Health

## 2021-09-03 DIAGNOSIS — Z6839 Body mass index (BMI) 39.0-39.9, adult: Secondary | ICD-10-CM | POA: Diagnosis not present

## 2021-09-03 DIAGNOSIS — E669 Obesity, unspecified: Secondary | ICD-10-CM | POA: Diagnosis not present

## 2021-09-03 DIAGNOSIS — F419 Anxiety disorder, unspecified: Secondary | ICD-10-CM | POA: Diagnosis not present

## 2021-09-03 DIAGNOSIS — Z1331 Encounter for screening for depression: Secondary | ICD-10-CM | POA: Diagnosis not present

## 2021-09-03 DIAGNOSIS — I10 Essential (primary) hypertension: Secondary | ICD-10-CM | POA: Diagnosis not present

## 2021-09-03 DIAGNOSIS — Z0001 Encounter for general adult medical examination with abnormal findings: Secondary | ICD-10-CM | POA: Diagnosis not present

## 2021-09-03 DIAGNOSIS — R32 Unspecified urinary incontinence: Secondary | ICD-10-CM | POA: Diagnosis not present

## 2021-09-05 ENCOUNTER — Ambulatory Visit: Payer: PPO | Admitting: Adult Health

## 2021-10-16 ENCOUNTER — Ambulatory Visit (INDEPENDENT_AMBULATORY_CARE_PROVIDER_SITE_OTHER): Payer: PPO

## 2021-10-16 ENCOUNTER — Ambulatory Visit
Admission: EM | Admit: 2021-10-16 | Discharge: 2021-10-16 | Disposition: A | Discharge status: Home / Self Care | Payer: PPO | Attending: Urgent Care | Admitting: Urgent Care

## 2021-10-16 ENCOUNTER — Encounter: Payer: Self-pay | Admitting: Emergency Medicine

## 2021-10-16 DIAGNOSIS — M51369 Other intervertebral disc degeneration, lumbar region without mention of lumbar back pain or lower extremity pain: Secondary | ICD-10-CM

## 2021-10-16 DIAGNOSIS — M47817 Spondylosis without myelopathy or radiculopathy, lumbosacral region: Secondary | ICD-10-CM | POA: Diagnosis not present

## 2021-10-16 DIAGNOSIS — M545 Low back pain, unspecified: Secondary | ICD-10-CM | POA: Diagnosis not present

## 2021-10-16 DIAGNOSIS — Z8739 Personal history of other diseases of the musculoskeletal system and connective tissue: Secondary | ICD-10-CM

## 2021-10-16 DIAGNOSIS — M5136 Other intervertebral disc degeneration, lumbar region: Secondary | ICD-10-CM | POA: Diagnosis not present

## 2021-10-16 MED ORDER — TIZANIDINE HCL 4 MG PO TABS: 4.0000 mg | ORAL_TABLET | Freq: Every day | ORAL | 0 refills | Status: DC

## 2021-10-16 MED ORDER — PREDNISONE 10 MG PO TABS: 30.0000 mg | ORAL_TABLET | Freq: Every day | ORAL | 0 refills | Status: DC

## 2021-10-16 NOTE — ED Triage Notes (Signed)
Was moving blocks on Sunday and felt something "snap" on right side of back.  States she has a bruise in that area.  Has tried icy hot and aleve without relief.  States left leg is swollen ?

## 2021-10-16 NOTE — ED Provider Notes (Signed)
?[place] ? ? ?MRN: [id number] DOB: 11/19/1955 ? ?Subjective:  ? ?Kristina Mcdonald is a 66 y.o. female presenting for several day history of acute onset right-sided low back pain.  Patient states that symptoms started when she was moving blocks and felt a sudden pop, had subsequent bruising over the area.  Has been using topical medication with minimal relief.  No radicular symptoms, trauma, fall, weakness, numbness or tingling, saddle paresthesia, changes to bowel or urinary habits.  Has a remote history of herniated disc of the lumbar region as seen through an MRI in 2003.  Does not have a back specialist. ? ?No current facility-administered medications for this encounter. ? ?Current Outpatient Medications:  ?  ALPRAZolam (XANAX) 0.5 MG tablet, Take 0.5 mg by mouth 4 (four) times daily as needed., Disp: , Rfl:  ?  atorvastatin (LIPITOR) 80 MG tablet, Take 80 mg by mouth daily., Disp: , Rfl:  ?  cetirizine (ZYRTEC) 10 MG tablet, Take 10 mg by mouth at bedtime. , Disp: , Rfl:  ?  ezetimibe (ZETIA) 10 MG tablet, Take 10 mg by mouth daily., Disp: , Rfl:  ?  levothyroxine (SYNTHROID) 100 MCG tablet, Take 100 mcg by mouth daily., Disp: , Rfl:  ?  mirabegron ER (MYRBETRIQ) 50 MG TB24 tablet, Take 1 tablet (50 mg total) by mouth daily., Disp: 30 tablet, Rfl: 6 ?  PARoxetine (PAXIL) 20 MG tablet, Take 1 tablet (20 mg total) by mouth daily., Disp: 90 tablet, Rfl: 1 ?  triamcinolone ointment (KENALOG) 0.5 %, Apply 1 application topically 2 (two) times daily., Disp: 30 g, Rfl: 3  ? ?No Known Allergies ? ?Past Medical History:  ?Diagnosis Date  ? BV (bacterial vaginosis) 11/22/2013  ? CAD (coronary artery disease) 02/2001  ? Nonobstructive per cath.  ? Chronic anxiety   ? Hyperlipidemia   ? Hypertension   ? Hypothyroidism 10/2006  ? Status post thyroidectomy for thyroid mass.  ? Insomnia 05/10/2013  ? Internal hemorrhoids 09/2003  ? LLQ pain 10/09/2014  ? Overactive bladder   ? Seasonal allergies   ? Stress  05/10/2013  ? Stroke [facility])   ? Thrombocytopenia ([place])   ? Chronic  ? Thrush, oral 11/22/2013  ? Tubular adenoma   ? Vaginal discharge 11/22/2013  ? Vaginal odor 10/09/2014  ?  ? ?Past Surgical History:  ?Procedure Laterality Date  ? ABDOMINAL HYSTERECTOMY    ? APPENDECTOMY  11/2002.  ? Incidental during laparotomy and excision of ovarian remnant.  ? CARDIAC CATHETERIZATION  02/2001  ? Nonobstructive CAD  ? COLONOSCOPY  09/2003  ? Per Dr. Gala Romney. Internal hemorrhoids.normal otherwise  ? COLONOSCOPY N/A 12/04/2013  ? Dr. Gala Romney- normal rectum aside from minimal internal hemorrhoids. the patient had a 41m polyp in the mid ascending segment and an 833mpoylp in the mid descending segment o/w the remaider of the colonic mucosa appeared normal. the distal 10cm of terminal mucosa also appeared normal.tubular adenoma on bx  ? ESOPHAGOGASTRODUODENOSCOPY N/A 12/04/2013  ? Dr.Rourk- distal esophageal erosions w/i 19m34mf the GE junction. no basrrett's esophagus. tubular esophagus patent throughout its course. focal antral erosions o/w normal appearing gastric mucosa. patent pylorus, normal first and second portion of the duodenum. mild chronic inflammation on bx  ? INNER EAR SURGERY    ? ear drum  ? LAPAROTOMY  11/2002  ? Excision of ovarian remnant  ? THYROIDECTOMY  10/2006  ? Neg for cancer per patient  ? TOTAL ABDOMINAL HYSTERECTOMY W/ BILATERAL SALPINGOOPHORECTOMY  09/2001.  ? ? ?  Family History  ?Problem Relation Age of Onset  ? Heart disease Mother   ?     CHF  ? Cancer Father   ?     prostate  ? Stroke Brother   ? Stroke Sister   ? Heart disease Sister   ?     CHF  ? Multiple sclerosis Sister   ? Stroke Daughter   ?     light strokes  ? Heart attack Sister   ? Colon cancer Neg Hx   ? ? ?Social History  ? ?Tobacco Use  ? Smoking status: Never  ? Smokeless tobacco: Never  ? Tobacco comments:  ?  Never smoker  ?Vaping Use  ? Vaping Use: Never used  ?Substance Use Topics  ? Alcohol use: No  ? Drug use: No  ? ? ?ROS ? ? ?Objective:   ? ?Vitals: ?BP (!) 181/92 (BP Location: Right Arm)   Pulse (!) 102   Temp 98.9 ?F (37.2 ?C) (Oral)   Resp 18   SpO2 93%  ? ?Physical Exam ?Constitutional:   ?   General: She is not in acute distress. ?   Appearance: Normal appearance. She is well-developed. She is not ill-appearing, toxic-appearing or diaphoretic.  ?HENT:  ?   Head: Normocephalic and atraumatic.  ?   Nose: Nose normal.  ?   Mouth/Throat:  ?   Mouth: Mucous membranes are moist.  ?Eyes:  ?   General: No scleral icterus.    ?   Right eye: No discharge.     ?   Left eye: No discharge.  ?   Extraocular Movements: Extraocular movements intact.  ?Cardiovascular:  ?   Rate and Rhythm: Normal rate.  ?Pulmonary:  ?   Effort: Pulmonary effort is normal.  ?Musculoskeletal:  ?   Lumbar back: Spasms and tenderness (over areas outlined) present. No swelling, edema, deformity, signs of trauma, lacerations or bony tenderness. Normal range of motion. Negative right straight leg raise test and negative left straight leg raise test. No scoliosis.  ?     Back: ? ?Skin: ?   General: Skin is warm and dry.  ?Neurological:  ?   General: No focal deficit present.  ?   Mental Status: She is alert and oriented to person, place, and time.  ?   Motor: No weakness.  ?   Coordination: Coordination normal.  ?   Gait: Gait normal.  ?   Deep Tendon Reflexes: Reflexes normal.  ?Psychiatric:     ?   Mood and Affect: Mood normal.     ?   Behavior: Behavior normal.     ?   Thought Content: Thought content normal.     ?   Judgment: Judgment normal.  ? ? ?DG Lumbar Spine Complete ? ?Result Date: 10/16/2021 ?CLINICAL DATA:  Low back pain EXAM: LUMBAR SPINE - COMPLETE 4+ VIEW COMPARISON:  No prior lumbar spine radiographs, correlation is made with chest radiographs 03/24/2021. FINDINGS: Vertebral body height loss at T12, which is age indeterminate but new compared to 03/24/2021. Alignment is normal. Trace anterolisthesis at L5-S1, with disc height loss and facet arthropathy. Disc heights  are otherwise maintained. IMPRESSION: 1. Vertebral body height loss at T12, which is age indeterminate but new compared to 03/24/2021. Correlate with point tenderness and consider an MRI if clinically indicated. 2. Degenerative changes, most prominent at L5-S1. Electronically Signed   By: Merilyn Baba M.D.   On: 10/16/2021 14:28   ? ?Assessment and Plan :  ? ?  PDMP not reviewed this encounter. ? ?1. Acute right-sided low back pain without sciatica   ?2. History of herniated intervertebral disc   ?3. Degenerative disc disease, lumbar   ? ?Given severity of patient's pain, recommended an oral prednisone course.  She can supplement this with tizanidine.  Emphasized need for follow-up with a spine specialist especially to continue pursuing the MRI. Counseled patient on potential for adverse effects with medications prescribed/recommended today, ER and return-to-clinic precautions discussed, patient verbalized understanding. ? ?  ?Jaynee Eagles, PA-C ?10/16/21 1524 ? ?

## 2021-10-17 ENCOUNTER — Ambulatory Visit (HOSPITAL_COMMUNITY)
Admission: RE | Admit: 2021-10-17 | Discharge: 2021-10-17 | Disposition: A | Discharge status: Home / Self Care | Payer: PPO | Source: Ambulatory Visit | Attending: Internal Medicine | Admitting: Internal Medicine

## 2021-10-17 ENCOUNTER — Other Ambulatory Visit: Payer: Self-pay | Admitting: Internal Medicine

## 2021-10-17 ENCOUNTER — Other Ambulatory Visit (HOSPITAL_COMMUNITY): Payer: Self-pay | Admitting: Internal Medicine

## 2021-10-17 DIAGNOSIS — G8929 Other chronic pain: Secondary | ICD-10-CM | POA: Diagnosis not present

## 2021-10-17 DIAGNOSIS — M47816 Spondylosis without myelopathy or radiculopathy, lumbar region: Secondary | ICD-10-CM | POA: Diagnosis not present

## 2021-10-17 DIAGNOSIS — M545 Low back pain, unspecified: Secondary | ICD-10-CM | POA: Insufficient documentation

## 2021-10-17 DIAGNOSIS — I10 Essential (primary) hypertension: Secondary | ICD-10-CM | POA: Diagnosis not present

## 2021-10-17 DIAGNOSIS — M5127 Other intervertebral disc displacement, lumbosacral region: Secondary | ICD-10-CM | POA: Diagnosis not present

## 2021-10-17 DIAGNOSIS — M5126 Other intervertebral disc displacement, lumbar region: Secondary | ICD-10-CM | POA: Diagnosis not present

## 2021-10-17 DIAGNOSIS — Z6841 Body Mass Index (BMI) 40.0 and over, adult: Secondary | ICD-10-CM | POA: Diagnosis not present

## 2021-10-26 ENCOUNTER — Other Ambulatory Visit: Payer: Self-pay | Admitting: Adult Health

## 2021-10-27 ENCOUNTER — Other Ambulatory Visit: Payer: Self-pay | Admitting: Adult Health

## 2021-10-31 DIAGNOSIS — S22080A Wedge compression fracture of T11-T12 vertebra, initial encounter for closed fracture: Secondary | ICD-10-CM | POA: Diagnosis not present

## 2021-10-31 DIAGNOSIS — Z6839 Body mass index (BMI) 39.0-39.9, adult: Secondary | ICD-10-CM | POA: Diagnosis not present

## 2021-11-11 DIAGNOSIS — M8008XG Age-related osteoporosis with current pathological fracture, vertebra(e), subsequent encounter for fracture with delayed healing: Secondary | ICD-10-CM | POA: Diagnosis not present

## 2021-11-11 DIAGNOSIS — M40294 Other kyphosis, thoracic region: Secondary | ICD-10-CM | POA: Diagnosis not present

## 2021-11-11 DIAGNOSIS — S22080A Wedge compression fracture of T11-T12 vertebra, initial encounter for closed fracture: Secondary | ICD-10-CM | POA: Diagnosis not present

## 2022-01-01 DIAGNOSIS — M810 Age-related osteoporosis without current pathological fracture: Secondary | ICD-10-CM | POA: Diagnosis not present

## 2022-01-20 ENCOUNTER — Other Ambulatory Visit: Payer: PPO

## 2022-01-29 ENCOUNTER — Other Ambulatory Visit: Payer: Self-pay | Admitting: Adult Health

## 2022-02-18 DIAGNOSIS — S22080D Wedge compression fracture of T11-T12 vertebra, subsequent encounter for fracture with routine healing: Secondary | ICD-10-CM | POA: Diagnosis not present

## 2022-02-18 DIAGNOSIS — Z6839 Body mass index (BMI) 39.0-39.9, adult: Secondary | ICD-10-CM | POA: Diagnosis not present

## 2022-02-18 DIAGNOSIS — M549 Dorsalgia, unspecified: Secondary | ICD-10-CM | POA: Diagnosis not present

## 2022-04-22 ENCOUNTER — Other Ambulatory Visit (HOSPITAL_COMMUNITY): Payer: Self-pay | Admitting: Internal Medicine

## 2022-04-22 DIAGNOSIS — Z1231 Encounter for screening mammogram for malignant neoplasm of breast: Secondary | ICD-10-CM

## 2022-05-06 ENCOUNTER — Ambulatory Visit (HOSPITAL_COMMUNITY): Payer: PPO

## 2022-05-07 ENCOUNTER — Ambulatory Visit (HOSPITAL_COMMUNITY)
Admission: RE | Admit: 2022-05-07 | Discharge: 2022-05-07 | Disposition: A | Discharge status: Home / Self Care | Payer: PPO | Source: Ambulatory Visit | Attending: Internal Medicine | Admitting: Internal Medicine

## 2022-05-07 DIAGNOSIS — Z1231 Encounter for screening mammogram for malignant neoplasm of breast: Secondary | ICD-10-CM | POA: Diagnosis not present

## 2022-05-09 ENCOUNTER — Other Ambulatory Visit: Payer: Self-pay | Admitting: Adult Health

## 2022-05-29 DIAGNOSIS — I10 Essential (primary) hypertension: Secondary | ICD-10-CM | POA: Diagnosis not present

## 2022-05-29 DIAGNOSIS — N390 Urinary tract infection, site not specified: Secondary | ICD-10-CM | POA: Diagnosis not present

## 2022-05-29 DIAGNOSIS — Z6841 Body Mass Index (BMI) 40.0 and over, adult: Secondary | ICD-10-CM | POA: Diagnosis not present

## 2022-07-12 ENCOUNTER — Other Ambulatory Visit: Payer: Self-pay | Admitting: Adult Health

## 2022-08-20 ENCOUNTER — Telehealth: Payer: Self-pay | Admitting: *Deleted

## 2022-08-20 NOTE — Progress Notes (Signed)
  Care Coordination   Note   [id number] Name: Kristina Mcdonald MRN: [id number] DOB: [id number]  Kristina Mcdonald is a 67 y.o. year old female who sees Sharilyn Sites, MD for primary care. I reached out to [name] by phone today to offer care coordination services.  [name] was given information about Care Coordination services today including:   The Care Coordination services include support from the care team which includes your Nurse Coordinator, Clinical Social Worker, or Pharmacist.  The Care Coordination team is here to help remove barriers to the health concerns and goals most important to you. Care Coordination services are voluntary, and the patient may decline or stop services at any time by request to their care team member.   Care Coordination Consent Status: Patient agreed to services and verbal consent obtained.   Follow up plan:  Telephone appointment with care coordination team member scheduled for:  09/04/22  Encounter Outcome:  Pt. Scheduled [place]  Direct Dial: [phone]

## 2022-09-04 ENCOUNTER — Ambulatory Visit: Payer: Self-pay | Admitting: *Deleted

## 2022-09-04 ENCOUNTER — Encounter: Payer: Self-pay | Admitting: *Deleted

## 2022-09-04 NOTE — Patient Outreach (Signed)
  Care Coordination   Initial Visit Note   10/27/256 Name: Kristina Mcdonald MRN: [id number] DOB: [birth year]  Kristina Mcdonald is a 67 y.o. year old female who sees Sharilyn Sites, MD for primary care. I spoke with  [name] by phone today.  What matters to the patients health and wellness today?  Ongoing self management of chronic medical conditions    Goals Addressed             This Visit's Progress    COMPLETED: Care Coordination Services (No Follow-up Required)       Care Coordination Goals: Patient will keep routine follow-up appointments with PCP Patient will reach out to PCP with any new or worsening symptoms related to back pain or other conditions Patient will continue to monitor and record blood pressure at least 3 times per week and reach out to PCP with any readings outside of recommended range Patient will take blood pressure log to provider visits for review Patient will reach out to [place] Coordinator [phone] with any care coordination or resource needs        SDOH assessments and interventions completed:  Yes  SDOH Interventions Today    Flowsheet Row Most Recent Value  SDOH Interventions   Food Insecurity Interventions Intervention Not Indicated  Housing Interventions Intervention Not Indicated  Transportation Interventions Intervention Not Indicated  Utilities Interventions Intervention Not Indicated  Financial Strain Interventions Intervention Not Indicated        Care Coordination Interventions:  Yes, provided  Interventions Today    Flowsheet Row Most Recent Value  Chronic Disease   Chronic disease during today's visit Hypertension (HTN)  General Interventions   General Interventions Discussed/Reviewed General Interventions Discussed, Annual Eye Exam, Durable Medical Equipment (DME), Vaccines, Doctor Visits  Vaccines Pneumonia, Shingles, Tetanus/Pertussis/Diphtheria, COVID-19, Flu  [updated vaccine history per [facility] records]  Doctor  Visits Discussed/Reviewed Doctor Visits Discussed, PCP  Durable Medical Equipment (DME) BP Cuff  PCP/Specialist Visits Compliance with follow-up visit  Exercise Interventions   Exercise Discussed/Reviewed Physical Activity  Physical Activity Discussed/Reviewed Physical Activity Discussed  Education Interventions   Education Provided Provided Education  Provided Verbal Education On Community Resources, When to see the doctor       Follow up plan: No further intervention required.   Encounter Outcome:  Pt. Visit Completed   [name], BSN, RN-BC RN Care Coordinator [place] Direct Dial: [phone] Main #: [phone]

## 2022-09-15 DIAGNOSIS — Z0001 Encounter for general adult medical examination with abnormal findings: Secondary | ICD-10-CM | POA: Diagnosis not present

## 2022-09-15 DIAGNOSIS — E039 Hypothyroidism, unspecified: Secondary | ICD-10-CM | POA: Diagnosis not present

## 2022-09-15 DIAGNOSIS — N390 Urinary tract infection, site not specified: Secondary | ICD-10-CM | POA: Diagnosis not present

## 2022-09-15 DIAGNOSIS — Z6841 Body Mass Index (BMI) 40.0 and over, adult: Secondary | ICD-10-CM | POA: Diagnosis not present

## 2022-09-15 DIAGNOSIS — I10 Essential (primary) hypertension: Secondary | ICD-10-CM | POA: Diagnosis not present

## 2022-09-15 DIAGNOSIS — D518 Other vitamin B12 deficiency anemias: Secondary | ICD-10-CM | POA: Diagnosis not present

## 2022-09-15 DIAGNOSIS — Z0000 Encounter for general adult medical examination without abnormal findings: Secondary | ICD-10-CM | POA: Diagnosis not present

## 2022-09-15 DIAGNOSIS — G9332 Myalgic encephalomyelitis/chronic fatigue syndrome: Secondary | ICD-10-CM | POA: Diagnosis not present

## 2022-09-15 DIAGNOSIS — E559 Vitamin D deficiency, unspecified: Secondary | ICD-10-CM | POA: Diagnosis not present

## 2022-09-15 DIAGNOSIS — R7309 Other abnormal glucose: Secondary | ICD-10-CM | POA: Diagnosis not present

## 2022-09-15 DIAGNOSIS — Z1331 Encounter for screening for depression: Secondary | ICD-10-CM | POA: Diagnosis not present

## 2022-09-15 DIAGNOSIS — N3281 Overactive bladder: Secondary | ICD-10-CM | POA: Diagnosis not present

## 2022-10-27 DIAGNOSIS — I10 Essential (primary) hypertension: Secondary | ICD-10-CM | POA: Diagnosis not present

## 2022-10-28 DIAGNOSIS — M549 Dorsalgia, unspecified: Secondary | ICD-10-CM | POA: Diagnosis not present

## 2022-11-04 ENCOUNTER — Other Ambulatory Visit: Payer: Self-pay

## 2022-11-04 ENCOUNTER — Emergency Department (HOSPITAL_COMMUNITY): Payer: PPO

## 2022-11-04 ENCOUNTER — Encounter (HOSPITAL_COMMUNITY): Payer: Self-pay | Admitting: Emergency Medicine

## 2022-11-04 ENCOUNTER — Emergency Department (HOSPITAL_COMMUNITY)
Admission: EM | Admit: 2022-11-04 | Discharge: 2022-11-04 | Disposition: A | Discharge status: Home / Self Care | Payer: PPO | Attending: Emergency Medicine | Admitting: Emergency Medicine

## 2022-11-04 ENCOUNTER — Other Ambulatory Visit: Payer: Self-pay | Admitting: Adult Health

## 2022-11-04 DIAGNOSIS — R519 Headache, unspecified: Secondary | ICD-10-CM | POA: Insufficient documentation

## 2022-11-04 DIAGNOSIS — Z7982 Long term (current) use of aspirin: Secondary | ICD-10-CM | POA: Insufficient documentation

## 2022-11-04 DIAGNOSIS — Z79899 Other long term (current) drug therapy: Secondary | ICD-10-CM | POA: Diagnosis not present

## 2022-11-04 DIAGNOSIS — I10 Essential (primary) hypertension: Secondary | ICD-10-CM | POA: Diagnosis not present

## 2022-11-04 DIAGNOSIS — R29818 Other symptoms and signs involving the nervous system: Secondary | ICD-10-CM | POA: Diagnosis not present

## 2022-11-04 DIAGNOSIS — R079 Chest pain, unspecified: Secondary | ICD-10-CM | POA: Diagnosis not present

## 2022-11-04 DIAGNOSIS — R030 Elevated blood-pressure reading, without diagnosis of hypertension: Secondary | ICD-10-CM | POA: Diagnosis present

## 2022-11-04 LAB — I-STAT CHEM 8, ED
BUN: 13 mg/dL (ref 8–23)
Calcium, Ion: 1.17 mmol/L (ref 1.15–1.40)
Chloride: 106 mmol/L (ref 98–111)
Creatinine, Ser: 0.6 mg/dL (ref 0.44–1.00)
Glucose, Bld: 106 mg/dL — ABNORMAL HIGH (ref 70–99)
HCT: 47 % — ABNORMAL HIGH (ref 36.0–46.0)
Hemoglobin: 16 g/dL — ABNORMAL HIGH (ref 12.0–15.0)
Potassium: 3.7 mmol/L (ref 3.5–5.1)
Sodium: 142 mmol/L (ref 135–145)
TCO2: 26 mmol/L (ref 22–32)

## 2022-11-04 LAB — COMPREHENSIVE METABOLIC PANEL
ALT: 35 U/L (ref 0–44)
AST: 28 U/L (ref 15–41)
Albumin: 4.7 g/dL (ref 3.5–5.0)
Alkaline Phosphatase: 78 U/L (ref 38–126)
Anion gap: 12 (ref 5–15)
BUN: 14 mg/dL (ref 8–23)
CO2: 24 mmol/L (ref 22–32)
Calcium: 9.5 mg/dL (ref 8.9–10.3)
Chloride: 103 mmol/L (ref 98–111)
Creatinine, Ser: 0.69 mg/dL (ref 0.44–1.00)
GFR, Estimated: >60 mL/min (ref >60)
Glucose, Bld: 107 mg/dL — ABNORMAL HIGH (ref 70–99)
Potassium: 3.5 mmol/L (ref 3.5–5.1)
Sodium: 139 mmol/L (ref 135–145)
Total Bilirubin: 0.5 mg/dL (ref 0.3–1.2)
Total Protein: 8.6 g/dL — ABNORMAL HIGH (ref 6.5–8.1)

## 2022-11-04 LAB — TROPONIN I (HIGH SENSITIVITY): Troponin I (High Sensitivity): 4 ng/L (ref <18)

## 2022-11-04 LAB — RAPID URINE DRUG SCREEN, HOSP PERFORMED
Amphetamines: NOT DETECTED
Barbiturates: NOT DETECTED
Benzodiazepines: POSITIVE — AB
Cocaine: NOT DETECTED
Opiates: NOT DETECTED
Tetrahydrocannabinol: NOT DETECTED

## 2022-11-04 LAB — DIFFERENTIAL
Abs Immature Granulocytes: 0.03 10*3/uL (ref 0.00–0.07)
Basophils Absolute: 0 10*3/uL (ref 0.0–0.1)
Basophils Relative: 1 %
Eosinophils Absolute: 0.2 10*3/uL (ref 0.0–0.5)
Eosinophils Relative: 3 %
Immature Granulocytes: 0 %
Lymphocytes Relative: 39 %
Lymphs Abs: 2.7 10*3/uL (ref 0.7–4.0)
Monocytes Absolute: 0.7 10*3/uL (ref 0.1–1.0)
Monocytes Relative: 10 %
Neutro Abs: 3.3 10*3/uL (ref 1.7–7.7)
Neutrophils Relative %: 47 %
Smear Review: DECREASED

## 2022-11-04 LAB — URINALYSIS, ROUTINE W REFLEX MICROSCOPIC
Bilirubin Urine: NEGATIVE
Glucose, UA: NEGATIVE mg/dL
Hgb urine dipstick: NEGATIVE
Ketones, ur: NEGATIVE mg/dL
Nitrite: NEGATIVE
Protein, ur: NEGATIVE mg/dL
Specific Gravity, Urine: 1.013 (ref 1.005–1.030)
pH: 5 (ref 5.0–8.0)

## 2022-11-04 LAB — CBC
HCT: 46.4 % — ABNORMAL HIGH (ref 36.0–46.0)
Hemoglobin: 15.6 g/dL — ABNORMAL HIGH (ref 12.0–15.0)
MCH: 30.6 pg (ref 26.0–34.0)
MCHC: 33.6 g/dL (ref 30.0–36.0)
MCV: 91.2 fL (ref 80.0–100.0)
Platelets: DECREASED 10*3/uL (ref 150–400)
RBC: 5.09 MIL/uL (ref 3.87–5.11)
RDW: 12.3 % (ref 11.5–15.5)
WBC: 7 10*3/uL (ref 4.0–10.5)
nRBC: 0 % (ref 0.0–0.2)

## 2022-11-04 LAB — PROTIME-INR
INR: 1 (ref 0.8–1.2)
Prothrombin Time: 12.8 seconds (ref 11.4–15.2)

## 2022-11-04 LAB — APTT: aPTT: 29 seconds (ref 24–36)

## 2022-11-04 LAB — CBG MONITORING, ED: Glucose-Capillary: 110 mg/dL — ABNORMAL HIGH (ref 70–99)

## 2022-11-04 LAB — ETHANOL: Alcohol, Ethyl (B): <10 mg/dL (ref <10)

## 2022-11-04 MED ORDER — AMLODIPINE BESYLATE 5 MG PO TABS
5.0000 mg | ORAL_TABLET | Freq: Every day | ORAL | 1 refills | Status: AC
  Filled 2023-08-11 – 2023-08-24 (×2): qty 90, 90d supply, fill #0

## 2022-11-04 MED ORDER — AMLODIPINE BESYLATE 5 MG PO TABS
5.0000 mg | ORAL_TABLET | Freq: Once | ORAL | Status: AC
  Administered 2022-11-04: 5 mg via ORAL
  Filled 2022-11-04: qty 1

## 2022-11-04 NOTE — ED Triage Notes (Signed)
Pt states she felt  a "jolt" from her left toes up her whole left side. Went to DR fusco office and was told to come here due to high bp. Pt states the jolt was only a few seconds. This jolt happened about 230pm and since then had felt heavy on left arm and left leg with diarrhea. Pt a/o. Color wnl. Ambulatory with steady gait. See [facility], edp aware

## 2022-11-04 NOTE — ED Provider Notes (Signed)
[place] EMERGENCY DEPARTMENT AT [facility] Provider Note   CSN: [id number] Arrival date & time: 11/04/22  1553     History  Chief Complaint  Patient presents with   Pain    Kristina Mcdonald is a 67 y.o. female.  Pt is a 67 yo female with pmhx significant for htn, hld, overactive bladder, thrombocytopenia, cad, insomnia, and hx tubular adenoma.  Pt said she felt a jolt to the left side of her body around 2:30 pm.  Pt said her bp has also been running high every time she checks it.  It looks like she was on hyzaar, but is no longer on anything for bp.  She is not sure why.  She did go to pcp.  BP elevated there, so she was told to come to the ED.  Pt denies any weakness.  No speech problems.  No vision problems.  Pt is able to ambulate.       Home Medications Prior to Admission medications   Medication Sig Start Date End Date Taking? Authorizing Provider  amLODipine (NORVASC) 5 MG tablet Take 1 tablet (5 mg total) by mouth daily. 11/04/22  Yes Jacalyn Lefevre, MD  ALPRAZolam Prudy Feeler) 0.5 MG tablet Take 0.5 mg by mouth 4 (four) times daily as needed. 02/18/21   [provider]  aspirin EC 81 MG tablet Take 81 mg by mouth daily. 09/05/15   [provider]  atorvastatin (LIPITOR) 80 MG tablet Take 80 mg by mouth daily.    [provider]  cetirizine (ZYRTEC) 10 MG tablet Take 10 mg by mouth at bedtime.     [provider]  cyanocobalamin (VITAMIN B12) 1000 MCG tablet Take 1,000 mcg by mouth daily. 09/05/15   [provider]  dicyclomine (BENTYL) 20 MG tablet Take 20 mg by mouth 3 (three) times daily before meals. 07/24/15   [provider]  ezetimibe (ZETIA) 10 MG tablet Take 10 mg by mouth daily.    [provider]  levothyroxine (SYNTHROID) 100 MCG tablet Take 100 mcg by mouth daily. 03/17/21   [provider]  losartan-hydrochlorothiazide (HYZAAR) 50-12.5 MG tablet Take 1 tablet by mouth daily. 08/11/15    [provider]  mirtazapine (REMERON) 15 MG tablet Take by mouth at bedtime. 07/27/22   [provider]  MYRBETRIQ 50 MG TB24 tablet TAKE 1 TABLET BY MOUTH EVERY DAY 01/29/22   Cyril Mourning A, NP  oxybutynin (DITROPAN-XL) 10 MG 24 hr tablet Take 10 mg by mouth at bedtime. 07/22/15   [provider]  pantoprazole (PROTONIX) 40 MG tablet Take 40 mg by mouth daily. 12/13/13   [provider]  PARoxetine (PAXIL) 20 MG tablet TAKE 1 TABLET BY MOUTH EVERY DAY 05/11/22   Adline Potter, NP  predniSONE (DELTASONE) 10 MG tablet Take 3 tablets (30 mg total) by mouth daily with breakfast. 10/16/21   Wallis Bamberg, PA-C  tiZANidine (ZANAFLEX) 4 MG tablet Take 1 tablet (4 mg total) by mouth at bedtime. 10/16/21   Wallis Bamberg, PA-C  triamcinolone ointment (KENALOG) 0.5 % APPLY TO AFFECTED AREA TWICE A DAY 07/13/22   Adline Potter, NP      Allergies    Patient has no known allergies.    Review of Systems   Review of Systems  Neurological:  Positive for numbness.  All other systems reviewed and are negative.   Physical Exam Updated Vital Signs BP (!) 174/79   Pulse (!) 107   Temp 98.8  F (37.1 C) (Oral)   Resp 12   SpO2 94%  Physical Exam Vitals and nursing note reviewed.  Constitutional:      Appearance: Normal appearance. She is obese.  HENT:     Head: Normocephalic and atraumatic.     Right Ear: External ear normal.     Left Ear: External ear normal.     Nose: Nose normal.     Mouth/Throat:     Mouth: Mucous membranes are moist.     Pharynx: Oropharynx is clear.  Eyes:     Extraocular Movements: Extraocular movements intact.     Conjunctiva/sclera: Conjunctivae normal.     Pupils: Pupils are equal, round, and reactive to light.  Cardiovascular:     Rate and Rhythm: Normal rate and regular rhythm.     Pulses: Normal pulses.     Heart sounds: Normal heart sounds.  Pulmonary:     Effort: Pulmonary effort is normal.     Breath sounds: Normal  breath sounds.  Abdominal:     General: Abdomen is flat. Bowel sounds are normal.     Palpations: Abdomen is soft.  Musculoskeletal:        General: Normal range of motion.     Cervical back: Normal range of motion and neck supple.  Skin:    General: Skin is warm.     Capillary Refill: Capillary refill takes less than 2 seconds.  Neurological:     General: No focal deficit present.     Mental Status: She is alert and oriented to person, place, and time.  Psychiatric:        Mood and Affect: Mood normal.        Behavior: Behavior normal.     ED Results / Procedures / Treatments   Labs (all labs ordered are listed, but only abnormal results are displayed) Labs Reviewed  CBC - Abnormal; Notable for the following components:      Result Value   Hemoglobin 15.6 (*)    HCT 46.4 (*)    All other components within normal limits  COMPREHENSIVE METABOLIC PANEL - Abnormal; Notable for the following components:   Glucose, Bld 107 (*)    Total Protein 8.6 (*)    All other components within normal limits  RAPID URINE DRUG SCREEN, HOSP PERFORMED - Abnormal; Notable for the following components:   Benzodiazepines POSITIVE (*)    All other components within normal limits  URINALYSIS, ROUTINE W REFLEX MICROSCOPIC - Abnormal; Notable for the following components:   APPearance HAZY (*)    Leukocytes,Ua MODERATE (*)    Bacteria, UA RARE (*)    All other components within normal limits  CBG MONITORING, ED - Abnormal; Notable for the following components:   Glucose-Capillary 110 (*)    All other components within normal limits  I-STAT CHEM 8, ED - Abnormal; Notable for the following components:   Glucose, Bld 106 (*)    Hemoglobin 16.0 (*)    HCT 47.0 (*)    All other components within normal limits  ETHANOL  PROTIME-INR  APTT  DIFFERENTIAL  TROPONIN I (HIGH SENSITIVITY)    EKG EKG Interpretation  Date/Time:  Wednesday Nov 04 2022 16:58:41 EDT Ventricular Rate:  112 PR  Interval:  140 QRS Duration: 85 QT Interval:  332 QTC Calculation: 454 R Axis:   40 Text Interpretation: Sinus tachycardia Abnormal R-wave progression, early transition Probable inferior infarct, age indeterminate No significant change since last tracing Confirmed by Jacalyn Lefevre [id number]) on 11/04/2022  6:59:17 PM  Radiology DG Chest Portable 1 View  Result Date: 11/04/2022 CLINICAL DATA:  Chest pain, elevated blood pressure EXAM: PORTABLE CHEST 1 VIEW COMPARISON:  Portable exam 1716 hours compared to 03/24/2021 FINDINGS: Normal heart size, mediastinal contours, and pulmonary vascularity. Minimal chronic peribronchial thickening. Lungs clear. No pulmonary infiltrate, pleural effusion, or pneumothorax. Osseous structures unremarkable. IMPRESSION: Minimal chronic bronchitic changes without infiltrate. Electronically Signed   By: Ulyses Southward M.D.   On: 11/04/2022 17:34   CT HEAD WO CONTRAST  Result Date: 11/04/2022 CLINICAL DATA:  Headache, neuro deficit. EXAM: CT HEAD WITHOUT CONTRAST TECHNIQUE: Contiguous axial images were obtained from the base of the skull through the vertex without intravenous contrast. RADIATION DOSE REDUCTION: This exam was performed according to the departmental dose-optimization program which includes automated exposure control, adjustment of the mA and/or kV according to patient size and/or use of iterative reconstruction technique. COMPARISON:  Head CT 03/24/2017.  MRI brain 03/24/2017. FINDINGS: Brain: No acute intracranial hemorrhage. Gray-white differentiation is preserved. No hydrocephalus or extra-axial collection. No mass effect or midline shift. Vascular: No hyperdense vessel or unexpected calcification. Skull: No calvarial fracture or suspicious bone lesion. Skull base is unremarkable. Sinuses/Orbits: Mild mucosal disease in the right sphenoid sinus and bilateral ethmoid air cells. Orbits are unremarkable. Other: None. IMPRESSION: 1. No acute intracranial abnormality. 2.  Mild paranasal sinus disease. Electronically Signed   By: Orvan Falconer M.D.   On: 11/04/2022 16:59    Procedures Procedures    Medications Ordered in ED Medications  amLODipine (NORVASC) tablet 5 mg (5 mg Oral Given 11/04/22 1836)    ED Course/ Medical Decision Making/ A&P                             Medical Decision Making Amount and/or Complexity of Data Reviewed Labs: ordered. Radiology: ordered.  Risk Prescription drug management.   This patient presents to the ED for concern of htn and left arm/leg numbness, this involves an extensive number of treatment options, and is a complaint that carries with it a high risk of complications and morbidity.  The differential diagnosis includes cva, tia, htn emergency   Co morbidities that complicate the patient evaluation  htn, hld, overactive bladder, thrombocytopenia, cad, insomnia, and hx tubular adenoma   Additional history obtained:  Additional history obtained from epic chart review External records from outside source obtained and reviewed including husband   Lab Tests:  I Ordered, and personally interpreted labs.  The pertinent results include:  cbc nl, cmp nl, inr 1.0, ua neg, uds + bzd   Imaging Studies ordered:  I ordered imaging studies including ct head/cxr  I independently visualized and interpreted imaging which showed  CXR: Minimal chronic bronchitic changes without infiltrate.  CT head: No acute intracranial abnormality.  2. Mild paranasal sinus disease.   I agree with the radiologist interpretation   Cardiac Monitoring:  The patient was maintained on a cardiac monitor.  I personally viewed and interpreted the cardiac monitored which showed an underlying rhythm of: st   Medicines ordered and prescription drug management:  I ordered medication including amlodipine  for htn  Reevaluation of the patient after these medicines showed that the patient improved I have reviewed the patients home  medicines and have made adjustments as needed   Test Considered:  mri  Problem List / ED Course:  Left arm/leg numbness:  improved.  We don't have MRI available now.  I recommended  that she go to [facility] for [facility] and see neuro if +.  However, she wants to go home.  She knows to return if worse. HTN:  it is unclear to patient why she is not on anything for bp, but I started her on amlodipine.  She is to f/u with pcp.   Reevaluation:  After the interventions noted above, I reevaluated the patient and found that they have :improved   Social Determinants of Health:  Lives at home   Dispostion:  After consideration of the diagnostic results and the patients response to treatment, I feel that the patent would benefit from discharge with outpatient f/u.          Final Clinical Impression(s) / ED Diagnoses Final diagnoses:  Hypertension, unspecified type    Rx / DC Orders ED Discharge Orders          Ordered    amLODipine (NORVASC) 5 MG tablet  Daily        11/04/22 1828              Jacalyn Lefevre, MD 11/04/22 1903

## 2022-11-05 DIAGNOSIS — N3281 Overactive bladder: Secondary | ICD-10-CM | POA: Diagnosis not present

## 2022-11-05 DIAGNOSIS — I10 Essential (primary) hypertension: Secondary | ICD-10-CM | POA: Diagnosis not present

## 2022-11-05 DIAGNOSIS — R030 Elevated blood-pressure reading, without diagnosis of hypertension: Secondary | ICD-10-CM | POA: Diagnosis not present

## 2022-11-05 DIAGNOSIS — Z6839 Body mass index (BMI) 39.0-39.9, adult: Secondary | ICD-10-CM | POA: Diagnosis not present

## 2022-11-05 DIAGNOSIS — E6609 Other obesity due to excess calories: Secondary | ICD-10-CM | POA: Diagnosis not present

## 2022-11-11 ENCOUNTER — Telehealth: Payer: Self-pay | Admitting: *Deleted

## 2022-11-11 NOTE — Telephone Encounter (Signed)
Transition Care Management Follow-up Telephone Call Date of discharge and from where: Kristina Mcdonald 11/11/2022 How have you been since you were released from the hospital? 7days Any questions or concerns? No  Items Reviewed: Did the pt receive and understand the discharge instructions provided? Yes  Medications obtained and verified? Yes  Other? No  Any new allergies since your discharge? No  Dietary orders reviewed? No Do you have support at home? Yes   Follow up appointments reviewed:  PCP Hospital f/u appt confirmed? Yes  Scheduled   Are transportation arrangements needed? No  If their condition worsens, is the pt aware to call PCP or go to the Emergency Dept.? Yes Was the patient provided with contact information for the PCP's office or ED? Yes Was to pt encouraged to call back with questions or concerns? Yes

## 2022-11-24 ENCOUNTER — Ambulatory Visit (HOSPITAL_COMMUNITY): Payer: PPO | Attending: Neurosurgery

## 2022-11-24 ENCOUNTER — Other Ambulatory Visit: Payer: Self-pay

## 2022-11-24 DIAGNOSIS — M545 Low back pain, unspecified: Secondary | ICD-10-CM | POA: Insufficient documentation

## 2022-11-24 DIAGNOSIS — M546 Pain in thoracic spine: Secondary | ICD-10-CM | POA: Insufficient documentation

## 2022-11-24 DIAGNOSIS — R293 Abnormal posture: Secondary | ICD-10-CM | POA: Insufficient documentation

## 2022-11-24 NOTE — Therapy (Signed)
OUTPATIENT PHYSICAL THERAPY THORACOLUMBAR EVALUATION   Patient Name: LENOLA CAPITANO MRN: [id number] DOB:Apr 06, 1956, 67 y.o., female Today's Date: 11/24/2022  END OF SESSION:  PT End of Session - 11/24/22 0955     Visit Number 1    Number of Visits 8    Date for PT Re-Evaluation 12/22/22    Authorization Type [employer]    PT Start Time 0950    PT Stop Time 1030    PT Time Calculation (min) 40 min    Activity Tolerance Patient tolerated treatment well    Behavior During Therapy [facility] for tasks assessed/performed             Past Medical History:  Diagnosis Date   BV (bacterial vaginosis) 11/22/2013   CAD (coronary artery disease) 02/2001   Nonobstructive per cath.   Chronic anxiety    Hyperlipidemia    Hypertension    Hypothyroidism 10/2006   Status post thyroidectomy for thyroid mass.   Insomnia 05/10/2013   Internal hemorrhoids 09/2003   LLQ pain 10/09/2014   Overactive bladder    Seasonal allergies    Stress 05/10/2013   Stroke (HCC)    Thrombocytopenia (HCC)    Chronic   Thrush, oral 11/22/2013   Tubular adenoma    Vaginal discharge 11/22/2013   Vaginal odor 10/09/2014   Past Surgical History:  Procedure Laterality Date   ABDOMINAL HYSTERECTOMY     APPENDECTOMY  11/2002.   Incidental during laparotomy and excision of ovarian remnant.   CARDIAC CATHETERIZATION  02/2001   Nonobstructive CAD   COLONOSCOPY  09/2003   Per Dr. Jena Gauss. Internal hemorrhoids.normal otherwise   COLONOSCOPY N/A 12/04/2013   Dr. Jena Gauss- normal rectum aside from minimal internal hemorrhoids. the patient had a 6mm polyp in the mid ascending segment and an 8mm poylp in the mid descending segment o/w the remaider of the colonic mucosa appeared normal. the distal 10cm of terminal mucosa also appeared normal.tubular adenoma on bx   ESOPHAGOGASTRODUODENOSCOPY N/A 12/04/2013   Dr.Rourk- distal esophageal erosions w/i 5mm of the GE junction. no basrrett's esophagus. tubular esophagus patent  throughout its course. focal antral erosions o/w normal appearing gastric mucosa. patent pylorus, normal first and second portion of the duodenum. mild chronic inflammation on bx   INNER EAR SURGERY     ear drum   LAPAROTOMY  11/2002   Excision of ovarian remnant   THYROIDECTOMY  10/2006   Neg for cancer per patient   TOTAL ABDOMINAL HYSTERECTOMY W/ BILATERAL SALPINGOOPHORECTOMY  09/2001.   Patient Active Problem List   Diagnosis Date Noted   Psoriasis 07/11/2021   History of UTI 05/16/2021   Anxiety and depression 04/18/2021   Cystocele, midline 04/18/2021   Urinary frequency 04/18/2021   Urge incontinence of urine 04/18/2021   Night sweats 04/18/2021   Hematuria 04/18/2021   Vaginal odor 10/09/2014   LLQ pain 10/09/2014   Diarrhea 12/14/2013   Loose stools 11/29/2013   Nausea with vomiting 11/29/2013   Vaginal discharge 11/22/2013   BV (bacterial vaginosis) 11/22/2013   Thrush, oral 11/22/2013   Insomnia 05/10/2013   Stress 05/10/2013   Left sided numbness 10/28/2011   Left-sided weakness 10/28/2011   Thrombocytopenia (HCC) 10/28/2011   Acute thrombotic stroke (HCC) 10/28/2011   Hypertension 10/28/2011   Hyperlipidemia 10/28/2011    PCP: Assunta Found, MD  REFERRING PROVIDER: Bedelia Person, MD  REFERRING DIAG:  Diagnosis  M54.9 (ICD-10-CM) - Dorsalgia, unspecified    Rationale for Evaluation and Treatment: Rehabilitation  THERAPY  DIAG:  Low back pain, unspecified back pain laterality, unspecified chronicity, unspecified whether sciatica present - Plan: PT plan of care cert/re-cert  Abnormal posture - Plan: PT plan of care cert/re-cert  Pain in thoracic spine - Plan: PT plan of care cert/re-cert  ONSET DATE: April 2023  SUBJECTIVE:                                                                                                                                                                                           SUBJECTIVE STATEMENT: Picked up a cinder  block in April of last year; had a compression fracture and had kyphoplasty; helped but then in Feb/March of this year; started having pain in back again; x-rayed again and no change.  Did have a fall last Friday and landed on left side so having some soreness there.    PERTINENT HISTORY:  Compression fracture April 2023  PAIN:  Are you having pain? Yes: NPRS scale: 0-8/10 Pain location: midback right side Pain description: throbbing Aggravating factors: bending over, dishes and laundry Relieving factors: take tylenol, relax, rest  PRECAUTIONS: None  WEIGHT BEARING RESTRICTIONS: No  FALLS:  Has patient fallen in last 6 months? Yes. Number of falls 1 on Friday in closet    OCCUPATION: retired  PLOF: Independent  PATIENT GOALS: get my back pain gone  NEXT MD VISIT: after therapy  OBJECTIVE:   DIAGNOSTIC FINDINGS:    CLINICAL DATA:  Low back pain, bilateral leg pain, weakness   EXAM: MRI LUMBAR SPINE WITHOUT CONTRAST   TECHNIQUE: Multiplanar, multisequence MR imaging of the lumbar spine was performed. No intravenous contrast was administered.   COMPARISON:  None.   FINDINGS: Segmentation:  Standard.   Alignment:  No significant listhesis.   Vertebrae: Lumbar vertebral body heights are maintained. There is loss of height at the superior endplate of T12 measuring less than 50%. Fracture line and is visible with minimal marrow edema. Minimal superior endplate retropulsion. Marrow signal is otherwise mildly heterogeneous without suspicious osseous lesion.   Conus medullaris and cauda equina: Conus extends to the L1 level. Conus and cauda equina appear normal.   Paraspinal and other soft tissues: Unremarkable.   Disc levels:   L1-L2: Shallow left subarticular disc protrusion. Mild facet arthropathy. No canal or foraminal stenosis.   L2-L3:  Mild facet arthropathy.  No canal or foraminal stenosis.   L3-L4: Disc bulge. Mild facet arthropathy with ligamentum  flavum infolding. No canal or foraminal stenosis.   L4-L5: Disc bulge with superimposed shallow central protrusion. Mild facet arthropathy with ligamentum flavum infolding. No canal stenosis. Slight effacement of the subarticular recesses. No foraminal  stenosis.   L5-S1: Disc bulge with endplate osteophytic ridging. Mild facet arthropathy. No canal stenosis. Mild foraminal stenosis.   IMPRESSION: Likely subacute mild T12 compression fracture.   Mild multilevel degenerative changes as detailed above.   PATIENT SURVEYS:  Modified Oswestry 18/50 36 %  COGNITION: Overall cognitive status: Within functional limits for tasks assessed     SENSATION: WFL  POSTURE: rounded shoulders, forward head, and increased thoracic kyphosis  PALPATION: Tender T11/T12 paraspinals; right side > left  LUMBAR ROM:   AROM eval  Flexion 60% available  Extension 40% availalble*  Right lateral flexion 30% available  Left lateral flexion 20% available* most painful  Right rotation   Left rotation    (Blank rows = not tested)  LOWER EXTREMITY ROM:     Active  Right eval Left eval  Hip flexion    Hip extension    Hip abduction    Hip adduction    Hip internal rotation    Hip external rotation    Knee flexion    Knee extension    Ankle dorsiflexion    Ankle plantarflexion    Ankle inversion    Ankle eversion     (Blank rows = not tested)  LOWER EXTREMITY MMT:    MMT Right eval Left eval  Shoulder flexion 4* 4+  Hip flexion 4+ 4+  Hip extension    Hip abduction    Hip adduction    Hip internal rotation    Hip external rotation    Knee flexion    Knee extension 4+ 4+  Ankle dorsiflexion 4 4  Ankle plantarflexion    Ankle inversion    Ankle eversion     (Blank rows = not tested)   FUNCTIONAL TESTS:  5 times sit to stand: 24.08 sec  TODAY'S TREATMENT:                                                                                                                               DATE: 11/24/22 physical therapy evaluation and HEP instruction    PATIENT EDUCATION:  Education details: Patient educated on exam findings, POC, scope of PT, HEP, and what to expect next visit; proper lifting techniques. Person educated: Patient Education method: Explanation, Demonstration, and Handouts Education comprehension: verbalized understanding, returned demonstration, verbal cues required, and tactile cues required   HOME EXERCISE PROGRAM: Access Code: [email or web address] URL: [email or web address] Date: 11/24/2022 Prepared by: [facility] - Rehab  Exercises - Seated Thoracic Lumbar Extension  - 2 x daily - 7 x weekly - 1 sets - 10 reps - Seated Scapular Retraction  - 2 x daily - 7 x weekly - 1 sets - 10 reps  ASSESSMENT:  CLINICAL IMPRESSION: Patient is a 67 y.o. female who was seen today for physical therapy evaluation and treatment for  Dorsalgia, unspecified. Patient demonstrates muscle weakness, reduced ROM, and fascial restrictions which are likely contributing to symptoms of pain and are negatively impacting patient ability  to perform ADLs and functional mobility tasks. Patient will benefit from skilled physical therapy services to address these deficits to reduce pain and improve level of function with ADLs and functional mobility tasks.    OBJECTIVE IMPAIRMENTS: decreased activity tolerance, decreased knowledge of condition, decreased mobility, difficulty walking, decreased ROM, decreased strength, hypomobility, increased fascial restrictions, impaired perceived functional ability, impaired flexibility, impaired UE functional use, postural dysfunction, and pain.   ACTIVITY LIMITATIONS: carrying, lifting, bending, standing, squatting, reach over head, and caring for others  PARTICIPATION LIMITATIONS: meal prep, cleaning, laundry, driving, shopping, and community activity  REHAB POTENTIAL: Good  CLINICAL DECISION MAKING: Stable/uncomplicated  EVALUATION COMPLEXITY:  Low   GOALS: Goals reviewed with patient? No  SHORT TERM GOALS: Target date: 12/08/2022  patient will be independent with initial HEP  Baseline: Goal status: INITIAL  2.  Patient will self report 30% improvement to improve tolerance for functional activity  Baseline:  Goal status: INITIAL    LONG TERM GOALS: Target date: 12/21/2022  Patient will be independent in self management strategies to improve quality of life and functional outcomes.  Baseline:  Goal status: INITIAL  2.  Patient will self report 50% improvement to improve tolerance for functional activity  Baseline:  Goal status: INITIAL  3.  Patient will improve modified Oswestry score by 10 points to demonstrate improved functional mobility Baseline: 18/50 Goal status: INITIAL  4.  Patient will demonstrate lumbar mobility grossly throughout wfl without pain Baseline:  Goal status: INITIAL    PLAN:  PT FREQUENCY: 1-2x/week  PT DURATION: 4 weeks  PLANNED INTERVENTIONS: Therapeutic exercises, Therapeutic activity, Neuromuscular re-education, Balance training, Gait training, Patient/Family education, Joint manipulation, Joint mobilization, Stair training, Orthotic/Fit training, DME instructions, Aquatic Therapy, Dry Needling, Electrical stimulation, Spinal manipulation, Spinal mobilization, Cryotherapy, Moist heat, Compression bandaging, scar mobilization, Splintting, Taping, Traction, Ultrasound, Ionotophoresis 4mg /ml Dexamethasone, and Manual therapy .  PLAN FOR NEXT SESSION: Review HEP and goals; postural strengthening, core strengthening; avoid standing lumbar flexion and loading the spine in standing   10:33 AM, 11/24/22 Shreyan Hinz Small Odel Schmid MPT [facility] [place] [phone] Ph:[phone]

## 2022-11-25 DIAGNOSIS — N390 Urinary tract infection, site not specified: Secondary | ICD-10-CM | POA: Diagnosis not present

## 2022-11-25 DIAGNOSIS — Z6841 Body Mass Index (BMI) 40.0 and over, adult: Secondary | ICD-10-CM | POA: Diagnosis not present

## 2022-11-25 DIAGNOSIS — N3281 Overactive bladder: Secondary | ICD-10-CM | POA: Diagnosis not present

## 2022-11-30 ENCOUNTER — Encounter (HOSPITAL_COMMUNITY): Payer: PPO

## 2022-12-02 ENCOUNTER — Encounter (HOSPITAL_COMMUNITY): Payer: PPO

## 2022-12-09 ENCOUNTER — Encounter (HOSPITAL_COMMUNITY): Payer: PPO

## 2022-12-11 ENCOUNTER — Encounter (HOSPITAL_COMMUNITY): Payer: PPO

## 2022-12-15 ENCOUNTER — Encounter (HOSPITAL_COMMUNITY): Payer: PPO

## 2022-12-17 ENCOUNTER — Encounter (HOSPITAL_COMMUNITY): Payer: PPO

## 2022-12-22 ENCOUNTER — Encounter (HOSPITAL_COMMUNITY): Payer: PPO

## 2022-12-24 ENCOUNTER — Encounter (HOSPITAL_COMMUNITY): Payer: PPO

## 2022-12-28 ENCOUNTER — Encounter (HOSPITAL_COMMUNITY): Payer: PPO

## 2022-12-30 ENCOUNTER — Encounter (HOSPITAL_COMMUNITY): Payer: PPO

## 2023-02-18 DIAGNOSIS — Z6841 Body Mass Index (BMI) 40.0 and over, adult: Secondary | ICD-10-CM | POA: Diagnosis not present

## 2023-02-18 DIAGNOSIS — F419 Anxiety disorder, unspecified: Secondary | ICD-10-CM | POA: Diagnosis not present

## 2023-02-18 DIAGNOSIS — D518 Other vitamin B12 deficiency anemias: Secondary | ICD-10-CM | POA: Diagnosis not present

## 2023-02-18 DIAGNOSIS — G9332 Myalgic encephalomyelitis/chronic fatigue syndrome: Secondary | ICD-10-CM | POA: Diagnosis not present

## 2023-02-18 DIAGNOSIS — R7309 Other abnormal glucose: Secondary | ICD-10-CM | POA: Diagnosis not present

## 2023-02-18 DIAGNOSIS — E559 Vitamin D deficiency, unspecified: Secondary | ICD-10-CM | POA: Diagnosis not present

## 2023-02-18 DIAGNOSIS — N3281 Overactive bladder: Secondary | ICD-10-CM | POA: Diagnosis not present

## 2023-02-18 DIAGNOSIS — R739 Hyperglycemia, unspecified: Secondary | ICD-10-CM | POA: Diagnosis not present

## 2023-02-18 DIAGNOSIS — N390 Urinary tract infection, site not specified: Secondary | ICD-10-CM | POA: Diagnosis not present

## 2023-02-18 DIAGNOSIS — E039 Hypothyroidism, unspecified: Secondary | ICD-10-CM | POA: Diagnosis not present

## 2023-03-10 ENCOUNTER — Other Ambulatory Visit: Payer: Self-pay | Admitting: Adult Health

## 2023-03-19 ENCOUNTER — Ambulatory Visit: Payer: PPO | Admitting: Adult Health

## 2023-03-19 ENCOUNTER — Encounter: Payer: Self-pay | Admitting: Adult Health

## 2023-03-19 VITALS — BP 171/80 | HR 95 | Ht 60.0 in | Wt 206.5 lb

## 2023-03-19 DIAGNOSIS — R350 Frequency of micturition: Secondary | ICD-10-CM | POA: Diagnosis not present

## 2023-03-19 DIAGNOSIS — R300 Dysuria: Secondary | ICD-10-CM

## 2023-03-19 DIAGNOSIS — R0602 Shortness of breath: Secondary | ICD-10-CM | POA: Diagnosis not present

## 2023-03-19 DIAGNOSIS — N8111 Cystocele, midline: Secondary | ICD-10-CM

## 2023-03-19 DIAGNOSIS — R309 Painful micturition, unspecified: Secondary | ICD-10-CM | POA: Diagnosis not present

## 2023-03-19 DIAGNOSIS — L900 Lichen sclerosus et atrophicus: Secondary | ICD-10-CM

## 2023-03-19 DIAGNOSIS — I10 Essential (primary) hypertension: Secondary | ICD-10-CM

## 2023-03-19 DIAGNOSIS — Z1211 Encounter for screening for malignant neoplasm of colon: Secondary | ICD-10-CM | POA: Diagnosis not present

## 2023-03-19 DIAGNOSIS — G479 Sleep disorder, unspecified: Secondary | ICD-10-CM | POA: Insufficient documentation

## 2023-03-19 DIAGNOSIS — R5383 Other fatigue: Secondary | ICD-10-CM

## 2023-03-19 DIAGNOSIS — Z01419 Encounter for gynecological examination (general) (routine) without abnormal findings: Secondary | ICD-10-CM | POA: Diagnosis not present

## 2023-03-19 LAB — POCT URINALYSIS DIPSTICK
Glucose, UA: NEGATIVE
Ketones, UA: NEGATIVE
Nitrite, UA: NEGATIVE
Protein, UA: NEGATIVE

## 2023-03-19 LAB — HEMOCCULT GUIAC POC 1CARD (OFFICE): Fecal Occult Blood, POC: NEGATIVE

## 2023-03-19 MED ORDER — CLOBETASOL PROPIONATE 0.05 % EX OINT: TOPICAL_OINTMENT | CUTANEOUS | 3 refills | Status: DC

## 2023-03-19 NOTE — Progress Notes (Signed)
Patient ID: Kristina Mcdonald, female   DOB: 1955-11-17, 67 y.o.   MRN: [id number] History of Present Illness: Kristina Mcdonald is a 67 year old white female, married, sp hysterectomy in for a well woman gyn exam. She is complaining of burning with urination and frequency for about a week. Has seen PCP and treated for UTI and yeast about  2 times in last 4 months. She is not sleeping well, mind races and feels tired and has [facility] with exertion. She said she told PCP and had labs, which she thinks was normal. She says she has gained weight too.  PCP is Dr Phillips Odor.   Current Medications, Allergies, Past Medical History, Past Surgical History, Family History and Social History were reviewed in [employer] record.     Review of Systems: Patient denies any headaches, hearing loss,  blurred vision,  chest pain, abdominal pain, problems with bowel movements,  or intercourse(not active). No joint pain or mood swings.  See HPI for positives   Physical Exam:BP (!) 171/80 (BP Location: Right Arm, Patient Position: Sitting, Cuff Size: Large)   Pulse 95   Ht 5' (1.524 m)   Wt 206 lb 8 oz (93.7 kg)   BMI 40.33 kg/m  urine dipstick was 3+ leuks, trace blood  General:  Well developed, well nourished, no acute distress Skin:  Warm and dry Neck:  Midline trachea, normal thyroid, good ROM, no lymphadenopathy,no carotid bruits heard Lungs; Clear to auscultation bilaterally Breast:  No dominant palpable mass, retraction, or nipple discharge Cardiovascular: Regular rate and rhythm Abdomen:  Soft, non tender, no hepatosplenomegaly Pelvic:  External genitalia is normal in appearance, no lesions.  The vagina is pale, has thin red tissue at introitus. +cystocele. Urethra has no lesions or masses. The cervix and uterus are absent.  No adnexal masses or tenderness noted.Bladder is non tender, no masses felt. Rectal: Good sphincter tone, no polyps, or hemorrhoids felt.  Hemoccult  negative. Extremities/musculoskeletal:  No swelling or varicosities noted, no clubbing or cyanosis Psych:  No mood changes, alert and cooperative,seems happy AA is 0 Fall risk is low    03/19/2023    9:25 AM 07/11/2021   10:49 AM 05/16/2021    9:17 AM  Depression screen PHQ 2/9  Decreased Interest 3 1 0  Down, Depressed, Hopeless 2 1 0  PHQ - 2 Score 5 2 0  Altered sleeping 3 3   Tired, decreased energy 2 1   Change in appetite 2 1   Feeling bad or failure about yourself  1 0   Trouble concentrating 1 0   Moving slowly or fidgety/restless 1 1   Suicidal thoughts 0 0   PHQ-9 Score 15 8    On meds from PCP    03/19/2023    9:25 AM 07/11/2021   10:51 AM 04/18/2021   12:20 PM  GAD 7 : Generalized Anxiety Score  Nervous, Anxious, on Edge 2 1 3   Control/stop worrying 2 1 3   Worry too much - different things 2 1 3   Trouble relaxing 2 1 1   Restless 2 0 1  Easily annoyed or irritable 2 1 3   Afraid - awful might happen 2 1 2   Total GAD 7 Score 14 6 16       Upstream - 03/19/23 0931       Pregnancy Intention Screening   Does the patient want to become pregnant in the next year? N/A    Does the patient's partner want to become pregnant  in the next year? N/A    Would the patient like to discuss contraceptive options today? N/A      Contraception Wrap Up   Current Method Female Sterilization   hyst   End Method Female Sterilization   hyst   Contraception Counseling Provided No            Examination chaperoned by Faith Rogue LPN   Impression and Plan: 1. Pain with urination UA C&S sent to rule out UTI - POCT Urinalysis Dipstick - Urine Culture - Urinalysis, Routine w reflex microscopic  2. Burning with urination UA C&S sent  - POCT Urinalysis Dipstick - Urine Culture - Urinalysis, Routine w reflex microscopic  3. Urinary frequency She is on ditropan, she liked myrbetriq better but was $90 UA C&S sent - POCT Urinalysis Dipstick - Urine Culture - Urinalysis,  Routine w reflex microscopic  4. Encounter for well woman exam with routine gynecological exam Physical in 1 year Mammogram was normal 05/07/22 Labs with PCP, will get me a copy she says   5. Encounter for screening fecal occult blood testing Hemoccult was negative   6. Tired   7. Short of breath on exertion She declines referral to cardiology   8. Hypertension, unspecified type Take BP meds and follow up with PCP  9. Cystocele, midline   10. Lichen sclerosus et atrophicus Has thin red tissue at introitus, will rx temovate Meds ordered this encounter  Medications   clobetasol ointment (TEMOVATE) 0.05 %    Sig: Use bid for 2 weeks then 2 x weekly    Dispense:  30 g    Refill:  3    Order Specific Question:   Supervising Provider    Answer:   Lazaro Arms [[id number]]    Will recheck in 3 months   11. Sleep disturbance Has xanax but it does not help Follow up with PCP

## 2023-03-20 LAB — URINALYSIS, ROUTINE W REFLEX MICROSCOPIC
Bilirubin, UA: NEGATIVE
Glucose, UA: NEGATIVE
Ketones, UA: NEGATIVE
Nitrite, UA: NEGATIVE
RBC, UA: NEGATIVE
Specific Gravity, UA: 1.018 (ref 1.005–1.030)
Urobilinogen, Ur: 0.2 mg/dL (ref 0.2–1.0)
pH, UA: 5.5 (ref 5.0–7.5)

## 2023-03-20 LAB — MICROSCOPIC EXAMINATION
Casts: NONE SEEN /lpf
Epithelial Cells (non renal): >10 /hpf — AB (ref 0–10)
RBC, Urine: NONE SEEN /hpf (ref 0–2)
WBC, UA: >30 /hpf — AB (ref 0–5)

## 2023-03-22 LAB — URINE CULTURE

## 2023-03-23 ENCOUNTER — Telehealth: Payer: Self-pay | Admitting: *Deleted

## 2023-03-23 ENCOUNTER — Other Ambulatory Visit: Payer: Self-pay | Admitting: Adult Health

## 2023-03-23 MED ORDER — SULFAMETHOXAZOLE-TRIMETHOPRIM 800-160 MG PO TABS
1.0000 | ORAL_TABLET | Freq: Two times a day (BID) | ORAL | 0 refills | Status: DC
Start: 2023-03-23 — End: 2023-07-19

## 2023-03-23 NOTE — Progress Notes (Signed)
+   E COLI on urine will rx septra ds

## 2023-03-23 NOTE — Telephone Encounter (Signed)
Pt aware urine culture was + for e.coli. Med was sent to pharmacy. Take until med is all gone and drink lots of water. Pt voiced understanding. JSY

## 2023-03-23 NOTE — Telephone Encounter (Signed)
-----   Message from Cyril Mourning sent at 03/23/2023 10:24 AM EDT ----- Let Fulton Mole know + ecoli on urine and rx sent in for septra ds THX

## 2023-04-06 ENCOUNTER — Other Ambulatory Visit (HOSPITAL_COMMUNITY): Payer: Self-pay | Admitting: Family Medicine

## 2023-04-06 DIAGNOSIS — Z23 Encounter for immunization: Secondary | ICD-10-CM | POA: Diagnosis not present

## 2023-04-06 DIAGNOSIS — Z1231 Encounter for screening mammogram for malignant neoplasm of breast: Secondary | ICD-10-CM

## 2023-04-06 DIAGNOSIS — N3281 Overactive bladder: Secondary | ICD-10-CM | POA: Diagnosis not present

## 2023-04-06 DIAGNOSIS — G9332 Myalgic encephalomyelitis/chronic fatigue syndrome: Secondary | ICD-10-CM | POA: Diagnosis not present

## 2023-04-06 DIAGNOSIS — N390 Urinary tract infection, site not specified: Secondary | ICD-10-CM | POA: Diagnosis not present

## 2023-04-06 DIAGNOSIS — Z6841 Body Mass Index (BMI) 40.0 and over, adult: Secondary | ICD-10-CM | POA: Diagnosis not present

## 2023-04-06 DIAGNOSIS — R32 Unspecified urinary incontinence: Secondary | ICD-10-CM | POA: Diagnosis not present

## 2023-04-06 DIAGNOSIS — F419 Anxiety disorder, unspecified: Secondary | ICD-10-CM | POA: Diagnosis not present

## 2023-05-10 ENCOUNTER — Ambulatory Visit (HOSPITAL_COMMUNITY)
Admission: RE | Admit: 2023-05-10 | Discharge: 2023-05-10 | Disposition: A | Discharge status: Home / Self Care | Payer: PPO | Source: Ambulatory Visit | Attending: Family Medicine | Admitting: Family Medicine

## 2023-05-10 DIAGNOSIS — Z1231 Encounter for screening mammogram for malignant neoplasm of breast: Secondary | ICD-10-CM | POA: Insufficient documentation

## 2023-06-19 ENCOUNTER — Other Ambulatory Visit: Payer: Self-pay | Admitting: Adult Health

## 2023-06-28 DIAGNOSIS — Z6839 Body mass index (BMI) 39.0-39.9, adult: Secondary | ICD-10-CM | POA: Diagnosis not present

## 2023-06-28 DIAGNOSIS — J069 Acute upper respiratory infection, unspecified: Secondary | ICD-10-CM | POA: Diagnosis not present

## 2023-06-28 DIAGNOSIS — F419 Anxiety disorder, unspecified: Secondary | ICD-10-CM | POA: Diagnosis not present

## 2023-06-28 DIAGNOSIS — N3281 Overactive bladder: Secondary | ICD-10-CM | POA: Diagnosis not present

## 2023-06-28 DIAGNOSIS — E6609 Other obesity due to excess calories: Secondary | ICD-10-CM | POA: Diagnosis not present

## 2023-06-28 DIAGNOSIS — N390 Urinary tract infection, site not specified: Secondary | ICD-10-CM | POA: Diagnosis not present

## 2023-06-29 DIAGNOSIS — N390 Urinary tract infection, site not specified: Secondary | ICD-10-CM | POA: Insufficient documentation

## 2023-06-29 DIAGNOSIS — Z8673 Personal history of transient ischemic attack (TIA), and cerebral infarction without residual deficits: Secondary | ICD-10-CM | POA: Insufficient documentation

## 2023-06-29 DIAGNOSIS — N3281 Overactive bladder: Secondary | ICD-10-CM | POA: Insufficient documentation

## 2023-06-29 NOTE — Progress Notes (Deleted)
 Name: Kristina Mcdonald DOB: March 15, 1956 MRN: [id number]  History of Present Illness: Kristina Mcdonald is a 67 y.o. female who presents today as a new patient at [facility] [place]. All available relevant medical records have been reviewed.  ***She is accompanied by ***. - GU History: 1. OAB with urinary frequency, nocturia, urgency, and urge incontinence. - Previously tried Myrbetriq  50 mg - worked well but expensive. - Taking Oxybutynin (Ditropan) 5 mg ***2x/day. 2. Cystocele. 3. Lichen sclerosus et atrophicus. Was prescribed Temovate  0.05% ointment by GYN provider (Dr. Signa) on 03/19/2023. 4. Fecal incontinence. - 10/04/2015: Anal ultrasound performed at [facility]. - 10/08/2015: Anorectal manometry performed at [facility]. - 2017: Did pelvic floor physical therapy for UUI & FI.  She reports chief complaint of recurrent UTls.  Urine culture results in past 12 months: - 11/25/2022: Positive for Klebsiella pneumoniae - 03/19/2023: Positive for E. Coli (MDRO) - 04/09/2023: Positive for Enterococcus faecalis  Urinary Symptoms: She reports *** UTl's in the last year. When present, UTI symptoms include ***dysuria, ***increased urinary urgency, ***frequency, ***. She reports that UTI symptoms do ***not seem to correlate with intercourse. She {Actions; denies-reports:120008} acute UTI symptoms today.  At baseline: She {Actions; denies-reports:120008} urinary urgency, frequency, dysuria, gross hematuria, hesitancy, straining to void, or sensations of incomplete emptying. She reports voiding *** times per day and *** times at night. She {Actions; denies-reports:120008} pushing on a bulge in order to empty her bladder.  She {Actions; denies-reports:120008} urge incontinence. She {Actions; denies-reports:120008} stress incontinence with ***cough/***laugh/***sneeze/***heavy lifting/***exercise. She {Actions; denies-reports:120008} enuresis. She leaks *** times per ***. Wears *** ***pads /  ***diapers per day. She states the ***SUI / ***UUI is predominant. This has been going on for {NUMBERS 1-12:18279} {days/wks/mos/yrs:310907} and {ACTION; IS/IS WNU:[id number]} significantly bothersome. In terms of treatment, She has tried ***.  She {Actions; denies-reports:120008} caffeine intake.  She {Actions; denies-reports:120008} history of pyelonephritis.  She {Actions; denies-reports:120008} history of kidney stones.  Vaginal / prolapse Symptoms: She {Actions; denies-reports:120008} vaginal bulge sensation.  She {Actions; denies-reports:120008} seeing a vaginal bulge. This bulge is bothersome and is the size of ***. It was first noticed ***.  She {Actions; denies-reports:120008} vaginal pain, bleeding, or discharge.  She {Actions; denies-reports:120008} use of topical vaginal estrogen cream.  Bowel Symptoms: She has a continent bowel movement *** time(s) per ***. Typical Bristol stool scale of continent bowel episodes is Type ***. She {Actions; denies-reports:120008} Fl episodes. Fecal incontinence started *** and occurs *** times per week. Typical Bristol stool scale of incontinent bowel episodes is Type ***. She {Actions; denies-reports:120008} straining and {Actions; denies-reports:120008} splinting to defecate. She {Actions; denies-reports:120008} rectal bleeding. She {Actions; denies-reports:120008} feeling like she fully empties her rectum with defecation. She {Actions; denies-reports:120008} urgency with defecation. She {Actions; denies-reports:120008} difficulty wiping clean. She {Actions; denies-reports:120008} taking laxatives, stool softeners, or fiber supplements. Last colonoscopy was***.  Past OB/GYN History: OB History     Gravida  2   Para  2   Term      Preterm      AB      Living  2      SAB      IAB      Ectopic      Multiple      Live Births  2          She {Actions; denies-reports:120008} being sexually active.  She {Actions;  denies-reports:120008} dyspareunia. ***Dyspareunia is located ***at the introitus / ***deep inside the vagina. She has *** child(ren) who were delivered ***vaginally ***  via c-section. She {Actions; denies-reports:120008} significant tearing with that ***delivery / those ***deliveries. She is {DESC; PRE/POST:32304} menopausal.  Last pap smear was ***. She {Actions; denies-reports:120008} history of *** hysterectomy.  Fall Screening: Do you usually have a device to assist in your mobility? {yes/no:20286} ***cane / ***walker / ***wheelchair  Medications: Current Outpatient Medications  Medication Sig Dispense Refill   sulfamethoxazole -trimethoprim  (BACTRIM  DS) 800-160 MG tablet Take 1 tablet by mouth 2 (two) times daily. Take 1 bid 14 tablet 0   ALPRAZolam  (XANAX ) 0.5 MG tablet Take 0.5 mg by mouth 4 (four) times daily as needed.     amLODipine  (NORVASC ) 5 MG tablet Take 1 tablet (5 mg total) by mouth daily. 30 tablet 0   aspirin  EC 81 MG tablet Take 81 mg by mouth daily.     atorvastatin  (LIPITOR) 80 MG tablet Take 80 mg by mouth daily.     cetirizine (ZYRTEC) 10 MG tablet Take 10 mg by mouth at bedtime.      clobetasol  ointment (TEMOVATE ) 0.05 % Use bid for 2 weeks then 2 x weekly 30 g 3   cyanocobalamin  (VITAMIN B12) 1000 MCG tablet Take 1,000 mcg by mouth daily.     dicyclomine  (BENTYL ) 20 MG tablet Take 20 mg by mouth 3 (three) times daily before meals.     ezetimibe  (ZETIA ) 10 MG tablet Take 10 mg by mouth daily.     levothyroxine  (SYNTHROID ) 100 MCG tablet Take 100 mcg by mouth daily.     mirtazapine  (REMERON ) 15 MG tablet Take 30 mg by mouth at bedtime.     MYRBETRIQ  50 MG TB24 tablet TAKE 1 TABLET BY MOUTH EVERY DAY (Patient not taking: Reported on 03/19/2023) 30 tablet 6   olmesartan -hydrochlorothiazide  (BENICAR  HCT) 20-12.5 MG tablet Take 1 tablet by mouth daily.     oxybutynin (DITROPAN) 5 MG tablet Take 5 mg by mouth 2 (two) times daily.     pantoprazole  (PROTONIX ) 40 MG tablet  Take 40 mg by mouth daily.     PARoxetine  (PAXIL ) 20 MG tablet TAKE 1 TABLET BY MOUTH EVERY DAY (Patient not taking: Reported on 03/19/2023) 90 tablet 1   triamcinolone  ointment (KENALOG ) 0.5 % APPLY TO AFFECTED AREA TWICE A DAY 30 g 3   No current facility-administered medications for this visit.    Allergies: No Known Allergies  Past Medical History:  Diagnosis Date   BV (bacterial vaginosis) 11/22/2013   CAD (coronary artery disease) 02/2001   Nonobstructive per cath.   Chronic anxiety    Hyperlipidemia    Hypertension    Hypothyroidism 10/2006   Status post thyroidectomy for thyroid  mass.   Insomnia 05/10/2013   Internal hemorrhoids 09/2003   LLQ pain 10/09/2014   Overactive bladder    Seasonal allergies    Stress 05/10/2013   Stroke (HCC)    Thrombocytopenia (HCC)    Chronic   Thrush, oral 11/22/2013   Tubular adenoma    Vaginal discharge 11/22/2013   Vaginal odor 10/09/2014   Past Surgical History:  Procedure Laterality Date   ABDOMINAL HYSTERECTOMY     APPENDECTOMY  11/2002.   Incidental during laparotomy and excision of ovarian remnant.   CARDIAC CATHETERIZATION  02/2001   Nonobstructive CAD   COLONOSCOPY  09/2003   Per Dr. Shaaron. Internal hemorrhoids.normal otherwise   COLONOSCOPY N/A 12/04/2013   Dr. Shaaron- normal rectum aside from minimal internal hemorrhoids. the patient had a 6mm polyp in the mid ascending segment and an 8mm poylp in the mid descending segment  o/w the remaider of the colonic mucosa appeared normal. the distal 10cm of terminal mucosa also appeared normal.tubular adenoma on bx   ESOPHAGOGASTRODUODENOSCOPY N/A 12/04/2013   Dr.Rourk- distal esophageal erosions w/i 5mm of the GE junction. no basrrett's esophagus. tubular esophagus patent throughout its course. focal antral erosions o/w normal appearing gastric mucosa. patent pylorus, normal first and second portion of the duodenum. mild chronic inflammation on bx   INNER EAR SURGERY     ear drum   LAPAROTOMY   11/2002   Excision of ovarian remnant   THYROIDECTOMY  10/2006   Neg for cancer per patient   TOTAL ABDOMINAL HYSTERECTOMY W/ BILATERAL SALPINGOOPHORECTOMY  09/2001.   Family History  Problem Relation Age of Onset   Heart disease Mother        CHF   Cancer Father        prostate   Stroke Brother    Stroke Sister    Heart disease Sister        CHF   Multiple sclerosis Sister    Stroke Daughter        light strokes   Heart attack Sister    Colon cancer Neg Hx    Social History   Socioeconomic History   Marital status: Married    Spouse name: Not on file   Number of children: Not on file   Years of education: Not on file   Highest education level: Not on file  Occupational History   Occupation: Administrator, Arts: [employer]  Tobacco Use   Smoking status: Never   Smokeless tobacco: Never   Tobacco comments:    Never smoker  Vaping Use   Vaping status: Never Used  Substance and Sexual Activity   Alcohol use: No   Drug use: No   Sexual activity: Not Currently    Birth control/protection: Surgical    Comment: hyst  Other Topics Concern   Not on file  Social History Narrative   Not on file   Social Drivers of Health   Financial Resource Strain: Low Risk  (03/19/2023)   Overall Financial Resource Strain (CARDIA)    Difficulty of Paying Living Expenses: Not hard at all  Food Insecurity: No Food Insecurity (03/19/2023)   Hunger Vital Sign    Worried About Running Out of Food in the Last Year: Never true    Ran Out of Food in the Last Year: Never true  Transportation Needs: No Transportation Needs (03/19/2023)   PRAPARE - Administrator, Civil Service (Medical): No    Lack of Transportation (Non-Medical): No  Physical Activity: Insufficiently Active (03/19/2023)   Exercise Vital Sign    Days of Exercise per Week: 1 day    Minutes of Exercise per Session: 10 min  Stress: Stress Concern Present (03/19/2023)   [employer] of  Occupational Health - Occupational Stress Questionnaire    Feeling of Stress : Very much  Social Connections: Moderately Integrated (03/19/2023)   Social Connection and Isolation Panel [NHANES]    Frequency of Communication with Friends and Family: More than three times a week    Frequency of Social Gatherings with Friends and Family: Once a week    Attends Religious Services: 1 to 4 times per year    Active Member of [employer] or Organizations: No    Attends Banker Meetings: Never    Marital Status: Married  Catering Manager Violence: Not At Risk (03/19/2023)   Humiliation, Afraid, Rape, and  Kick questionnaire    Fear of Current or Ex-Partner: No    Emotionally Abused: No    Physically Abused: No    Sexually Abused: No    SUBJECTIVE  Review of Systems Constitutional: Patient denies any unintentional weight loss or change in strength lntegumentary: Patient denies any rashes or pruritus Cardiovascular: Patient denies chest pain or syncope Respiratory: Patient denies shortness of breath Gastrointestinal: Patient ***denies nausea, vomiting, constipation, or diarrhea Musculoskeletal: Patient denies muscle cramps or weakness Neurologic: Patient denies convulsions or seizures Allergic/Immunologic: Patient denies recent allergic reaction(s) Hematologic/Lymphatic: Patient denies bleeding tendencies Endocrine: Patient denies heat/cold intolerance  GU: As per HPI.  OBJECTIVE There were no vitals filed for this visit. There is no height or weight on file to calculate BMI.  Physical Examination Constitutional: No obvious distress; patient is non-toxic appearing  Cardiovascular: No visible lower extremity edema.  Respiratory: The patient does not have audible wheezing/stridor; respirations do not appear labored  Gastrointestinal: Abdomen non-distended Musculoskeletal: Normal ROM of UEs  Skin: No obvious rashes/open sores  Neurologic: CN 2-12 grossly intact Psychiatric:  Answered questions appropriately with normal affect  Hematologic/Lymphatic/Immunologic: No obvious bruises or sites of spontaneous bleeding  Urine microscopy: ***negative *** WBC/hpf, *** RBC/hpf, *** bacteria UA: ***negative *** WBC/hpf, *** RBC/hpf, *** bacteria ***with no evidence of UTI ***with no evidence of microscopic hematuria ***otherwise unremarkable  PVR: *** ml  ASSESSMENT No diagnosis found.  ***Recurrent UTls / ***History of UTls with insufficient urine culture data to formally validate recurrent UTI diagnosis:  ***  Will plan for follow up in *** months or sooner if needed. Pt verbalized understanding and agreement. All questions were answered.  PLAN Advised the following: ***. Start topical vaginal estrogen cream as prescribed. ***. Maintain adequate fluid intake daily to flush out the urinary tract. ***. Urinate every 4-6 hours while awake to minimize urinary stasis / bacterial overgrowth in the bladder. ***. Consider OTC supplements for UTI prevention. ***. ***No follow-ups on file.  No orders of the defined types were placed in this encounter.   It has been explained that the patient is to follow regularly with their PCP in addition to all other providers involved in their care and to follow instructions provided by these respective offices. Patient advised to contact urology clinic if any urologic-pertaining questions, concerns, new symptoms or problems arise in the interim period.  There are no Patient Instructions on file for this visit.  Electronically signed by:  Lauraine KYM Oz, MSN, FNP-C, CUNP 06/29/2023 8:55 AM

## 2023-07-05 ENCOUNTER — Ambulatory Visit: Payer: PPO | Admitting: Urology

## 2023-07-05 DIAGNOSIS — N3941 Urge incontinence: Secondary | ICD-10-CM

## 2023-07-05 DIAGNOSIS — N390 Urinary tract infection, site not specified: Secondary | ICD-10-CM

## 2023-07-05 DIAGNOSIS — N8111 Cystocele, midline: Secondary | ICD-10-CM

## 2023-07-05 DIAGNOSIS — N3281 Overactive bladder: Secondary | ICD-10-CM

## 2023-07-05 DIAGNOSIS — L900 Lichen sclerosus et atrophicus: Secondary | ICD-10-CM

## 2023-07-07 IMAGING — DX DG CHEST 2V
2 series · 2 of 2 positions shown · non-contrast
Comparison: Chest x-ray 07/24/2015

CLINICAL DATA: Chest pain, weakness, hypertension

EXAM:
CHEST - 2 VIEW

[chest pa]
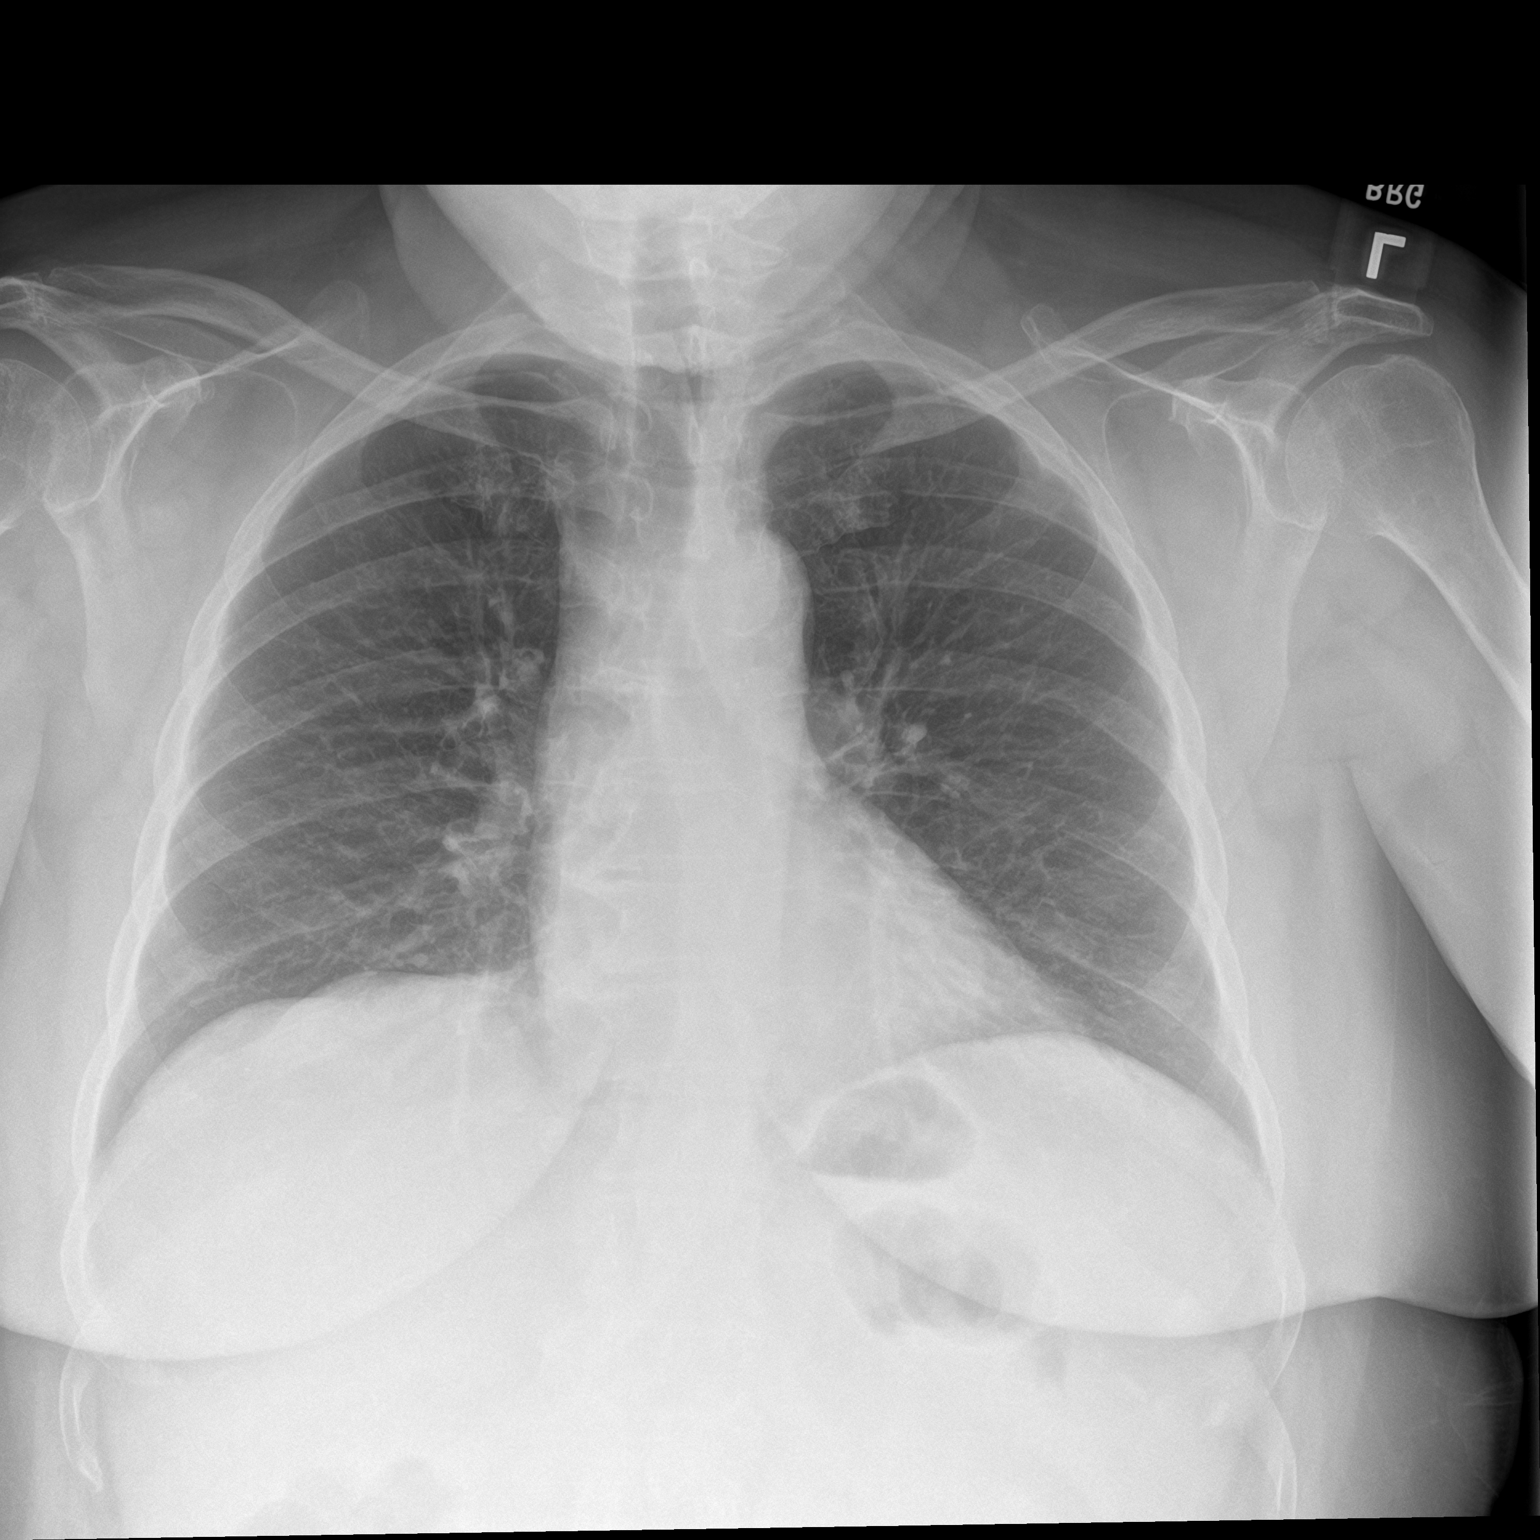

[chest lat]
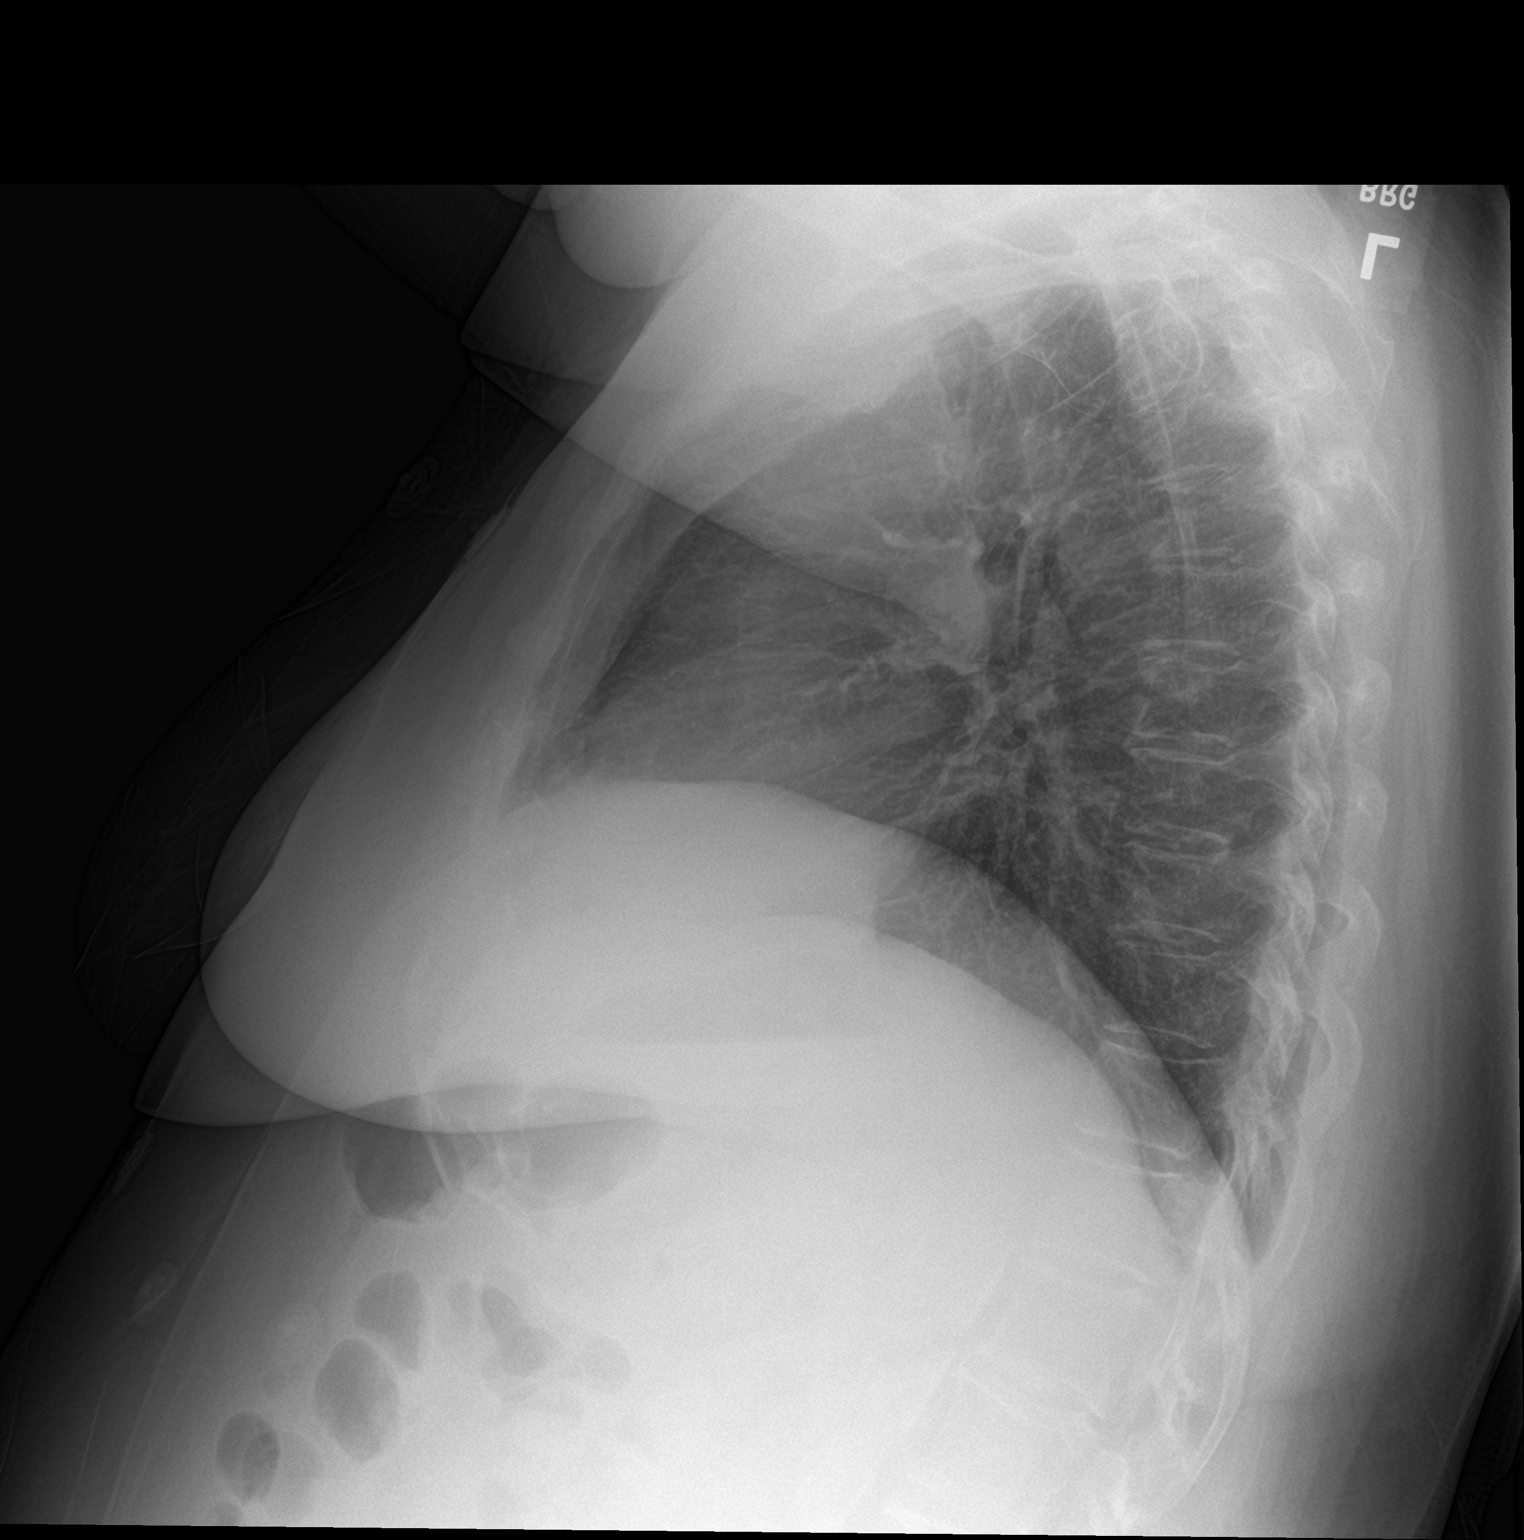

[2 of 2 positions shown; findings below may reference images not displayed]

FINDINGS: The heart and mediastinal contours are unchanged. Aortic
calcification

No focal consolidation. No pulmonary edema. No pleural effusion. No
pneumothorax.

No acute osseous abnormality.
IMPRESSION: No active cardiopulmonary disease.

## 2023-07-12 NOTE — Progress Notes (Signed)
Name: Kristina Mcdonald DOB: [birth year] MRN: [id number]  History of Present Illness: Ms. Kristina Mcdonald is a 68 y.o. female who presents today as a new patient at [facility] [place]. All available relevant medical records have been reviewed.  - GU History: 1. OAB with urinary frequency, nocturia, urgency, and urge incontinence. - Previously tried Myrbetriq 50 mg - worked well but expensive. - Taking Oxybutynin (Ditropan) 5 mg 2x/day for many years. 2. Cystocele. 3. Lichen sclerosus et atrophicus. Was prescribed Temovate 0.05% ointment by GYN provider (Dr. Valentina Lucks) on 03/19/2023. 4. Fecal incontinence. - 10/04/2015: Anal ultrasound performed at [facility]. - 10/08/2015: Anorectal manometry performed at [facility]. - 2017: Did pelvic floor physical therapy for UUI & FI.  She reports chief complaint of recurrent UTls.  Urine culture results in past 12 months: - 11/25/2022: Positive for Klebsiella pneumoniae - 03/19/2023: Positive for E. Coli (MDRO) - 04/09/2023: Positive for Enterococcus faecalis  Urinary Symptoms: She reports 8 UTl's in the last year. When present, UTI symptoms include nausea, increased urinary urgency, frequency. States that most of the times she has been treated for UTI she did not have dysuria.   She reports acute UTI symptoms today.  At baseline: She reports urinary urgency and frequency. Denies gross hematuria, hesitancy, straining to void, or sensations of incomplete emptying. She reports voiding 20-30 times per day and 2-3 times at night. She denies pushing on a bulge in order to empty her bladder.  She reports urge incontinence. She reports stress incontinence with cough; occasionally. She leaks multiple times per day. Wears 2 diapers per day, which she states are not fully saturated. She states the UUI is predominant. This has been going on for >1 year and is significantly bothersome.   She reports caffeine intake (1 cup of coffee in the mornings and 1 Dr. Reino Kent  in the evenings).  She denies history of pyelonephritis.  She denies history of kidney stones.  Vaginal / prolapse Symptoms: She denies bothersome vaginal bulge sensation but she can feel a bulge when she wipes. She reports seeing a vaginal bulge if she looks with a mirror. She denies vaginal pain, bleeding, or discharge.  Uses Clobetasol as needed when vaginal tissue feels irritated / burning.  She denies use of topical vaginal estrogen cream.  Bowel Symptoms: She reports history of alternating constipation and diarrhea with fecal seepage / incontinence. She continues to do the pelvic floor physical therapy exercises she was taught years ago and takes laxatives / stool softeners PRN.   Past OB/GYN History: OB History     Gravida  2   Para  2   Term      Preterm      AB      Living  2      SAB      IAB      Ectopic      Multiple      Live Births  2          She denies being sexually active.  She is post menopausal.  She reports history of TAH & BSO.  Fall Screening: Do you usually have a device to assist in your mobility? No   Medications: Current Outpatient Medications  Medication Sig Dispense Refill   ALPRAZolam (XANAX) 0.5 MG tablet Take 0.5 mg by mouth 4 (four) times daily as needed.     amLODipine (NORVASC) 5 MG tablet Take 1 tablet (5 mg total) by mouth daily. 30 tablet 0   Ascorbic Acid (  VITAMIN C) 100 MG tablet Take 100 mg by mouth daily.     aspirin EC 81 MG tablet Take 81 mg by mouth daily.     atorvastatin (LIPITOR) 80 MG tablet Take 80 mg by mouth daily.     buPROPion (WELLBUTRIN XL) 150 MG 24 hr tablet Take 150 mg by mouth daily.     cetirizine (ZYRTEC) 10 MG tablet Take 10 mg by mouth at bedtime.      cholecalciferol (VITAMIN D3) 25 MCG (1000 UNIT) tablet Take 1,000 Units by mouth daily.     clobetasol ointment (TEMOVATE) 0.05 % Use bid for 2 weeks then 2 x weekly 30 g 3   cyanocobalamin (VITAMIN B12) 1000 MCG tablet Take 1,000 mcg by  mouth daily.     dicyclomine (BENTYL) 20 MG tablet Take 20 mg by mouth 3 (three) times daily before meals.     escitalopram (LEXAPRO) 20 MG tablet Take 20 mg by mouth daily.     estradiol (ESTRACE) 0.1 MG/GM vaginal cream Discard plastic applicator. Insert a blueberry size amount (approximately 1 gram) of cream on fingertip inside vagina at bedtime every night for 1 week then every other night. For long term use. 30 g 3   ezetimibe (ZETIA) 10 MG tablet Take 10 mg by mouth daily.     levothyroxine (SYNTHROID) 100 MCG tablet Take 100 mcg by mouth daily.     mirtazapine (REMERON) 30 MG tablet Take 30 mg by mouth daily.     olmesartan-hydrochlorothiazide (BENICAR HCT) 20-12.5 MG tablet Take 1 tablet by mouth daily.     pantoprazole (PROTONIX) 40 MG tablet Take 40 mg by mouth daily.     trimethoprim (TRIMPEX) 100 MG tablet Take 1 tablet (100 mg total) by mouth daily. 30 tablet 5   Vibegron (GEMTESA) 75 MG TABS Take 1 tablet (75 mg total) by mouth daily.     vitamin E 200 UNIT capsule Take 200 Units by mouth daily.     triamcinolone ointment (KENALOG) 0.5 % APPLY TO AFFECTED AREA TWICE A DAY (Patient not taking: Reported on 07/19/2023) 30 g 3   No current facility-administered medications for this visit.    Allergies: No Known Allergies  Past Medical History:  Diagnosis Date   BV (bacterial vaginosis) 11/22/2013   CAD (coronary artery disease) 02/2001   Nonobstructive per cath.   Chronic anxiety    Hyperlipidemia    Hypertension    Hypothyroidism 10/2006   Status post thyroidectomy for thyroid mass.   Insomnia 05/10/2013   Internal hemorrhoids 09/2003   LLQ pain 10/09/2014   Overactive bladder    Seasonal allergies    Stress 05/10/2013   Stroke (HCC)    Thrombocytopenia (HCC)    Chronic   Thrush, oral 11/22/2013   Tubular adenoma    Vaginal discharge 11/22/2013   Vaginal odor 10/09/2014   Past Surgical History:  Procedure Laterality Date   ABDOMINAL HYSTERECTOMY     APPENDECTOMY   11/2002.   Incidental during laparotomy and excision of ovarian remnant.   CARDIAC CATHETERIZATION  02/2001   Nonobstructive CAD   COLONOSCOPY  09/2003   Per Dr. Jena Gauss. Internal hemorrhoids.normal otherwise   COLONOSCOPY N/A 12/04/2013   Dr. Jena Gauss- normal rectum aside from minimal internal hemorrhoids. the patient had a 6mm polyp in the mid ascending segment and an 8mm poylp in the mid descending segment o/w the remaider of the colonic mucosa appeared normal. the distal 10cm of terminal mucosa also appeared normal.tubular adenoma on bx  ESOPHAGOGASTRODUODENOSCOPY N/A 12/04/2013   Dr.Rourk- distal esophageal erosions w/i 5mm of the GE junction. no basrrett's esophagus. tubular esophagus patent throughout its course. focal antral erosions o/w normal appearing gastric mucosa. patent pylorus, normal first and second portion of the duodenum. mild chronic inflammation on bx   INNER EAR SURGERY     ear drum   LAPAROTOMY  11/2002   Excision of ovarian remnant   THYROIDECTOMY  10/2006   Neg for cancer per patient   TOTAL ABDOMINAL HYSTERECTOMY W/ BILATERAL SALPINGOOPHORECTOMY  09/2001.   Family History  Problem Relation Age of Onset   Heart disease Mother        CHF   Cancer Father        prostate   Stroke Brother    Stroke Sister    Heart disease Sister        CHF   Multiple sclerosis Sister    Stroke Daughter        light strokes   Heart attack Sister    Colon cancer Neg Hx    Social History   Socioeconomic History   Marital status: Married    Spouse name: Not on file   Number of children: Not on file   Years of education: Not on file   Highest education level: Not on file  Occupational History   Occupation: Administrator, arts: [employer]  Tobacco Use   Smoking status: Never   Smokeless tobacco: Never   Tobacco comments:    Never smoker  Vaping Use   Vaping status: Never Used  Substance and Sexual Activity   Alcohol use: No   Drug use: No   Sexual activity:  Not Currently    Birth control/protection: Surgical    Comment: hyst  Other Topics Concern   Not on file  Social History Narrative   Not on file   Social Drivers of Health   Financial Resource Strain: Low Risk  (03/19/2023)   Overall Financial Resource Strain (CARDIA)    Difficulty of Paying Living Expenses: Not hard at all  Food Insecurity: No Food Insecurity (03/19/2023)   Hunger Vital Sign    Worried About Running Out of Food in the Last Year: Never true    Ran Out of Food in the Last Year: Never true  Transportation Needs: No Transportation Needs (03/19/2023)   PRAPARE - Administrator, Civil Service (Medical): No    Lack of Transportation (Non-Medical): No  Physical Activity: Insufficiently Active (03/19/2023)   Exercise Vital Sign    Days of Exercise per Week: 1 day    Minutes of Exercise per Session: 10 min  Stress: Stress Concern Present (03/19/2023)   [employer] of Occupational Health - Occupational Stress Questionnaire    Feeling of Stress : Very much  Social Connections: Moderately Integrated (03/19/2023)   Social Connection and Isolation Panel [NHANES]    Frequency of Communication with Friends and Family: More than three times a week    Frequency of Social Gatherings with Friends and Family: Once a week    Attends Religious Services: 1 to 4 times per year    Active Member of [employer] or Organizations: No    Attends Banker Meetings: Never    Marital Status: Married  Catering manager Violence: Not At Risk (03/19/2023)   Humiliation, Afraid, Rape, and Kick questionnaire    Fear of Current or Ex-Partner: No    Emotionally Abused: No    Physically Abused: No  Sexually Abused: No    SUBJECTIVE  Review of Systems Constitutional: Patient denies any unintentional weight loss or change in strength lntegumentary: Patient denies any rashes or pruritus Cardiovascular: Patient denies chest pain or syncope Respiratory: Patient denies shortness  of breath Gastrointestinal: As per HPI Musculoskeletal: Patient denies muscle cramps or weakness Neurologic: Patient denies convulsions or seizures Allergic/Immunologic: Patient denies recent allergic reaction(s) Hematologic/Lymphatic: Patient denies bleeding tendencies Endocrine: Patient denies heat/cold intolerance  GU: As per HPI.  OBJECTIVE Vitals:   07/19/23 1128  BP: 138/80  Pulse: 90  Temp: 98.5 F (36.9 C)   There is no height or weight on file to calculate BMI.  Physical Examination Constitutional: No obvious distress; patient is non-toxic appearing  Cardiovascular: No visible lower extremity edema.  Respiratory: The patient does not have audible wheezing/stridor; respirations do not appear labored  Gastrointestinal: Abdomen non-distended Musculoskeletal: Normal ROM of UEs  Skin: No obvious rashes/open sores  Neurologic: CN 2-12 grossly intact Psychiatric: Answered questions appropriately with normal affect  Hematologic/Lymphatic/Immunologic: No obvious bruises or sites of spontaneous bleeding  Urine microscopy: negative  PVR: 0 ml  ASSESSMENT OAB (overactive bladder) - Plan: Urinalysis, Routine w reflex microscopic, BLADDER SCAN AMB NON-IMAGING, Vibegron (GEMTESA) 75 MG TABS  Recurrent UTI - Plan: Urinalysis, Routine w reflex microscopic, BLADDER SCAN AMB NON-IMAGING, estradiol (ESTRACE) 0.1 MG/GM vaginal cream, trimethoprim (TRIMPEX) 100 MG tablet  Urge incontinence of urine - Plan: Urinalysis, Routine w reflex microscopic, BLADDER SCAN AMB NON-IMAGING, Vibegron (GEMTESA) 75 MG TABS  Lichen sclerosus et atrophicus - Plan: Urinalysis, Routine w reflex microscopic, BLADDER SCAN AMB NON-IMAGING, estradiol (ESTRACE) 0.1 MG/GM vaginal cream  Urinary frequency  Recurrent UTls:  We reviewed UTI symptoms which may include: dysuria, acutely increased urinary urgency and/or frequency, fever, acute mental status change / confusion, general malaise, fatigue,  weakness.  We discussed the possible etiologies of recurrent UTls including ascending infection related to intercourse; vaginal atrophy; transmural infection that has been treated incompletely; urinary tract stones; incomplete bladder emptying with urinary stasis; kidney or bladder tumor; urethral diverticulum; and colonization of  vagina and urinary tract with pathologic, adherent organisms.   For UTI prevention we discussed the following options, for which detailed information has been included in Patient Information section of today's After Visit Summary. > Maintain adequate fluid intake daily to flush out the urinary tract. > Go to the bathroom to urinate every 4-6 hours while awake to minimize urinary stasis / bacterial overgrowth in the bladder. > Proanthocyanidin (PAC) supplement 36 mg daily; must be soluble (insoluble form of PAC will be ineffective). Recommend Ellura.  > D-mannose 2 g daily to minimize bacterial adherence in urinary tract > Vitamin C supplement to acidify urine to suppress bacterial growth > Probiotic to maintain healthy vaginal microbiome > Topical vaginal estrogen for vaginal atrophy.  We discussed the following: - The etiology and consequences of urogenital epithelial atrophy was explained to patient. The thinning of the epithelium of the urethra can contribute to urinary urgency and frequency syndromes. In addition, the normal bacterial flora that colonizes the perineum may contribute to UTI risk because the thin urethral epithelium allows the bacteria to become adherent and the change in vaginal pH can disrupt the vaginal / urethral microbiome and allow for bacterial overgrowth. - The normal bacterial flora that colonizes the perineum may contribute to UTI risk because the thin urethral epithelium allows the bacteria to become adherent and the change in vaginal pH can disrupt the vaginal / urethral microbiome and allow for  bacterial overgrowth.  - Topical vaginal  estrogen replacement will take about 3 months to restore the vaginal pH. - There have been studies that evaluate use of low-dose intravaginal estrogen cream that shows minimal systemic absorption that is negligible after 3 weeks. There have been no studies indicating that use of topical vaginal estrogen increases a patient's risk of contributing to breast cancer development or recurrence. > UTI prophylaxis with a daily low dose antibiotic. We discussed the potential risks of prolonged antibiotic treatment particularly with the risks of developing antibiotic resistance.   2. OAB with urinary frequency, nocturia, urgency, and urge incontinence. We discussed the symptoms of overactive bladder (OAB), which include urinary urgency, frequency, nocturia, with or without urge incontinence.   While we may not know the exact etiology of OAB, several risk factors can be identified.  - We discussed this patient's neurogenic risk factors for OAB-type symptoms including nicotine use and prior stroke.  - Likely exacerbated by diuretic use (hydrochlorothiazide) and caffeine intake.   We discussed the following management options in detail including potential benefits, risks, and side effects: Behavioral therapy: Decreasing bladder irritants (such as caffeine) Medication(s): - We agreed to discontinue Oxybutynin (Ditropan) due to her age and risk for side effects based on patient's age, comorbidities, and pre-existing dry mouth / dry eyes / constipation. - She did well on beta-3 agonist medication (Myrbetriq) previously but it was too expensive. Agreed to trial Gemtesa 75 mg daily with samples, which were provider.   3. Cystocele. Not significantly bothersome per patient. Advised ongoing routine follow up with GYN provider.  4. Lichen sclerosus et atrophicus. Was prescribed Temovate 0.05% ointment by GYN provider (Dr. Valentina Lucks) on 03/19/2023.  Will plan for follow up in 6 weeks or sooner if needed. Pt verbalized  understanding and agreement. All questions were answered.  PLAN Advised the following: 1. For recurrent UTIs:  - Topical vaginal estrogen cream every other night (long term). - Trimethoprim 100 mg daily. - Maintain adequate fluid intake daily to flush out the urinary tract. - Consider OTC supplements for UTI prevention (see handout). 2. For OAB with urinary frequency, nocturia, urgency, and urge incontinence: - Discontinue Oxybutynin (Ditropan) 5 mg.  - Trial of Gemtesa 75 mg daily (samples provided).  - Avoid / minimize caffeine intake (especially within 8 hours before bedtime). - Minimize evening fluid intake to reduce night time urination. 3. Lichen sclerosus et atrophicus.  - Clobetasol (Temovate) 0.05% ointment PRN as prescribed by GYN provider (Dr. Valentina Lucks). - Topical vaginal estrogen cream use as prescribed. 4. Return in about 6 weeks (around 08/30/2023) for UA, PVR, & f/u with Evette Georges NP.  Orders Placed This Encounter  Procedures   Urinalysis, Routine w reflex microscopic   BLADDER SCAN AMB NON-IMAGING   Total time spent caring for the patient today was over 45 minutes. This includes time spent on the date of the visit reviewing the patient's chart before the visit, time spent during the visit, and time spent after the visit on documentation. Over 50% of that time was spent in face-to-face time with this patient for direct counseling. E&M based on time and complexity of medical decision making.  It has been explained that the patient is to follow regularly with their PCP in addition to all other providers involved in their care and to follow instructions provided by these respective offices. Patient advised to contact urology clinic if any urologic-pertaining questions, concerns, new symptoms or problems arise in the interim period.  Patient Instructions  Plan: 1. For recurrent UTIs:  - Start topical vaginal estrogen cream as prescribed. Will address vaginal atrophy /  genitourinary syndrome of menopause (GSM). - Start daily low dose antibiotic for UTI prevention (Trimethoprim 100 mg daily). - Maintain adequate fluid intake daily to flush out the urinary tract. - Consider OTC supplements for UTI prevention (see handout). 2. 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  - Trial of Gemtesa 75 mg daily (samples provided).  - Avoid / minimize caffeine intake (especially within 8 hours before bedtime). - Minimize evening fluid intake to reduce night time urination. 3. Lichen sclerosus et atrophicus.  - Clobetasol (Temovate) 0.05% ointment PRN as prescribed by GYN provider (Dr. Valentina Lucks). - Topical vaginal estrogen cream use as prescribed.         UTI prevention / management:  Difference between Urinalysis (urine dipstick test) and Urine culture:  Urinalysis (urine dipstick test): A quick office test used as an indicator to determine whether or not further testing is necessary (such as a urine culture, urine microscopy, etc.) The urinalysis cannot differentiate a true bacterial UTI or give a definitive diagnosis for the findings.  Urine culture: May be performed based on the findings of a urinalysis to evaluate for UTI. Grows out on a petri dish for 48-72 hours. Provides important information about: whether or not bacterial growth is present and if so: what the predominant bacteria is which antibiotics will work best against that bacteria That information is important so that we can diagnose and treat patients appropriately as there are other conditions which may mimic UTls which must not be missed (such as cancer, interstitial cystitis, stones, etc.). Assists [place] with antibiotic stewardship to minimize patient's risk for developing antibiotic resistance (getting to a point where no antibiotics work anymore).  Options when UTI symptoms occur: 1. Call [facility] [place] and request  to speak with triage nurse (phone # [phone], select option 3). In accordance with clinic guidelines the nurse will determine next steps based on patient-reported symptoms, which may include: same-day lab visit to provide urine specimen, recommendation to schedule Urology office visit appointment for further evaluation, recommendation to proceed to ER, etc. 2. Call your Primary Care Provider (PCP) office to request urgent / same-day visit. Be sure to request for urine culture to be ordered and have results faxed to Urology (fax # [phone]).  3. Go to urgent care. Be sure to request for urine culture to be ordered and have results faxed to Urology (fax # [phone]).   For bladder pain/ burning with urination: - Can take over-the-counter Pyridium (phenazopyridine; commonly known under the "AZO" brand) for a few days as needed. Limit use to no more than 3 days consecutively due to risk for methemoglobinemia, liver function issues, and bone health damage with long term use of Pyridium. - Alternative: Prescription urinary analgesics (such as Uribel, Urogesic blue, Urelle, Uro-MP). Often expensive / poorly covered by insurance unfortunately.  Options / recommendations for UTI prevention: - Low dose antibiotic daily for UTI prophylaxis. - Topical vaginal estrogen for vaginal atrophy (aka Genitourinary Syndrome of Menopause (GSM)). - Adequate daily fluid intake to flush out the urinary tract. - Go to the bathroom to urinate every 4-6 hours while awake to minimize urinary stasis / bacterial overgrowth in the bladder. - Proanthocyanidin (PAC) supplement 36 mg daily; must be soluble (insoluble form of PAC will be ineffective). Recommended brand: Ellura. This is an over-the-counter supplement (often must be found/ purchased  online) supplement derived from cranberries with concentrated active component: Proanthocyanidin (PAC) 36 mg daily. Decreases bacterial adherence to bladder lining.  - D-mannose  powder (2 grams daily). This is an over-the-counter supplement which decreases bacterial adherence to bladder lining (it is a sugar that inhibits bacterial adherence to urothelial cells by binding to the pili of enteric bacteria). Take as per manufacturer recommendation. Can be used as an alternative or in addition to the concentrated cranberry supplement.  - Vitamin C supplement to acidify urine to minimize bacterial growth.  - Probiotic to maintain healthy vaginal microbiome to suppress bacteria at urethral opening. Brand recommendations: Darrold Junker (includes probiotic & D-mannose ), Feminine Balance (highest concentration of lactobacillus) or Hyperbiotic Pro 15.  Note for patients with diabetes:  - Be aware that D-mannose contains sugar.  Note for patients with interstitial cystitis (IC):  - Patients with IC should typically avoid cranberry/ PAC supplements and Vitamin C supplements due to their acidity, which may exacerbate IC-related bladder pain. - Symptoms of true bacterial UTI can overlap / mimic symptoms of an IC flare up. Antibiotic use is NOT indicated for IC flare ups. Urine culture needed prior to antibiotic treatment for IC patients. The goal is to minimize your risk for developing antibiotic-resistant bacteria.    Vaginal atrophy I Genitourinary syndrome of menopause (GSM):  What it is: Changes in the vaginal environment (including the vulva and urethra) including: Thinning of the epithelium (skin/ mucosa surface) Can contribute to urinary urgency and frequency Can contribute to dryness, itching, irritation of the vulvar and vaginal tissue Can contribute to pain with intercourse Can contribute to physical changes of the labia, vulva, and vagina such as: Narrowing of the vaginal opening Decreased vaginal length Loss of labial architecture Labial adhesions Pale color of vulvovaginal tissue  Loss of pubic hair Allows bacteria to become adherent  Results in increased risk for  urinary tract infection (UTI) due to bacterial overgrowth and migration up the urethra into the bladder Change in vaginal pH (acid/ base balance) Allows for alteration / disruption of the normal bacterial flora / microbiome Results in increased risk for urinary tract infection (UTI) due to bacterial overgrowth  Treatment options: Over-the-counter lubricants (see list below). Prescription vaginal estrogen replacement. Options: Topical vaginal estrogen cream Estrace, Premarin, or compounded estradiol cream/ gel We advise: Discard plastic applicator as that tends to use more medication than you need, which is not harmful but wastes / uses up the medication. Also the plastic applicator may cause discomfort. Insert blueberry size amount of medication via the tip of your finger inside vagina nightly for 1 week then 2-3 times per week (long term). Estring vaginal ring Exchanged every 3 months (either at home or in office by provider) Vagifem vaginal tablet Inserted nightly for 2 weeks then twice a week (long term) lntrarosa vaginal suppository Vaginal DHEA: converts to estrogen in vaginal tissue without systemic effect Inserted nightly (long term) Vaginal laser therapy (Mona Lisa touch) Performed in 3 treatments each 6 weeks apart (available in our [place] office). Can feel like a sunburn for 3-4 days after each treatment until new skin heals in. Usually not covered by insurance. Estimated cost is $1500 for all 3 sessions.  FYI regarding prescription vaginal estrogen treatment options: All topical vaginal estrogen replacement options are equivalent in terms of efficacy. Topical vaginal estrogen replacement will take about 3 months to be effective. OK to have sex with any of the topical vaginal estrogen replacement options. Topical vaginal estrogen replacement may sting/burn initially due  to severe dryness, which will improve with ongoing treatment. There have been studies that evaluate use of  low-dose intravaginal estrogen that show minimal systemic absorption which is negligible after 3 weeks. There have been no studies indicating increased risk of contributing to cancer development or recurrence.  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 Treatment Comments I Available Products   Lubricants   Water-based Ingredients: Deionized water, glycerin, propylene glycol; latex safe; rare irritation; dry out with extended sexual activity Astroglide, Good Clean Love, K-Y Jelly, Natural, Organic, Pink, Sliquid, Sylk, Yes    Oil Based Ingredients: avocado, olive, peanut, corn; latex safe; can be used with silicone products; staining; safe (unless peanut allergy); non-irritating Coconut oil, vegetable oil, vitamin E oil  Silicone-Based Ingredients: Silicone polymers; staining; typically nonirritating, long lasting; waterproof; should not be used with silicone dilators, sexual toys, or gynecologic products Astroglide X, Oceanus Ultra Pure, Pink Silicone, Pjur Eros, Replens Silky Smooth, Silicone Premium JO, SKYN, Uberlube, [employer] Based Minimize harm to sperm motility; designed Astroglide TTC, Conceive Plus, Pre for couples trying to conceive Seed, Yes Baby  Fertility Friendly Minimize harm to sperm motility; designed Astroglide, TTC, Conceive Plus, Pre for couples trying to conceive Seed, Yes Baby  Vaginal Moisturizers   Vaginal Moisturizers For maintenance use 1 to 3 times weekly; can benefit  women with dryness, chafing with AOL, and recurrent vaginal infections irrespective of sexual activity timing Balance Active Menopause Vaginal Moisturizing Lubricant, Canesintima Intimate Moisturizer, Replens, Rephresh, Sylk Natural Intimate Moisturizer, Yes Vaginal Moisturizer  Hybrids Properties of both water and silicone-based products (combination of a vaginal lubricant and moisturizer); Non-irritating; good option for women with allergies and sensitivities Lubrigyn, Luvena  Suppositories Hyaluronic acid to retain moisture Revaree  Vulvar Soothing Creams/Oils    Medicated CreamsP ain and burn relief; Ingredients: 4% Lidocaine, Aloe Vera gel Releveum ([place])  Non-Medicated Creams For anti-itch and moisture/maintenance; Ingredients: Coconut oil, Avocado oil, Shea Butter, Olive oil, Vitamin E Vajuvenate, Vmagic  Oils !For moisture/maintenance !Coconut oil, Vitamin E oil, Emu oil            Overactive bladder (OAB) overview for patients:  Symptoms may include: urinary urgency ("gotta go" feeling) urinary frequency (voiding >8 times per day) night time urination (nocturia) urge incontinence of urine (UUI)  While we do not know the exact etiology of OAB, several treatment options exist including:  Behavioral therapy: Reducing fluid intake Decreasing bladder stimulants (such as caffeine) and irritants (such as acidic food, spicy foods, alcohol) Urge suppression strategies Bladder retraining via timed voiding  Pelvic floor physical therapy  Medication(s) - can use one or both of the drug classes below. Anticholinergic / antimuscarinic medications:  Mechanism of action: Activate M3 receptors to reduce detrusor stimulation and increase bladder capacity   (parasympathetic nervous system). Effect: Relaxes the bladder to decrease overactivity, increase bladder storage capacity, and increase time between voids. Onset: Slow acting (may take 8-12 weeks to determine  efficacy). Medications include: Vesicare (Solifenacin), Ditropan (Oxybutynin), Detrol (Tolterodine), Toviaz (Fesoterodine), Sanctura (Trospium), Urispas (Flavoxate), Enablex (Darifenacin), Bentyl (Dicyclomine), Levsin (Hyoscyamine ). Potential side effects include but are not limited to: Dry eyes, dry mouth, constipation, cognitive impairment, dementia risk with long term use, and urinary retention/ incomplete bladder emptying. Insurance companies generally prefer for patients to try 1-2 anticholinergic / antimuscarinic medications  first due to low cost. Some exceptions are made based on patient-specific comorbidities / risk factors. Beta-3 agonist medications: Mechanism of action: Stimulates selective B3 adrenergic receptors to cause smooth muscle bladder relaxation (sympathetic nervous system). Effect: Relaxes the bladder to decrease overactivity, increase bladder storage capacity, and increase time between voids. Onset: Slow acting (may take 8-12 weeks to determine efficacy). Medications include: Myrbetriq (Mirabegron) and Vibegron Leslye Peer). Potential side effects include but are not limited to: urinary retention / incomplete bladder emptying and elevated blood pressure (more likely to occur in individuals with pre-existing uncontrolled hypertension). These medications tend to be more expensive than the anticholinergic / antimuscarinic medications.   For patients with refractory OAB (if the above treatment options have been unsuccessful): Posterior tibial nerve stimulation (PTNS). Small acupuncture-type needle inserted near ankle with electric current to stimulate bladder via posterior tibial nerve pathway. Initially requires 12 weekly in-office treatments lasting 30 minutes each; followed by monthly in-office treatments lasting 30 minutes each for 1 year.  Bladder Botox injections. How it is done: Typically done via in-office cystoscopy; sometimes done in the OR depending on the situation. The  bladder is numbed with lidocaine instilled via a catheter. Then the urologist injects Botox into the bladder muscle wall in about 20 locations. Causes local paralysis of the bladder muscle at the injection sites to reduce bladder muscle overactivity / spasms. The effect lasts for approximately 6 months and cannot be reversed once performed. Risks may included but are not limited to: infection, incomplete bladder emptying/ urinary retention, short term need for self-catheterization or indwelling catheter, and need for repeat therapy. There is a 5-12% chance of needing to catheterize with Botox - that usually resolves in a few months as the Botox wears off. Typically Botox injections would need to be repeated every 3-12 months since this is not a permanent therapy.  Sacral neuromodulation trial (Medtronic lnterStim or Axonics implant). Sacral neuromodulation is FDA-approved for uncontrolled urinary urgency, urinary frequency, urinary urge incontinence, non-obstructive urinary retention, or fecal incontinence. It is not FDA-approved as a treatment for pain. The goal of this therapy is at least a 50% improvement in symptoms. It is NOT realistic to expect a 100% cure. This is a a 2-step outpatient procedure. After a successful test period, a permanent wire and generator are placed in the OR. We discussed the risk of infection. We reviewed the fact that about 30% of patients fail the test phase and are not candidates for permanent generator placement. During the 1-2 week trial phase, symptoms are documented by the patient to determine response. If patient gets at least a 50% improvement in symptoms, they may then proceed with Step 2. Step 1: Trial lead placement. Per physician discretion, may done one of two ways: Percutaneous nerve evaluation (PNE) in the [facility] urology office. Performed by urologist under local anesthesia (numbing the area with lidocaine) using a spinal needle for placement of test wire, which  usually stays in place for 5-7 days to determine therapy response. Test lead placement in OR under anesthesia. Usually stays in place 2 weeks to determine therapy response. > Step 2: Permanent implantation of sacral neuromodulation device, which is performed in the OR.  Sacral neuromodulation implants: All are conditionally MRI safe. Manufacturer: Medtronic Website: [email or web address][email or web address]/right-for-you.[email or web address] Options: lnterStim X: Non-rechargeable. The battery lasts 10 years on average. lnterStim Micro: Rechargeable. The battery lasts 15 years on average and must be charged routinely. Approximately 50% smaller implant than lnterStim X implant.  Manufacturer: Axonics Website: Findrealrelief.axonics.com Options: Non-rechargeable (  Axonics F15): The battery lasts 15 years on average. Rechargeable (Axonics R20): The battery lasts 20 years on average and must be charged in office for about 1 hour every 6-10 months on average. Approximately 50% smaller implant than Axonics non-rechargeable implant.  Note: Generally the rechargeable devices are only advised for very small or thin patients who may not have sufficient adipose tissue to comfortably overlay the implanted device.  Suprapubic catheter (SP tube) placement. Only done in severely refractory OAB when all other options have failed or are not a viable treatment choice depending on patient factors. Involves placement of a catheter through the lower abdomen into the bladder to continuously drain the bladder into an external collection bag, which patient can then empty at their convenience every few hours. Done via an outpatient surgical procedure in the OR under anesthesia. Risks may included but are not limited to: surgical site pain, infections, skin irritation / breakdown, chronic bacteriuria, symptomatic UTls. The SP tube must stay in place continuously. This is a  reversible procedure however - the insertion site will close if catheter is removed for more than a few hours. The SP tube must be exchanged routinely every 4 weeks to prevent the catheter from becoming clogged with sediment. SP tube exchanges are typically performed at a urology nurse visit or by a home health nurse.         Electronically signed by:  Donnita Falls, MSN, FNP-C, CUNP 07/19/2023 12:30 PM

## 2023-07-19 ENCOUNTER — Encounter: Payer: Self-pay | Admitting: Urology

## 2023-07-19 ENCOUNTER — Ambulatory Visit: Payer: PPO | Admitting: Urology

## 2023-07-19 VITALS — BP 138/80 | HR 90 | Temp 98.5°F

## 2023-07-19 DIAGNOSIS — N3941 Urge incontinence: Secondary | ICD-10-CM

## 2023-07-19 DIAGNOSIS — N3281 Overactive bladder: Secondary | ICD-10-CM

## 2023-07-19 DIAGNOSIS — R350 Frequency of micturition: Secondary | ICD-10-CM | POA: Diagnosis not present

## 2023-07-19 DIAGNOSIS — Z87440 Personal history of urinary (tract) infections: Secondary | ICD-10-CM | POA: Diagnosis not present

## 2023-07-19 DIAGNOSIS — L900 Lichen sclerosus et atrophicus: Secondary | ICD-10-CM

## 2023-07-19 DIAGNOSIS — N390 Urinary tract infection, site not specified: Secondary | ICD-10-CM

## 2023-07-19 LAB — URINALYSIS, ROUTINE W REFLEX MICROSCOPIC
Bilirubin, UA: NEGATIVE
Glucose, UA: NEGATIVE
Ketones, UA: NEGATIVE
Nitrite, UA: NEGATIVE
Protein,UA: NEGATIVE
RBC, UA: NEGATIVE
Specific Gravity, UA: 1.02 (ref 1.005–1.030)
Urobilinogen, Ur: 0.2 mg/dL (ref 0.2–1.0)
pH, UA: 7 (ref 5.0–7.5)

## 2023-07-19 LAB — BLADDER SCAN AMB NON-IMAGING: Scan Result: 0

## 2023-07-19 LAB — MICROSCOPIC EXAMINATION
Bacteria, UA: NONE SEEN
RBC, Urine: NONE SEEN /[HPF] (ref 0–2)

## 2023-07-19 MED ORDER — ESTRADIOL 0.1 MG/GM VA CREA: TOPICAL_CREAM | VAGINAL | 3 refills | Status: DC

## 2023-07-19 MED ORDER — GEMTESA 75 MG PO TABS: 1.0000 | ORAL_TABLET | Freq: Every day | ORAL | Status: DC

## 2023-07-19 MED ORDER — TRIMETHOPRIM 100 MG PO TABS: 100.0000 mg | ORAL_TABLET | Freq: Every day | ORAL | 5 refills | Status: DC

## 2023-07-19 NOTE — Patient Instructions (Addendum)
Plan: 1. For recurrent UTIs:  - Start topical vaginal estrogen cream as prescribed. 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 - Start daily low dose antibiotic for UTI prevention (Trimethoprim 100 mg daily). - Maintain adequate fluid intake daily to flush out the urinary tract. - Consider OTC supplements for UTI prevention (see handout). 2. 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  - Trial of Gemtesa 75 mg daily (samples provided).  - Avoid / minimize caffeine intake (especially within 8 hours before bedtime). - Minimize evening fluid intake to reduce night time urination. 3. Lichen sclerosus et atrophicus.  - Clobetasol (Temovate) 0.05% ointment PRN as prescribed by GYN provider (Dr. Valentina Lucks). - Topical vaginal estrogen cream use as prescribed.         UTI prevention / management:  Difference between Urinalysis (urine dipstick test) and Urine culture:  Urinalysis (urine dipstick test): A quick office test used as an indicator to determine whether or not further testing is necessary (such as a urine culture, urine microscopy, etc.) The urinalysis cannot differentiate a true bacterial UTI or give a definitive diagnosis for the findings.  Urine culture: May be performed based on the findings of a urinalysis to evaluate for UTI. Grows out on a petri dish for 48-72 hours. Provides important information about: whether or not bacterial growth is present and if so: what the predominant bacteria is which antibiotics will work best against that bacteria That information is important so that we can diagnose and treat patients appropriately as there are other conditions which may mimic UTls which must not be missed (such as cancer, interstitial cystitis, stones, etc.). Assists [place] with antibiotic stewardship to minimize patient's risk for developing antibiotic resistance (getting to a point where  no antibiotics work anymore).  Options when UTI symptoms occur: 1. Call [facility] and request to speak with triage nurse (phone # [phone], select option 3). In accordance with clinic guidelines the nurse will determine next steps based on patient-reported symptoms, which may include: same-day lab visit to provide urine specimen, recommendation to schedule Urology office visit appointment for further evaluation, recommendation to proceed to ER, etc. 2. Call your Primary Care Provider (PCP) office to request urgent / same-day visit. Be sure to request for urine culture to be ordered and have results faxed to Urology (fax # [phone]).  3. Go to urgent care. Be sure to request for urine culture to be ordered and have results faxed to Urology (fax # [phone]).   For bladder pain/ burning with urination: - Can take over-the-counter Pyridium (phenazopyridine; commonly known under the "AZO" brand) for a few days as needed. Limit use to no more than 3 days consecutively due to risk for methemoglobinemia, liver function issues, and bone health damage with long term use of Pyridium. - Alternative: Prescription urinary analgesics (such as Uribel, Urogesic blue, Urelle, Uro-MP). Often expensive / poorly covered by insurance unfortunately.  Options / recommendations for UTI prevention: - Low dose antibiotic daily for UTI prophylaxis. - Topical vaginal estrogen for vaginal atrophy (aka Genitourinary Syndrome of Menopause (GSM)). - Adequate daily fluid intake to flush out the urinary tract. - Go to the bathroom to urinate every 4-6 hours while awake to minimize urinary stasis / bacterial overgrowth in the bladder. - Proanthocyanidin (PAC) supplement 36 mg daily; must be soluble (insoluble form of PAC will be ineffective). Recommended brand: Ellura. This is an over-the-counter supplement (often must be found/ purchased online)  supplement derived from cranberries with concentrated  active component: Proanthocyanidin (PAC) 36 mg daily. Decreases bacterial adherence to bladder lining.  - D-mannose powder (2 grams daily). This is an over-the-counter supplement which decreases bacterial adherence to bladder lining (it is a sugar that inhibits bacterial adherence to urothelial cells by binding to the pili of enteric bacteria). Take as per manufacturer recommendation. Can be used as an alternative or in addition to the concentrated cranberry supplement.  - Vitamin C supplement to acidify urine to minimize bacterial growth.  - Probiotic to maintain healthy vaginal microbiome to suppress bacteria at urethral opening. Brand recommendations: Darrold Junker (includes probiotic & D-mannose ), Feminine Balance (highest concentration of lactobacillus) or Hyperbiotic Pro 15.  Note for patients with diabetes:  - Be aware that D-mannose contains sugar.  Note for patients with interstitial cystitis (IC):  - Patients with IC should typically avoid cranberry/ PAC supplements and Vitamin C supplements due to their acidity, which may exacerbate IC-related bladder pain. - Symptoms of true bacterial UTI can overlap / mimic symptoms of an IC flare up. Antibiotic use is NOT indicated for IC flare ups. Urine culture needed prior to antibiotic treatment for IC patients. The goal is to minimize your risk for developing antibiotic-resistant bacteria.    Vaginal atrophy I Genitourinary syndrome of menopause (GSM):  What it is: Changes in the vaginal environment (including the vulva and urethra) including: Thinning of the epithelium (skin/ mucosa surface) Can contribute to urinary urgency and frequency Can contribute to dryness, itching, irritation of the vulvar and vaginal tissue Can contribute to pain with intercourse Can contribute to physical changes of the labia, vulva, and vagina such as: Narrowing of the vaginal opening Decreased vaginal length Loss of labial architecture Labial adhesions Pale color  of vulvovaginal tissue Loss of pubic hair Allows bacteria to become adherent  Results in increased risk for urinary tract infection (UTI) due to bacterial overgrowth and migration up the urethra into the bladder Change in vaginal pH (acid/ base balance) Allows for alteration / disruption of the normal bacterial flora / microbiome Results in increased risk for urinary tract infection (UTI) due to bacterial overgrowth  Treatment options: Over-the-counter lubricants (see list below). Prescription vaginal estrogen replacement. Options: Topical vaginal estrogen cream Estrace, Premarin, or compounded estradiol cream/ gel We advise: Discard plastic applicator as that tends to use more medication than you need, which is not harmful but wastes / uses up the medication. Also the plastic applicator may cause discomfort. Insert blueberry size amount of medication via the tip of your finger inside vagina nightly for 1 week then 2-3 times per week (long term). Estring vaginal ring Exchanged every 3 months (either at home or in office by provider) Vagifem vaginal tablet Inserted nightly for 2 weeks then twice a week (long term) lntrarosa vaginal suppository Vaginal DHEA: converts to estrogen in vaginal tissue without systemic effect Inserted nightly (long term) Vaginal laser therapy (Mona Lisa touch) Performed in 3 treatments each 6 weeks apart (available in our [place] office). Can feel like a sunburn for 3-4 days after each treatment until new skin heals in. Usually not covered by insurance. Estimated cost is $1500 for all 3 sessions.  FYI regarding prescription vaginal estrogen treatment options: All topical vaginal estrogen replacement options are equivalent in terms of efficacy. Topical vaginal estrogen replacement will take about 3 months to be effective. OK to have sex with any of the topical vaginal estrogen replacement options. Topical vaginal estrogen replacement may sting/burn initially  due to  severe dryness, which will improve with ongoing treatment. There have been studies that evaluate use of low-dose intravaginal estrogen that show minimal systemic absorption which is negligible after 3 weeks. There have been no studies indicating increased risk of contributing to cancer development or recurrence.  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 Treatment Comments I Available Products   Lubricants   Water-based Ingredients: Deionized water, glycerin, propylene glycol; latex safe; rare irritation; dry out with extended sexual activity Astroglide, Good Clean Love, K-Y Jelly, Natural, Organic, Pink, Sliquid, Sylk, Yes    Oil Based Ingredients: avocado, olive, peanut, corn; latex safe; can be used with silicone products; staining; safe (unless peanut allergy); non-irritating Coconut oil, vegetable oil, vitamin E oil  Silicone-Based Ingredients: Silicone polymers; staining; typically nonirritating, long lasting; waterproof; should not be used with silicone dilators, sexual toys, or gynecologic products Astroglide X, Oceanus Ultra Pure, Pink Silicone, Pjur Eros, Replens Silky Smooth, Silicone Premium JO, SKYN, Uberlube, [employer] Based Minimize harm to sperm motility; designed Astroglide TTC, Conceive Plus, Pre for couples trying to conceive Seed, Yes Baby  Fertility Friendly Minimize harm to sperm motility; designed Astroglide, TTC, Conceive Plus, Pre for couples trying to conceive Seed, Yes  Baby  Vaginal Moisturizers   Vaginal Moisturizers For maintenance use 1 to 3 times weekly; can benefit women with dryness, chafing with AOL, and recurrent vaginal infections irrespective of sexual activity timing Balance Active Menopause Vaginal Moisturizing Lubricant, Canesintima Intimate Moisturizer, Replens, Rephresh, Sylk Natural Intimate Moisturizer, Yes Vaginal Moisturizer  Hybrids Properties of both water and silicone-based products (combination of a vaginal lubricant and moisturizer); Non-irritating; good option for women with allergies and sensitivities Lubrigyn, Luvena  Suppositories Hyaluronic acid to retain moisture Revaree  Vulvar Soothing Creams/Oils    Medicated CreamsP ain and burn relief; Ingredients: 4% Lidocaine, Aloe Vera gel Releveum ([place])  Non-Medicated Creams For anti-itch and moisture/maintenance; Ingredients: Coconut oil, Avocado oil, Shea Butter, Olive oil, Vitamin E Vajuvenate, Vmagic  Oils !For moisture/maintenance !Coconut oil, Vitamin E oil, Emu oil            Overactive bladder (OAB) overview for patients:  Symptoms may include: urinary urgency ("gotta go" feeling) urinary frequency (voiding >8 times per day) night time urination (nocturia) urge incontinence of urine (UUI)  While we do not know the exact etiology of OAB, several treatment options exist including:  Behavioral therapy: Reducing fluid intake Decreasing bladder stimulants (such as caffeine) and irritants (such as acidic food, spicy foods, alcohol) Urge suppression strategies Bladder retraining via timed voiding  Pelvic floor physical therapy  Medication(s) - can use one or both of the drug classes below. Anticholinergic / antimuscarinic medications:  Mechanism of action: Activate M3 receptors to reduce detrusor stimulation and increase bladder capacity  (parasympathetic nervous system). Effect: Relaxes the bladder to decrease overactivity, increase bladder  storage capacity, and increase time between voids. Onset: Slow acting (may take 8-12 weeks to determine efficacy). Medications include: Vesicare (Solifenacin), Ditropan (Oxybutynin), Detrol (Tolterodine), Toviaz (Fesoterodine), Sanctura (Trospium), Urispas (Flavoxate), Enablex (Darifenacin), Bentyl (Dicyclomine), Levsin (Hyoscyamine ). Potential side effects include but are not limited to: Dry eyes, dry mouth, constipation, cognitive impairment, dementia risk with long term use, and urinary retention/ incomplete bladder emptying. Insurance companies generally prefer for patients to try 1-2 anticholinergic / antimuscarinic medications first due to  low cost. Some exceptions are made based on patient-specific comorbidities / risk factors. Beta-3 agonist medications: Mechanism of action: Stimulates selective B3 adrenergic receptors to cause smooth muscle bladder relaxation (sympathetic nervous system). Effect: Relaxes the bladder to decrease overactivity, increase bladder storage capacity, and increase time between voids. Onset: Slow acting (may take 8-12 weeks to determine efficacy). Medications include: Myrbetriq (Mirabegron) and Vibegron Leslye Peer). Potential side effects include but are not limited to: urinary retention / incomplete bladder emptying and elevated blood pressure (more likely to occur in individuals with pre-existing uncontrolled hypertension). These medications tend to be more expensive than the anticholinergic / antimuscarinic medications.   For patients with refractory OAB (if the above treatment options have been unsuccessful): Posterior tibial nerve stimulation (PTNS). Small acupuncture-type needle inserted near ankle with electric current to stimulate bladder via posterior tibial nerve pathway. Initially requires 12 weekly in-office treatments lasting 30 minutes each; followed by monthly in-office treatments lasting 30 minutes each for 1 year.  Bladder Botox injections. How it is  done: Typically done via in-office cystoscopy; sometimes done in the OR depending on the situation. The bladder is numbed with lidocaine instilled via a catheter. Then the urologist injects Botox into the bladder muscle wall in about 20 locations. Causes local paralysis of the bladder muscle at the injection sites to reduce bladder muscle overactivity / spasms. The effect lasts for approximately 6 months and cannot be reversed once performed. Risks may included but are not limited to: infection, incomplete bladder emptying/ urinary retention, short term need for self-catheterization or indwelling catheter, and need for repeat therapy. There is a 5-12% chance of needing to catheterize with Botox - that usually resolves in a few months as the Botox wears off. Typically Botox injections would need to be repeated every 3-12 months since this is not a permanent therapy.  Sacral neuromodulation trial (Medtronic lnterStim or Axonics implant). Sacral neuromodulation is FDA-approved for uncontrolled urinary urgency, urinary frequency, urinary urge incontinence, non-obstructive urinary retention, or fecal incontinence. It is not FDA-approved as a treatment for pain. The goal of this therapy is at least a 50% improvement in symptoms. It is NOT realistic to expect a 100% cure. This is a a 2-step outpatient procedure. After a successful test period, a permanent wire and generator are placed in the OR. We discussed the risk of infection. We reviewed the fact that about 30% of patients fail the test phase and are not candidates for permanent generator placement. During the 1-2 week trial phase, symptoms are documented by the patient to determine response. If patient gets at least a 50% improvement in symptoms, they may then proceed with Step 2. Step 1: Trial lead placement. Per physician discretion, may done one of two ways: Percutaneous nerve evaluation (PNE) in the [facility] urology office. Performed by urologist under  local anesthesia (numbing the area with lidocaine) using a spinal needle for placement of test wire, which usually stays in place for 5-7 days to determine therapy response. Test lead placement in OR under anesthesia. Usually stays in place 2 weeks to determine therapy response. > Step 2: Permanent implantation of sacral neuromodulation device, which is performed in the OR.  Sacral neuromodulation implants: All are conditionally MRI safe. Manufacturer: Medtronic Website: BuffaloDryCleaner.gl therapy/right-for-you.html Options: lnterStim X: Non-rechargeable. The battery lasts 10 years on average. lnterStim Micro: Rechargeable. The battery lasts 15 years on average and must be charged routinely. Approximately 50% smaller implant than lnterStim X implant.  Manufacturer: Axonics Website: Findrealrelief.axonics.com Options: Non-rechargeable (Axonics F15): The  battery lasts 15 years on average. Rechargeable (Axonics R20): The battery lasts 20 years on average and must be charged in office for about 1 hour every 6-10 months on average. Approximately 50% smaller implant than Axonics non-rechargeable implant.  Note: Generally the rechargeable devices are only advised for very small or thin patients who may not have sufficient adipose tissue to comfortably overlay the implanted device.  Suprapubic catheter (SP tube) placement. Only done in severely refractory OAB when all other options have failed or are not a viable treatment choice depending on patient factors. Involves placement of a catheter through the lower abdomen into the bladder to continuously drain the bladder into an external collection bag, which patient can then empty at their convenience every few hours. Done via an outpatient surgical procedure in the OR under anesthesia. Risks may included but are not limited to: surgical site pain, infections, skin irritation /  breakdown, chronic bacteriuria, symptomatic UTls. The SP tube must stay in place continuously. This is a reversible procedure however - the insertion site will close if catheter is removed for more than a few hours. The SP tube must be exchanged routinely every 4 weeks to prevent the catheter from becoming clogged with sediment. SP tube exchanges are typically performed at a urology nurse visit or by a home health nurse.

## 2023-08-09 ENCOUNTER — Other Ambulatory Visit (HOSPITAL_COMMUNITY): Payer: Self-pay

## 2023-08-10 ENCOUNTER — Other Ambulatory Visit (HOSPITAL_COMMUNITY): Payer: Self-pay

## 2023-08-10 MED ORDER — OLMESARTAN MEDOXOMIL-HCTZ 20-12.5 MG PO TABS
1.0000 | ORAL_TABLET | Freq: Every day | ORAL | 1 refills | Status: AC
  Filled 2023-08-11: qty 90, 90d supply, fill #0

## 2023-08-10 MED ORDER — PANTOPRAZOLE SODIUM 40 MG PO TBEC: 40.0000 mg | DELAYED_RELEASE_TABLET | Freq: Every day | ORAL | 1 refills | Status: AC

## 2023-08-10 MED ORDER — MIRTAZAPINE 30 MG PO TABS
30.0000 mg | ORAL_TABLET | Freq: Every day | ORAL | 11 refills | Status: AC
Start: 2023-08-06 — End: ?

## 2023-08-10 MED ORDER — LEVOTHYROXINE SODIUM 100 MCG PO TABS: 100.0000 ug | ORAL_TABLET | Freq: Every day | ORAL | 1 refills | Status: AC

## 2023-08-10 MED ORDER — ESCITALOPRAM OXALATE 20 MG PO TABS: 20.0000 mg | ORAL_TABLET | Freq: Every day | ORAL | 0 refills | Status: AC

## 2023-08-10 MED ORDER — ESCITALOPRAM OXALATE 10 MG PO TABS: 10.0000 mg | ORAL_TABLET | Freq: Every day | ORAL | 0 refills | Status: DC

## 2023-08-10 MED ORDER — ATORVASTATIN CALCIUM 80 MG PO TABS: 80.0000 mg | ORAL_TABLET | Freq: Every evening | ORAL | 2 refills | Status: AC

## 2023-08-10 MED ORDER — TRIMETHOPRIM 100 MG PO TABS
100.0000 mg | ORAL_TABLET | Freq: Every day | ORAL | 4 refills | Status: DC
  Filled 2023-08-11: qty 30, 30d supply, fill #0

## 2023-08-10 MED ORDER — EZETIMIBE 10 MG PO TABS: 10.0000 mg | ORAL_TABLET | Freq: Every day | ORAL | 1 refills | Status: AC

## 2023-08-11 ENCOUNTER — Other Ambulatory Visit (HOSPITAL_COMMUNITY): Payer: Self-pay

## 2023-08-24 ENCOUNTER — Other Ambulatory Visit (HOSPITAL_COMMUNITY): Payer: Self-pay

## 2023-08-25 ENCOUNTER — Other Ambulatory Visit (HOSPITAL_COMMUNITY): Payer: Self-pay

## 2023-08-25 ENCOUNTER — Telehealth: Payer: Self-pay

## 2023-08-25 ENCOUNTER — Other Ambulatory Visit: Payer: Self-pay

## 2023-08-25 MED ORDER — GEMTESA 75 MG PO TABS
75.0000 mg | ORAL_TABLET | Freq: Every day | ORAL | 11 refills | Status: DC
  Filled 2023-08-25 – 2023-08-31 (×2): qty 30, 30d supply, fill #0

## 2023-08-25 NOTE — Telephone Encounter (Signed)
 Patient requesting  Vibegron (GEMTESA) 75 MG TABS  Refills.  [facility] Phone: [phone]  Fax: [phone]

## 2023-08-25 NOTE — Telephone Encounter (Signed)
 Rx sent

## 2023-08-26 ENCOUNTER — Other Ambulatory Visit (HOSPITAL_COMMUNITY): Payer: Self-pay

## 2023-08-30 ENCOUNTER — Ambulatory Visit: Payer: HMO | Admitting: Urology

## 2023-08-31 ENCOUNTER — Other Ambulatory Visit (HOSPITAL_COMMUNITY): Payer: Self-pay

## 2023-08-31 ENCOUNTER — Telehealth: Payer: Self-pay

## 2023-09-01 NOTE — Telephone Encounter (Signed)
 Patient  was made aware and verbalized understanding."Only alternative in same drug class would be Myrbetriq. She took it before and it worked well but was also expensive.  Not a safe candidate for anticholinergic medications (such as Ditropan, which she took in the past) at this point due to age and risk for side effects based on patient's age, comorbidities, and pre-existing dry mouth / dry eyes / constipation.  Next options would be 3rd line OAB therapies like PTNS or bladder Botox." We will discuss at her next OV on 09/21/23.

## 2023-09-02 ENCOUNTER — Other Ambulatory Visit (HOSPITAL_COMMUNITY): Payer: Self-pay

## 2023-09-16 NOTE — Progress Notes (Signed)
 Name: Kristina Mcdonald DOB: Dec 15, 1955 MRN: [id number]  History of Present Illness: Kristina Mcdonald is a 68 y.o. female who presents today for follow up visit at [facility] [place]. - GU history: 1. Recurrent UTI. 2. OAB with urinary frequency, nocturia, urgency, and urge incontinence. - Her neurogenic risk factors include smoking history and prior stroke with residual left-sided weakness. - Not a safe candidate for anticholinergic medications (such as Ditropan, which she took in the past) at this point due to age and risk for side effects based on patient's age, comorbidities, and pre-existing dry mouth / dry eyes / constipation.  - Previously tried Myrbetriq 50 mg - worked well but too expensive. 3. Stress urinary incontinence (occasional). Not significantly bothersome per patient.  4. Cystocele. 5. Lichen sclerosus et atrophicus.  - Temovate 0.05% ointment PRN as prescribed by GYN provider. 6. Fecal incontinence. - 10/04/2015: Anal ultrasound performed at [facility]. - 10/08/2015: Anorectal manometry performed at [facility]. - 2017: Did pelvic floor physical therapy for UUI & FI.  Urine culture results in past 12 months: - 11/25/2022: Positive for Klebsiella pneumoniae - 03/19/2023: Positive for E. Coli (MDRO) - 04/09/2023: Positive for Enterococcus faecalis  At 1st urology visit on 07/19/2023: - PVR = 0 ml. - The plan was: 1. For recurrent UTIs:  - Topical vaginal estrogen cream every other night (long term). - Trimethoprim 100 mg daily. - Maintain adequate fluid intake daily to flush out the urinary tract. - Consider OTC supplements for UTI prevention. 2. For OAB with urinary frequency, nocturia, urgency, and urge incontinence: - Discontinue Oxybutynin (Ditropan) 5 mg.  - Trial of Gemtesa 75 mg daily (samples provided).  - Avoid / minimize caffeine intake (especially within 8 hours before bedtime). - Minimize evening fluid intake to reduce night time urination. 3. Lichen  sclerosus et atrophicus.  - Clobetasol (Temovate) 0.05% ointment PRN as prescribed by GYN provider. - Topical vaginal estrogen cream use as prescribed.  Since last visit: > 08/31/2023: Patient called saying Leslye Peer was cost-prohibitive.   Today: She reports 0 UTIs since last visit. She has been using vaginal estrogen cream at a frequency of 3 time(s) per day. She denies vaginal pain, bleeding, abnormal discharge, itching, dryness.  She reports decreasing her caffeine intake and minimizing evening fluid intake since last visit. With those changes she reports noticeable improvement in her urinary frequency, nocturia, urgency, and urge incontinence even without taking any OAB-type medication. At this time she reports that these symptoms are manageable with those changes.   Denies dysuria, gross hematuria, straining to void, or sensations of incomplete emptying.   Medications: Current Outpatient Medications  Medication Sig Dispense Refill   ALPRAZolam (XANAX) 0.5 MG tablet Take 0.5 mg by mouth 4 (four) times daily as needed.     amLODipine (NORVASC) 5 MG tablet Take 1 tablet (5 mg total) by mouth daily. 90 tablet 1   Ascorbic Acid (VITAMIN C) 100 MG tablet Take 100 mg by mouth daily.     aspirin EC 81 MG tablet Take 81 mg by mouth daily.     atorvastatin (LIPITOR) 80 MG tablet Take 80 mg by mouth daily.     atorvastatin (LIPITOR) 80 MG tablet Take 1 tablet (80 mg total) by mouth at bedtime. 90 tablet 2   buPROPion (WELLBUTRIN XL) 150 MG 24 hr tablet Take 150 mg by mouth daily.     cetirizine (ZYRTEC) 10 MG tablet Take 10 mg by mouth at bedtime.      cholecalciferol (VITAMIN  D3) 25 MCG (1000 UNIT) tablet Take 1,000 Units by mouth daily.     cyanocobalamin (VITAMIN B12) 1000 MCG tablet Take 1,000 mcg by mouth daily.     dicyclomine (BENTYL) 20 MG tablet Take 20 mg by mouth 3 (three) times daily before meals.     escitalopram (LEXAPRO) 10 MG tablet Take 1 tablet (10 mg total) by mouth daily. 90  tablet 0   escitalopram (LEXAPRO) 20 MG tablet Take 20 mg by mouth daily.     escitalopram (LEXAPRO) 20 MG tablet Take 1 tablet (20 mg total) by mouth daily. 90 tablet 0   estradiol (ESTRACE) 0.1 MG/GM vaginal cream Discard plastic applicator. Insert a blueberry size amount (approximately 1 gram) of cream on fingertip inside vagina at bedtime every night for 1 week then every other night. For long term use. 42.5 g 3   ezetimibe (ZETIA) 10 MG tablet Take 10 mg by mouth daily.     ezetimibe (ZETIA) 10 MG tablet Take 1 tablet (10 mg total) by mouth daily. 90 tablet 1   levothyroxine (SYNTHROID) 100 MCG tablet Take 100 mcg by mouth daily.     levothyroxine (SYNTHROID) 100 MCG tablet Take 1 tablet (100 mcg total) by mouth daily. 90 tablet 1   mirtazapine (REMERON) 30 MG tablet Take 30 mg by mouth daily.     mirtazapine (REMERON) 30 MG tablet Take 1 tablet (30 mg total) by mouth daily. 30 tablet 11   olmesartan-hydrochlorothiazide (BENICAR HCT) 20-12.5 MG tablet Take 1 tablet by mouth daily.     olmesartan-hydrochlorothiazide (BENICAR HCT) 20-12.5 MG tablet Take 1 tablet by mouth daily. 90 tablet 1   pantoprazole (PROTONIX) 40 MG tablet Take 40 mg by mouth daily.     pantoprazole (PROTONIX) 40 MG tablet Take 1 tablet (40 mg total) by mouth daily. 90 tablet 1   triamcinolone ointment (KENALOG) 0.5 % APPLY TO AFFECTED AREA TWICE A DAY 30 g 3   trimethoprim (TRIMPEX) 100 MG tablet Take 1 tablet (100 mg total) by mouth daily. 30 tablet 5   trimethoprim (TRIMPEX) 100 MG tablet Take 1 tablet (100 mg total) by mouth daily. 30 tablet 4   Vibegron (GEMTESA) 75 MG TABS Take 1 tablet (75 mg total) by mouth daily.     Vibegron (GEMTESA) 75 MG TABS Take 1 tablet (75 mg total) by mouth daily. 30 tablet 11   vitamin E 200 UNIT capsule Take 200 Units by mouth daily.     No current facility-administered medications for this visit.    Allergies: No Known Allergies  Past Medical History:  Diagnosis Date   BV  (bacterial vaginosis) 11/22/2013   CAD (coronary artery disease) 02/2001   Nonobstructive per cath.   Chronic anxiety    Hyperlipidemia    Hypertension    Hypothyroidism 10/2006   Status post thyroidectomy for thyroid mass.   Insomnia 05/10/2013   Internal hemorrhoids 09/2003   LLQ pain 10/09/2014   Overactive bladder    Seasonal allergies    Stress 05/10/2013   Stroke (HCC)    Thrombocytopenia (HCC)    Chronic   Thrush, oral 11/22/2013   Tubular adenoma    Vaginal discharge 11/22/2013   Vaginal odor 10/09/2014   Past Surgical History:  Procedure Laterality Date   ABDOMINAL HYSTERECTOMY     APPENDECTOMY  11/2002.   Incidental during laparotomy and excision of ovarian remnant.   CARDIAC CATHETERIZATION  02/2001   Nonobstructive CAD   COLONOSCOPY  09/2003   Per Dr.  Rourk. Internal hemorrhoids.normal otherwise   COLONOSCOPY N/A 12/04/2013   Dr. Jena Gauss- normal rectum aside from minimal internal hemorrhoids. the patient had a 6mm polyp in the mid ascending segment and an 8mm poylp in the mid descending segment o/w the remaider of the colonic mucosa appeared normal. the distal 10cm of terminal mucosa also appeared normal.tubular adenoma on bx   ESOPHAGOGASTRODUODENOSCOPY N/A 12/04/2013   Dr.Rourk- distal esophageal erosions w/i 5mm of the GE junction. no basrrett's esophagus. tubular esophagus patent throughout its course. focal antral erosions o/w normal appearing gastric mucosa. patent pylorus, normal first and second portion of the duodenum. mild chronic inflammation on bx   INNER EAR SURGERY     ear drum   LAPAROTOMY  11/2002   Excision of ovarian remnant   THYROIDECTOMY  10/2006   Neg for cancer per patient   TOTAL ABDOMINAL HYSTERECTOMY W/ BILATERAL SALPINGOOPHORECTOMY  09/2001.   Family History  Problem Relation Age of Onset   Heart disease Mother        CHF   Cancer Father        prostate   Stroke Brother    Stroke Sister    Heart disease Sister        CHF   Multiple sclerosis  Sister    Stroke Daughter        light strokes   Heart attack Sister    Colon cancer Neg Hx    Social History   Socioeconomic History   Marital status: Married    Spouse name: Not on file   Number of children: Not on file   Years of education: Not on file   Highest education level: Not on file  Occupational History   Occupation: Administrator, arts: [employer]  Tobacco Use   Smoking status: Never   Smokeless tobacco: Never   Tobacco comments:    Never smoker  Vaping Use   Vaping status: Never Used  Substance and Sexual Activity   Alcohol use: No   Drug use: No   Sexual activity: Not Currently    Birth control/protection: Surgical    Comment: hyst  Other Topics Concern   Not on file  Social History Narrative   Not on file   Social Drivers of Health   Financial Resource Strain: Low Risk  (03/19/2023)   Overall Financial Resource Strain (CARDIA)    Difficulty of Paying Living Expenses: Not hard at all  Food Insecurity: No Food Insecurity (03/19/2023)   Hunger Vital Sign    Worried About Running Out of Food in the Last Year: Never true    Ran Out of Food in the Last Year: Never true  Transportation Needs: No Transportation Needs (03/19/2023)   PRAPARE - Administrator, Civil Service (Medical): No    Lack of Transportation (Non-Medical): No  Physical Activity: Insufficiently Active (03/19/2023)   Exercise Vital Sign    Days of Exercise per Week: 1 day    Minutes of Exercise per Session: 10 min  Stress: Stress Concern Present (03/19/2023)   [employer] of Occupational Health - Occupational Stress Questionnaire    Feeling of Stress : Very much  Social Connections: Moderately Integrated (03/19/2023)   Social Connection and Isolation Panel [NHANES]    Frequency of Communication with Friends and Family: More than three times a week    Frequency of Social Gatherings with Friends and Family: Once a week    Attends Religious Services: 1 to  4 times per  year    Active Member of Clubs or Organizations: No    Attends Banker Meetings: Never    Marital Status: Married  Catering manager Violence: Not At Risk (03/19/2023)   Humiliation, Afraid, Rape, and Kick questionnaire    Fear of Current or Ex-Partner: No    Emotionally Abused: No    Physically Abused: No    Sexually Abused: No    Review of Systems Constitutional: Patient denies any unintentional weight loss or change in strength lntegumentary: Patient denies any rashes or pruritus Cardiovascular: Patient denies chest pain or syncope Respiratory: Patient denies shortness of breath Musculoskeletal: Patient denies muscle cramps or weakness Neurologic: Patient denies convulsions or seizures Allergic/Immunologic: Patient denies recent allergic reaction(s) Hematologic/Lymphatic: Patient denies bleeding tendencies Endocrine: Patient denies heat/cold intolerance  GU: As per HPI.  OBJECTIVE Vitals:   09/21/23 0935  BP: 125/77  Pulse: 92  Temp: 98.3 F (36.8 C)   There is no height or weight on file to calculate BMI.  Physical Examination Constitutional: No obvious distress; patient is non-toxic appearing  Cardiovascular: No visible lower extremity edema.  Respiratory: The patient does not have audible wheezing/stridor; respirations do not appear labored  Gastrointestinal: Abdomen non-distended Musculoskeletal: Normal ROM of UEs  Skin: No obvious rashes/open sores  Neurologic: CN 2-12 grossly intact Psychiatric: Answered questions appropriately with normal affect  Hematologic/Lymphatic/Immunologic: No obvious bruises or sites of spontaneous bleeding  Urine microscopy: >30 WBC/hpf, 0-2 RBC/hpf, few bacteria PVR: 0 ml  ASSESSMENT Recurrent UTI - Plan: BLADDER SCAN AMB NON-IMAGING, Urinalysis, Routine w reflex microscopic  OAB (overactive bladder) - Plan: BLADDER SCAN AMB NON-IMAGING, Urinalysis, Routine w reflex microscopic  Urge incontinence of  urine - Plan: BLADDER SCAN AMB NON-IMAGING, Urinalysis, Routine w reflex microscopic  Urinary frequency - Plan: BLADDER SCAN AMB NON-IMAGING, Urinalysis, Routine w reflex microscopic  Lichen sclerosus et atrophicus  1. Recurrent UTIs:  - Asymptomatic today with no known UTIs since last visit. - Advised to continue topical vaginal estrogen cream (every other night; not 3x/day) and Trimethoprim 100 mg daily for UTI prevention. 2. OAB with urinary frequency, nocturia, urgency, and urge incontinence: - Not a safe candidate for anticholinergic medications (such as Ditropan, which she took in the past) at this point due to age and risk for side effects based on patient's age, comorbidities, and pre-existing dry mouth / dry eyes / constipation.  - Beta-3 agonist medications (Myrbetriq and Gemtesa) are cost-prohibitive per patient.  - She decided to continue with behavioral modifications including minimizing/avoiding caffeine intake (especially within 8 hours before bedtime) and minimizing/avoiding evening/overnight fluid intake.  3. Lichen sclerosus et atrophicus.  Advised to continue Clobetasol (Temovate) 0.05% ointment PRN as prescribed by GYN provider and topical vaginal estrogen cream use as prescribed.  We agreed to plan for follow up in 6 months or sooner if needed. Patient verbalized understanding of and agreement with current plan. All questions were answered.  PLAN Advised the following: 1. Continue Trimethoprim 100 mg daily. 2. Maintain adequate fluid intake daily. 3. Avoid / minimize caffeine intake (especially within 8 hours before bedtime). 4. Avoid / minimize evening fluid intake. 5. Continue Clobetasol (Temovate) 0.05% ointment PRN as prescribed by GYN provider. 6. Continue topical vaginal estrogen cream every other night. 7. Return in about 6 months (around 03/23/2024) for UA, PVR, & f/u with Evette Georges NP.  Orders Placed This Encounter  Procedures   Urinalysis, Routine w  reflex microscopic   BLADDER SCAN AMB NON-IMAGING   Total time spent  caring for the patient today was over 30 minutes. This includes time spent on the date of the visit reviewing the patient's chart before the visit, time spent during the visit, and time spent after the visit on documentation. Over 50% of that time was spent in face-to-face time with this patient for direct counseling. E&M based on time and complexity of medical decision making.  It has been explained that the patient is to follow regularly with their PCP in addition to all other providers involved in their care and to follow instructions provided by these respective offices. Patient advised to contact urology clinic if any urologic-pertaining questions, concerns, new symptoms or problems arise in the interim period.  Patient Instructions      Overactive bladder (OAB) overview for patients:  Symptoms may include: urinary urgency ("gotta go" feeling) urinary frequency (voiding >8 times per day) night time urination (nocturia) urge incontinence of urine (UUI)  While we do not know the exact etiology of OAB, several treatment options exist including:  Behavioral therapy: Reducing fluid intake Decreasing bladder stimulants (such as caffeine) and irritants (such as acidic food, spicy foods, alcohol) Urge suppression strategies Bladder retraining via timed voiding  Pelvic floor physical therapy  Medication(s) - can use one or both of the drug classes below. Anticholinergic / antimuscarinic medications:  Mechanism of action: Activate M3 receptors to reduce detrusor stimulation and increase bladder capacity   (parasympathetic nervous system). Effect: Relaxes the bladder to decrease overactivity, increase bladder storage capacity, and increase time between voids. Onset: Slow acting (may take 8-12 weeks to determine efficacy). Medications include: Vesicare (Solifenacin), Ditropan (Oxybutynin), Detrol (Tolterodine), Toviaz  (Fesoterodine), Sanctura (Trospium), Urispas (Flavoxate), Enablex (Darifenacin), Bentyl (Dicyclomine), Levsin (Hyoscyamine ). Potential side effects include but are not limited to: Dry eyes, dry mouth, constipation, cognitive impairment, dementia risk with long term use, and urinary retention/ incomplete bladder emptying. Insurance companies generally prefer for patients to try 1-2 anticholinergic / antimuscarinic medications first due to low cost. Some exceptions are made based on patient-specific comorbidities / risk factors. Beta-3 agonist medications: Mechanism of action: Stimulates selective B3 adrenergic receptors to cause smooth muscle bladder relaxation (sympathetic nervous system). Effect: Relaxes the bladder to decrease overactivity, increase bladder storage capacity, and increase time between voids. Onset: Slow acting (may take 8-12 weeks to determine efficacy). Medications include: Myrbetriq (Mirabegron) and Vibegron Leslye Peer). Potential side effects include but are not limited to: urinary retention / incomplete bladder emptying and elevated blood pressure (more likely to occur in individuals with pre-existing uncontrolled hypertension). These medications tend to be more expensive than the anticholinergic / antimuscarinic medications.   For patients with refractory OAB (if the above treatment options have been unsuccessful): Posterior tibial nerve stimulation (PTNS). Small acupuncture-type needle inserted near ankle with electric current to stimulate bladder via posterior tibial nerve pathway. Initially requires 12 weekly in-office treatments lasting 30 minutes each; followed by monthly in-office treatments lasting 30 minutes each for 1 year.  Bladder Botox injections. How it is done: Typically done via in-office cystoscopy; sometimes done in the OR depending on the situation. The bladder is numbed with lidocaine instilled via a catheter. Then the urologist injects Botox into the bladder  muscle wall in about 20 locations. Causes local paralysis of the bladder muscle at the injection sites to reduce bladder muscle overactivity / spasms. The effect lasts for approximately 6 months and cannot be reversed once performed. Risks may included but are not limited to: infection, incomplete bladder emptying/ urinary retention, short term need for  self-catheterization or indwelling catheter, and need for repeat therapy. There is a 5-12% chance of needing to catheterize with Botox - that usually resolves in a few months as the Botox wears off. Typically Botox injections would need to be repeated every 3-12 months since this is not a permanent therapy.  Sacral neuromodulation trial (Medtronic lnterStim or Axonics implant). Sacral neuromodulation is FDA-approved for uncontrolled urinary urgency, urinary frequency, urinary urge incontinence, non-obstructive urinary retention, or fecal incontinence. It is not FDA-approved as a treatment for pain. The goal of this therapy is at least a 50% improvement in symptoms. It is NOT realistic to expect a 100% cure. This is a a 2-step outpatient procedure. After a successful test period, a permanent wire and generator are placed in the OR. We discussed the risk of infection. We reviewed the fact that about 30% of patients fail the test phase and are not candidates for permanent generator placement. During the 1-2 week trial phase, symptoms are documented by the patient to determine response. If patient gets at least a 50% improvement in symptoms, they may then proceed with Step 2. Step 1: Trial lead placement. Per physician discretion, may done one of two ways: Percutaneous nerve evaluation (PNE) in the [facility] urology office. Performed by urologist under local anesthesia (numbing the area with lidocaine) using a spinal needle for placement of test wire, which usually stays in place for 5-7 days to determine therapy response. Test lead placement in OR under  anesthesia. Usually stays in place 2 weeks to determine therapy response. > Step 2: Permanent implantation of sacral neuromodulation device, which is performed in the OR.  Sacral neuromodulation implants: All are conditionally MRI safe. Manufacturer: Medtronic Website: [employer]gl therapy/right-for-you.html Options: lnterStim X: Non-rechargeable. The battery lasts 10 years on average. lnterStim Micro: Rechargeable. The battery lasts 15 years on average and must be charged routinely. Approximately 50% smaller implant than lnterStim X implant.  Manufacturer: Axonics Website: Findrealrelief.axonics.com Options: Non-rechargeable (Axonics F15): The battery lasts 15 years on average. Rechargeable (Axonics R20): The battery lasts 20 years on average and must be charged in office for about 1 hour every 6-10 months on average. Approximately 50% smaller implant than Axonics non-rechargeable implant.  Note: Generally the rechargeable devices are only advised for very small or thin patients who may not have sufficient adipose tissue to comfortably overlay the implanted device.  Suprapubic catheter (SP tube) placement. Only done in severely refractory OAB when all other options have failed or are not a viable treatment choice depending on patient factors. Involves placement of a catheter through the lower abdomen into the bladder to continuously drain the bladder into an external collection bag, which patient can then empty at their convenience every few hours. Done via an outpatient surgical procedure in the OR under anesthesia. Risks may included but are not limited to: surgical site pain, infections, skin irritation / breakdown, chronic bacteriuria, symptomatic UTls. The SP tube must stay in place continuously. This is a reversible procedure however - the insertion site will close if catheter is removed for more than a  few hours. The SP tube must be exchanged routinely every 4 weeks to prevent the catheter from becoming clogged with sediment. SP tube exchanges are typically performed at a urology nurse visit or by a home health nurse.   Electronically signed by:  Donnita Falls, FNP   09/21/23    10:11 AM

## 2023-09-17 ENCOUNTER — Other Ambulatory Visit (HOSPITAL_COMMUNITY): Payer: Self-pay

## 2023-09-21 ENCOUNTER — Ambulatory Visit: Payer: HMO | Admitting: Urology

## 2023-09-21 VITALS — BP 125/77 | HR 92 | Temp 98.3°F

## 2023-09-21 DIAGNOSIS — R350 Frequency of micturition: Secondary | ICD-10-CM

## 2023-09-21 DIAGNOSIS — N3941 Urge incontinence: Secondary | ICD-10-CM | POA: Diagnosis not present

## 2023-09-21 DIAGNOSIS — Z87440 Personal history of urinary (tract) infections: Secondary | ICD-10-CM

## 2023-09-21 DIAGNOSIS — L900 Lichen sclerosus et atrophicus: Secondary | ICD-10-CM

## 2023-09-21 DIAGNOSIS — N390 Urinary tract infection, site not specified: Secondary | ICD-10-CM | POA: Diagnosis not present

## 2023-09-21 DIAGNOSIS — N3281 Overactive bladder: Secondary | ICD-10-CM

## 2023-09-21 LAB — MICROSCOPIC EXAMINATION: WBC, UA: >30 /HPF — AB (ref 0–5)

## 2023-09-21 LAB — URINALYSIS, ROUTINE W REFLEX MICROSCOPIC
Bilirubin, UA: NEGATIVE
Glucose, UA: NEGATIVE
Ketones, UA: NEGATIVE
Nitrite, UA: NEGATIVE
Protein,UA: NEGATIVE
RBC, UA: NEGATIVE
Specific Gravity, UA: 1.02 (ref 1.005–1.030)
Urobilinogen, Ur: 0.2 mg/dL (ref 0.2–1.0)
pH, UA: 6.5 (ref 5.0–7.5)

## 2023-09-21 LAB — BLADDER SCAN AMB NON-IMAGING: Scan Result: 0

## 2023-09-21 NOTE — Patient Instructions (Signed)

## 2023-09-23 DIAGNOSIS — E559 Vitamin D deficiency, unspecified: Secondary | ICD-10-CM | POA: Diagnosis not present

## 2023-09-23 DIAGNOSIS — E7849 Other hyperlipidemia: Secondary | ICD-10-CM | POA: Diagnosis not present

## 2023-09-23 DIAGNOSIS — R739 Hyperglycemia, unspecified: Secondary | ICD-10-CM | POA: Diagnosis not present

## 2023-09-23 DIAGNOSIS — E039 Hypothyroidism, unspecified: Secondary | ICD-10-CM | POA: Diagnosis not present

## 2023-09-23 DIAGNOSIS — I10 Essential (primary) hypertension: Secondary | ICD-10-CM | POA: Diagnosis not present

## 2023-09-23 DIAGNOSIS — R1310 Dysphagia, unspecified: Secondary | ICD-10-CM | POA: Diagnosis not present

## 2023-09-23 DIAGNOSIS — G9332 Myalgic encephalomyelitis/chronic fatigue syndrome: Secondary | ICD-10-CM | POA: Diagnosis not present

## 2023-09-23 DIAGNOSIS — R5383 Other fatigue: Secondary | ICD-10-CM | POA: Diagnosis not present

## 2023-09-23 DIAGNOSIS — Z0001 Encounter for general adult medical examination with abnormal findings: Secondary | ICD-10-CM | POA: Diagnosis not present

## 2023-09-23 DIAGNOSIS — Z6841 Body Mass Index (BMI) 40.0 and over, adult: Secondary | ICD-10-CM | POA: Diagnosis not present

## 2023-09-23 DIAGNOSIS — R7309 Other abnormal glucose: Secondary | ICD-10-CM | POA: Diagnosis not present

## 2023-09-23 DIAGNOSIS — N3281 Overactive bladder: Secondary | ICD-10-CM | POA: Diagnosis not present

## 2023-09-23 DIAGNOSIS — E782 Mixed hyperlipidemia: Secondary | ICD-10-CM | POA: Diagnosis not present

## 2023-09-23 DIAGNOSIS — Z1331 Encounter for screening for depression: Secondary | ICD-10-CM | POA: Diagnosis not present

## 2023-09-29 NOTE — Progress Notes (Unsigned)
 GI Office Note    Referring Provider: Assunta Found, MD Primary Care Physician:  Assunta Found, MD  Primary Gastroenterologist: Gerrit Friends.Rourk, MD  Chief Complaint   No chief complaint on file.  History of Present Illness   Kristina Mcdonald is a 68 y.o. female presenting today at the request of Assunta Found, MD for dysphagia.  EGD June 2015: -Erosive reflux esophagitis -Esophageal dilation performed -Gastric erosions s/p biopsy -Advised to increase Nexium to 40 mg daily -Biopsies with chronic inflammation.  Colonoscopy June 2015: -6 mm in the mid ascending segment -8 mm in the mid descending segment -Distal TI normal -Rectal vault small -Internal hemorrhoids noted -Segmental biopsies performed and were benign, polyps revealed to be tubular adenomas.  Last seen by our office in 2015 for diarrhea.  Per review of chart she has been seen by digestive health services with [facility], last seen in 2017.  Anal ultrasound and anorectal manometry performed in February 2017 by [facility].  Anorectal manometry and anal ultrasound unavailable for review in Care Everywhere.  Today:    Wt Readings from Last 3 Encounters:  03/19/23 206 lb 8 oz (93.7 kg)  07/11/21 203 lb (92.1 kg)  05/16/21 203 lb (92.1 kg)    Current Outpatient Medications  Medication Sig Dispense Refill   ALPRAZolam (XANAX) 0.5 MG tablet Take 0.5 mg by mouth 4 (four) times daily as needed.     amLODipine (NORVASC) 5 MG tablet Take 1 tablet (5 mg total) by mouth daily. 90 tablet 1   Ascorbic Acid (VITAMIN C) 100 MG tablet Take 100 mg by mouth daily.     aspirin EC 81 MG tablet Take 81 mg by mouth daily.     atorvastatin (LIPITOR) 80 MG tablet Take 80 mg by mouth daily.     atorvastatin (LIPITOR) 80 MG tablet Take 1 tablet (80 mg total) by mouth at bedtime. 90 tablet 2   buPROPion (WELLBUTRIN XL) 150 MG 24 hr tablet Take 150 mg by mouth daily.     cetirizine (ZYRTEC) 10 MG tablet  Take 10 mg by mouth at bedtime.      cholecalciferol (VITAMIN D3) 25 MCG (1000 UNIT) tablet Take 1,000 Units by mouth daily.     cyanocobalamin (VITAMIN B12) 1000 MCG tablet Take 1,000 mcg by mouth daily.     dicyclomine (BENTYL) 20 MG tablet Take 20 mg by mouth 3 (three) times daily before meals.     escitalopram (LEXAPRO) 10 MG tablet Take 1 tablet (10 mg total) by mouth daily. 90 tablet 0   escitalopram (LEXAPRO) 20 MG tablet Take 20 mg by mouth daily.     escitalopram (LEXAPRO) 20 MG tablet Take 1 tablet (20 mg total) by mouth daily. 90 tablet 0   estradiol (ESTRACE) 0.1 MG/GM vaginal cream Discard plastic applicator. Insert a blueberry size amount (approximately 1 gram) of cream on fingertip inside vagina at bedtime every night for 1 week then every other night. For long term use. 42.5 g 3   ezetimibe (ZETIA) 10 MG tablet Take 10 mg by mouth daily.     ezetimibe (ZETIA) 10 MG tablet Take 1 tablet (10 mg total) by mouth daily. 90 tablet 1   levothyroxine (SYNTHROID) 100 MCG tablet Take 100 mcg by mouth daily.     levothyroxine (SYNTHROID) 100 MCG tablet Take 1 tablet (100 mcg total) by mouth daily. 90 tablet 1   mirtazapine (REMERON) 30 MG tablet Take 30 mg by mouth daily.  mirtazapine (REMERON) 30 MG tablet Take 1 tablet (30 mg total) by mouth daily. 30 tablet 11   olmesartan-hydrochlorothiazide (BENICAR HCT) 20-12.5 MG tablet Take 1 tablet by mouth daily.     olmesartan-hydrochlorothiazide (BENICAR HCT) 20-12.5 MG tablet Take 1 tablet by mouth daily. 90 tablet 1   pantoprazole (PROTONIX) 40 MG tablet Take 40 mg by mouth daily.     pantoprazole (PROTONIX) 40 MG tablet Take 1 tablet (40 mg total) by mouth daily. 90 tablet 1   triamcinolone ointment (KENALOG) 0.5 % APPLY TO AFFECTED AREA TWICE A DAY 30 g 3   trimethoprim (TRIMPEX) 100 MG tablet Take 1 tablet (100 mg total) by mouth daily. 30 tablet 5   trimethoprim (TRIMPEX) 100 MG tablet Take 1 tablet (100 mg total) by mouth daily. 30  tablet 4   Vibegron (GEMTESA) 75 MG TABS Take 1 tablet (75 mg total) by mouth daily.     Vibegron (GEMTESA) 75 MG TABS Take 1 tablet (75 mg total) by mouth daily. 30 tablet 11   vitamin E 200 UNIT capsule Take 200 Units by mouth daily.     No current facility-administered medications for this visit.    Past Medical History:  Diagnosis Date   BV (bacterial vaginosis) 11/22/2013   CAD (coronary artery disease) 02/2001   Nonobstructive per cath.   Chronic anxiety    Hyperlipidemia    Hypertension    Hypothyroidism 10/2006   Status post thyroidectomy for thyroid mass.   Insomnia 05/10/2013   Internal hemorrhoids 09/2003   LLQ pain 10/09/2014   Overactive bladder    Seasonal allergies    Stress 05/10/2013   Stroke (HCC)    Thrombocytopenia (HCC)    Chronic   Thrush, oral 11/22/2013   Tubular adenoma    Vaginal discharge 11/22/2013   Vaginal odor 10/09/2014    Past Surgical History:  Procedure Laterality Date   ABDOMINAL HYSTERECTOMY     APPENDECTOMY  11/2002.   Incidental during laparotomy and excision of ovarian remnant.   CARDIAC CATHETERIZATION  02/2001   Nonobstructive CAD   COLONOSCOPY  09/2003   Per Dr. Jena Gauss. Internal hemorrhoids.normal otherwise   COLONOSCOPY N/A 12/04/2013   Dr. Jena Gauss- normal rectum aside from minimal internal hemorrhoids. the patient had a 6mm polyp in the mid ascending segment and an 8mm poylp in the mid descending segment o/w the remaider of the colonic mucosa appeared normal. the distal 10cm of terminal mucosa also appeared normal.tubular adenoma on bx   ESOPHAGOGASTRODUODENOSCOPY N/A 12/04/2013   Dr.Rourk- distal esophageal erosions w/i 5mm of the GE junction. no basrrett's esophagus. tubular esophagus patent throughout its course. focal antral erosions o/w normal appearing gastric mucosa. patent pylorus, normal first and second portion of the duodenum. mild chronic inflammation on bx   INNER EAR SURGERY     ear drum   LAPAROTOMY  11/2002   Excision of  ovarian remnant   THYROIDECTOMY  10/2006   Neg for cancer per patient   TOTAL ABDOMINAL HYSTERECTOMY W/ BILATERAL SALPINGOOPHORECTOMY  09/2001.    Family History  Problem Relation Age of Onset   Heart disease Mother        CHF   Cancer Father        prostate   Stroke Brother    Stroke Sister    Heart disease Sister        CHF   Multiple sclerosis Sister    Stroke Daughter        light strokes   Heart  attack Sister    Colon cancer Neg Hx     Allergies as of 09/30/2023   (No Known Allergies)    Social History   Socioeconomic History   Marital status: Married    Spouse name: Not on file   Number of children: Not on file   Years of education: Not on file   Highest education level: Not on file  Occupational History   Occupation: Administrator, arts: [employer]  Tobacco Use   Smoking status: Never   Smokeless tobacco: Never   Tobacco comments:    Never smoker  Vaping Use   Vaping status: Never Used  Substance and Sexual Activity   Alcohol use: No   Drug use: No   Sexual activity: Not Currently    Birth control/protection: Surgical    Comment: hyst  Other Topics Concern   Not on file  Social History Narrative   Not on file   Social Drivers of Health   Financial Resource Strain: Low Risk  (03/19/2023)   Overall Financial Resource Strain (CARDIA)    Difficulty of Paying Living Expenses: Not hard at all  Food Insecurity: No Food Insecurity (03/19/2023)   Hunger Vital Sign    Worried About Running Out of Food in the Last Year: Never true    Ran Out of Food in the Last Year: Never true  Transportation Needs: No Transportation Needs (03/19/2023)   PRAPARE - Administrator, Civil Service (Medical): No    Lack of Transportation (Non-Medical): No  Physical Activity: Insufficiently Active (03/19/2023)   Exercise Vital Sign    Days of Exercise per Week: 1 day    Minutes of Exercise per Session: 10 min  Stress: Stress Concern Present  (03/19/2023)   [employer] of Occupational Health - Occupational Stress Questionnaire    Feeling of Stress : Very much  Social Connections: Moderately Integrated (03/19/2023)   Social Connection and Isolation Panel [NHANES]    Frequency of Communication with Friends and Family: More than three times a week    Frequency of Social Gatherings with Friends and Family: Once a week    Attends Religious Services: 1 to 4 times per year    Active Member of [employer] or Organizations: No    Attends Banker Meetings: Never    Marital Status: Married  Catering manager Violence: Not At Risk (03/19/2023)   Humiliation, Afraid, Rape, and Kick questionnaire    Fear of Current or Ex-Partner: No    Emotionally Abused: No    Physically Abused: No    Sexually Abused: No     Review of Systems   Gen: Denies any fever, chills, fatigue, weight loss, lack of appetite.  CV: Denies chest pain, heart palpitations, peripheral edema, syncope.  Resp: Denies shortness of breath at rest or with exertion. Denies wheezing or cough.  GI: see HPI GU : Denies urinary burning, urinary frequency, urinary hesitancy MS: Denies joint pain, muscle weakness, cramps, or limitation of movement.  Derm: Denies rash, itching, dry skin Psych: Denies depression, anxiety, memory loss, and confusion Heme: Denies bruising, bleeding, and enlarged lymph nodes.  Physical Exam   There were no vitals taken for this visit.  General:   Alert and oriented. Pleasant and cooperative. Well-nourished and well-developed.  Head:  Normocephalic and atraumatic. Eyes:  Without icterus, sclera clear and conjunctiva pink.  Ears:  Normal auditory acuity. Mouth:  No deformity or lesions, oral mucosa pink.  Lungs:  Clear to  auscultation bilaterally. No wheezes, rales, or rhonchi. No distress.  Heart:  S1, S2 present without murmurs appreciated.  Abdomen:  +BS, soft, non-tender and non-distended. No HSM noted. No guarding or rebound. No  masses appreciated.  Rectal:  Deferred  Msk:  Symmetrical without gross deformities. Normal posture. Extremities:  Without edema. Neurologic:  Alert and  oriented x4;  grossly normal neurologically. Skin:  Intact without significant lesions or rashes. Psych:  Alert and cooperative. Normal mood and affect.  Assessment   HANADI STANLY is a 69 y.o. 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  Unclear when repeat was recommended however it has been almost 10 years and she is due. -  PLAN   ***    Brooke Bonito, MSN, FNP-BC, AGACNP-BC [facility]

## 2023-09-30 ENCOUNTER — Encounter: Payer: Self-pay | Admitting: Gastroenterology

## 2023-09-30 ENCOUNTER — Ambulatory Visit: Admitting: Gastroenterology

## 2023-09-30 VITALS — BP 136/75 | HR 103 | Temp 97.6°F | Ht 60.0 in | Wt 205.6 lb

## 2023-09-30 DIAGNOSIS — Z860100 Personal history of colon polyps, unspecified: Secondary | ICD-10-CM | POA: Diagnosis not present

## 2023-09-30 DIAGNOSIS — R1310 Dysphagia, unspecified: Secondary | ICD-10-CM | POA: Diagnosis not present

## 2023-09-30 DIAGNOSIS — R1319 Other dysphagia: Secondary | ICD-10-CM

## 2023-09-30 NOTE — Patient Instructions (Addendum)
 We are getting you scheduled for upper endoscopy with esophageal dilation and colonoscopy in the near future with Dr. Jena Gauss.   Please continue to cut out foods, especially meats and very small pieces and chew longer than you would expect to prior swallowing.  Please alternate with sips of liquids as well.  If at any point you feel like something is stuck and it will not go down or come back up despite drinking liquids or trying to cough it up then please head straight to the emergency department.  We will see you for follow-up post procedures to see how your swallowing is doing afterwards.  It was a pleasure to see you today. I want to create trusting relationships with patients. If you receive a survey regarding your visit,  I greatly appreciate you taking time to fill this out on paper or through your MyChart. I value your feedback.  Brooke Bonito, MSN, FNP-BC, AGACNP-BC [facility]

## 2023-10-05 ENCOUNTER — Encounter: Payer: Self-pay | Admitting: Gastroenterology

## 2023-10-07 ENCOUNTER — Encounter: Payer: Self-pay | Admitting: *Deleted

## 2023-10-07 ENCOUNTER — Other Ambulatory Visit: Payer: Self-pay | Admitting: Adult Health

## 2023-10-07 ENCOUNTER — Other Ambulatory Visit: Payer: Self-pay | Admitting: Urology

## 2023-10-07 DIAGNOSIS — L900 Lichen sclerosus et atrophicus: Secondary | ICD-10-CM

## 2023-10-07 DIAGNOSIS — N390 Urinary tract infection, site not specified: Secondary | ICD-10-CM

## 2023-11-08 DIAGNOSIS — Z6841 Body Mass Index (BMI) 40.0 and over, adult: Secondary | ICD-10-CM | POA: Diagnosis not present

## 2023-11-08 DIAGNOSIS — N390 Urinary tract infection, site not specified: Secondary | ICD-10-CM | POA: Diagnosis not present

## 2023-11-08 DIAGNOSIS — I10 Essential (primary) hypertension: Secondary | ICD-10-CM | POA: Diagnosis not present

## 2023-11-08 DIAGNOSIS — N3281 Overactive bladder: Secondary | ICD-10-CM | POA: Diagnosis not present

## 2023-11-08 DIAGNOSIS — E039 Hypothyroidism, unspecified: Secondary | ICD-10-CM | POA: Diagnosis not present

## 2023-11-12 NOTE — Patient Instructions (Signed)
 Kristina Mcdonald  11/12/2023     @[email or web address] @   Your procedure is scheduled on 11/17/2023.   Report to [facility] at 9:00 A.M.   Call this number if you have problems the morning of surgery:  [phone]  If you experience any cold or flu symptoms such as cough, fever, chills, shortness of breath, etc. between now and your scheduled surgery, please notify us  at the above number.   Remember:   Please follow the diet and prep instructions given to you by Dr Drucilla Georgis office.   You may drink clear liquids until 7:00 AM .  Clear liquids allowed are:                    Water , Carbonated beverages (diabetics please choose diet or no sugar options), Clear Tea (No creamer, milk, or cream, including half & half and powdered creamer), and Clear Sports drink (No red color; diabetics please choose diet or no sugar options)    Take these medicines the morning of surgery with A SIP OF WATER  : Xanax  Norvasc  Wellbutrin Zyrtec Lexapro  Synthroid  Protonix  and Gemtesa     Do not wear jewelry, make-up or nail polish, including gel polish,  artificial nails, or any other type of covering on natural nails (fingers and  toes).  Do not wear lotions, powders, or perfumes, or deodorant.  Do not shave 48 hours prior to surgery.  Men may shave face and neck.  Do not bring valuables to the hospital.  [facility] is not responsible for any belongings or valuables.  Contacts, dentures or bridgework may not be worn into surgery.  Leave your suitcase in the car.  After surgery it may be brought to your room.  For patients admitted to the hospital, discharge time will be determined by your treatment team.  Patients discharged the day of surgery will not be allowed to drive home.   Name and phone number of your driver:   Family  Special instructions:  N/A  Please read over the following fact sheets that you were given.  Care and Recovery After Surgery  Colonoscopy, Adult A colonoscopy is a procedure  to look at the entire large intestine. This procedure is done using a long, thin, flexible tube that has a camera on the end. You may have a colonoscopy: As a part of normal colorectal screening. If you have certain symptoms, such as: A low number of red blood cells in your blood (anemia). Diarrhea that does not go away. Pain in your abdomen. Blood in your stool. A colonoscopy can help screen for and diagnose medical problems, including: An abnormal growth of cells or tissue (tumor). Abnormal growths within the lining of your intestine (polyps). Inflammation. Areas of bleeding. Tell your health care provider about: Any allergies you have. All medicines you are taking, including vitamins, herbs, eye drops, creams, and over-the-counter medicines. Any problems you or family members have had with anesthetic medicines. Any bleeding problems you have. Any surgeries you have had. Any medical conditions you have. Any problems you have had with having bowel movements. Whether you are pregnant or may be pregnant. What are the risks? Generally, this is a safe procedure. However, problems may occur, including: Bleeding. Damage to your intestine. Allergic reactions to medicines given during the procedure. Infection. This is rare. What happens before the procedure? Eating and drinking restrictions Follow instructions from your health care provider about eating or drinking restrictions, which may include: A few days before the procedure:  Follow a low-fiber diet. Avoid nuts, seeds, dried fruit, raw fruits, and vegetables. 1-3 days before the procedure: Eat only gelatin dessert or ice pops. Drink only clear liquids, such as water , clear juice, clear broth or bouillon, black coffee or tea, or clear soft drinks or sports drinks. Avoid liquids that contain red or purple dye. The day of the procedure: Do not eat solid foods. You may continue to drink clear liquids until up to 2 hours before the  procedure. Do not eat or drink anything starting 2 hours before the procedure, or within the time period that your health care provider recommends. Bowel prep If you were prescribed a bowel prep to take by mouth (orally) to clean out your colon: Take it as told by your health care provider. Starting the day before your procedure, you will need to drink a large amount of liquid medicine. The liquid will cause you to have many bowel movements of loose stool until your stool becomes almost clear or light green. If your skin or the opening between the buttocks (anus) gets irritated from diarrhea, you may relieve the irritation using: Wipes with medicine in them, such as adult wet wipes with aloe and vitamin E . A product to soothe skin, such as petroleum jelly. If you vomit while drinking the bowel prep: Take a break for up to 60 minutes. Begin the bowel prep again. Call your health care provider if you keep vomiting or you cannot take the bowel prep without vomiting. To clean out your colon, you may also be given: Laxative medicines. These help you have a bowel movement. Instructions for enema use. An enema is liquid medicine injected into your rectum. Medicines Ask your health care provider about: Changing or stopping your regular medicines or supplements. This is especially important if you are taking iron supplements, diabetes medicines, or blood thinners. Taking medicines such as aspirin  and ibuprofen. These medicines can thin your blood. Do not take these medicines unless your health care provider tells you to take them. Taking over-the-counter medicines, vitamins, herbs, and supplements. General instructions Ask your health care provider what steps will be taken to help prevent infection. These may include washing skin with a germ-killing soap. If you will be going home right after the procedure, plan to have a responsible adult: Take you home from the hospital or clinic. You will not be  allowed to drive. Care for you for the time you are told. What happens during the procedure?  An IV will be inserted into one of your veins. You will be given a medicine to make you fall asleep (general anesthetic). You will lie on your side with your knees bent. A lubricant will be put on the tube. Then the tube will be: Inserted into your anus. Gently eased through all parts of your large intestine. Air will be sent into your colon to keep it open. This may cause some pressure or cramping. Images will be taken with the camera and will appear on a screen. A small tissue sample may be removed to be looked at under a microscope (biopsy). The tissue may be sent to a lab for testing if any signs of problems are found. If small polyps are found, they may be removed and checked for cancer cells. When the procedure is finished, the tube will be removed. The procedure may vary among health care providers and hospitals. What happens after the procedure? Your blood pressure, heart rate, breathing rate, and blood oxygen level will be monitored  until you leave the hospital or clinic. You may have a small amount of blood in your stool. You may pass gas and have mild cramping or bloating in your abdomen. This is caused by the air that was used to open your colon during the exam. If you were given a sedative during the procedure, it can affect you for several hours. Do not drive or operate machinery until your health care provider says that it is safe. It is up to you to get the results of your procedure. Ask your health care provider, or the department that is doing the procedure, when your results will be ready. Summary A colonoscopy is a procedure to look at the entire large intestine. Follow instructions from your health care provider about eating and drinking before the procedure. If you were prescribed an oral bowel prep to clean out your colon, take it as told by your health care provider. During  the colonoscopy, a flexible tube with a camera on its end is inserted into the anus and then passed into all parts of the large intestine. This information is not intended to replace advice given to you by your health care provider. Make sure you discuss any questions you have with your health care provider. Document Revised: 07/28/2022 Document Reviewed: 02/05/2021 Elsevier Patient Education  2024 [employer]  Upper Endoscopy, Adult Upper endoscopy is a procedure to look inside the upper GI (gastrointestinal) tract. The upper GI tract is made up of: The esophagus. This is the part of the body that moves food from your mouth to your stomach. The stomach. The duodenum. This is the first part of your small intestine. This procedure is also called esophagogastroduodenoscopy (EGD) or gastroscopy. In this procedure, your health care provider passes a thin, flexible tube (endoscope) through your mouth and down your esophagus into your stomach and into your duodenum. A small camera is attached to the end of the tube. Images from the camera appear on a monitor in the exam room. During this procedure, your health care provider may also remove a small piece of tissue to be sent to a lab and examined under a microscope (biopsy). Your health care provider may do an upper endoscopy to diagnose cancers of the upper GI tract. You may also have this procedure to find the cause of other conditions, such as: Stomach pain. Heartburn. Pain or problems when swallowing. Nausea and vomiting. Stomach bleeding. Stomach ulcers. Tell a health care provider about: Any allergies you have. All medicines you are taking, including vitamins, herbs, eye drops, creams, and over-the-counter medicines. Any problems you or family members have had with anesthetic medicines. Any bleeding problems you have. Any surgeries you have had. Any medical conditions you have. Whether you are pregnant or may be pregnant. What are the  risks? Your healthcare provider will talk with you about risks. These may include: Infection. Bleeding. Allergic reactions to medicines. A tear or hole (perforation) in the esophagus, stomach, or duodenum. What happens before the procedure? When to stop eating and drinking Follow instructions from your health care provider about what you may eat and drink. These may include: 8 hours before your procedure Stop eating most foods. Do not eat meat, fried foods, or fatty foods. Eat only light foods, such as toast or crackers. All liquids are okay except energy drinks and alcohol. 6 hours before your procedure Stop eating. Drink only clear liquids, such as water , clear fruit juice, black coffee, plain tea, and sports drinks. Do not  drink energy drinks or alcohol. 2 hours before your procedure Stop drinking all liquids. You may be allowed to take medicines with small sips of water . If you do not follow your health care provider's instructions, your procedure may be delayed or canceled. Medicines Ask your health care provider about: Changing or stopping your regular medicines. This is especially important if you are taking diabetes medicines or blood thinners. Taking medicines such as aspirin  and ibuprofen. These medicines can thin your blood. Do not take these medicines unless your health care provider tells you to take them. Taking over-the-counter medicines, vitamins, herbs, and supplements. General instructions If you will be going home right after the procedure, plan to have a responsible adult: Take you home from the hospital or clinic. You will not be allowed to drive. Care for you for the time you are told. What happens during the procedure?  An IV will be inserted into one of your veins. You may be given one or more of the following: A medicine to help you relax (sedative). A medicine to numb the throat (local anesthetic). You will lie on your left side on an exam table. Your  health care provider will pass the endoscope through your mouth and down your esophagus. Your health care provider will use the scope to check the inside of your esophagus, stomach, and duodenum. Biopsies may be taken. The endoscope will be removed. The procedure may vary among health care providers and hospitals. What happens after the procedure? Your blood pressure, heart rate, breathing rate, and blood oxygen level will be monitored until you leave the hospital or clinic. When your throat is no longer numb, you may be given some fluids to drink. If you were given a sedative during the procedure, it can affect you for several hours. Do not drive or operate machinery until your health care provider says that it is safe. It is up to you to get the results of your procedure. Ask your health care provider, or the department that is doing the procedure, when your results will be ready. Contact a health care provider if you: Have a sore throat that lasts longer than 1 day. Have a fever. Get help right away if you: Vomit blood or your vomit looks like coffee grounds. Have bloody, black, or tarry stools. Have a very bad sore throat or you cannot swallow. Have difficulty breathing or very bad pain in your chest or abdomen. These symptoms may be an emergency. Get help right away. Call 911. Do not wait to see if the symptoms will go away. Do not drive yourself to the hospital. Summary Upper endoscopy is a procedure to look inside the upper GI tract. During the procedure, an IV will be inserted into one of your veins. You may be given a medicine to help you relax. The endoscope will be passed through your mouth and down your esophagus. Follow instructions from your health care provider about what you can eat and drink. This information is not intended to replace advice given to you by your health care provider. Make sure you discuss any questions you have with your health care provider. Document  Revised: 09/24/2021 Document Reviewed: 09/24/2021 Elsevier Patient Education  2024 [employer]   Monitored Anesthesia Care Anesthesia refers to the techniques, procedures, and medicines that help a person stay safe and comfortable during surgery. Monitored anesthesia care, or sedation, is one type of anesthesia. You may have sedation if you do not need to be asleep for your  procedure. Procedures that use sedation may include: Surgery to remove cataracts from your eyes. A dental procedure. A biopsy. This is when a tissue sample is removed and looked at under a microscope. You will be watched closely during your procedure. Your level of sedation or type of anesthesia may be changed to fit your needs. Tell a health care provider about: Any allergies you have. All medicines you are taking, including vitamins, herbs, eye drops, creams, and over-the-counter medicines. Any problems you or family members have had with anesthesia. Any bleeding problems you have. Any surgeries you have had. Any medical conditions or illnesses you have. This includes sleep apnea, cough, fever, or the flu. Whether you are pregnant or may be pregnant. Whether you use cigarettes, alcohol, or drugs. Any use of steroids, whether by mouth or as a cream. What are the risks? Your health care provider will talk with you about risks. These may include: Getting too much medicine (oversedation). Nausea. Allergic reactions to medicines. Trouble breathing. If this happens, a breathing tube may be used to help you breathe. It will be removed when you are awake and breathing on your own. Heart trouble. Lung trouble. Confusion that gets better with time (emergence delirium). What happens before the procedure? When to stop eating and drinking Follow instructions from your health care provider about what you may eat and drink. These may include: 8 hours before your procedure Stop eating most foods. Do not eat meat, fried  foods, or fatty foods. Eat only light foods, such as toast or crackers. All liquids are okay except energy drinks and alcohol. 6 hours before your procedure Stop eating. Drink only clear liquids, such as water , clear fruit juice, black coffee, plain tea, and sports drinks. Do not drink energy drinks or alcohol. 2 hours before your procedure Stop drinking all liquids. You may be allowed to take medicines with small sips of water . If you do not follow your health care provider's instructions, your procedure may be delayed or canceled. Medicines Ask your health care provider about: Changing or stopping your regular medicines. These include any diabetes medicines or blood thinners you take. Taking medicines such as aspirin  and ibuprofen. These medicines can thin your blood. Do not take them unless your health care provider tells you to. Taking over-the-counter medicines, vitamins, herbs, and supplements. Testing You may have an exam or testing. You may have a blood or urine sample taken. General instructions Do not use any products that contain nicotine or tobacco for at least 4 weeks before the procedure. These products include cigarettes, chewing tobacco, and vaping devices, such as e-cigarettes. If you need help quitting, ask your health care provider. If you will be going home right after the procedure, plan to have a responsible adult: Take you home from the hospital or clinic. You will not be allowed to drive. Care for you for the time you are told. What happens during the procedure?  Your blood pressure, heart rate, breathing, level of pain, and blood oxygen level will be monitored. An IV will be inserted into one of your veins. You may be given: A sedative. This helps you relax. Anesthesia. This will: Numb certain areas of your body. Make you fall asleep for surgery. You will be given medicines as needed to keep you comfortable. The more medicine you are given, the deeper your  level of sedation will be. Your level of sedation may be changed to fit your needs. There are three levels of sedation: Mild sedation. At  this level, you may feel awake and relaxed. You will be able to follow directions. Moderate sedation. At this level, you will be sleepy. You may not remember the procedure. Deep sedation. At this level, you will be asleep. You will not remember the procedure. How you get the medicines will depend on your age and the procedure. They may be given as: A pill. This may be taken by mouth (orally) or inserted into the rectum. An injection. This may be into a vein or muscle. A spray through the nose. After your procedure is over, the medicine will be stopped. The procedure may vary among health care providers and hospitals. What happens after the procedure? Your blood pressure, heart rate, breathing rate, and blood oxygen level will be monitored until you leave the hospital or clinic. You may feel sleepy, clumsy, or nauseous. You may not remember what happened during or after the procedure. Sedation can affect you for several hours. Do not drive or use machinery until your health care provider says that it is safe. This information is not intended to replace advice given to you by your health care provider. Make sure you discuss any questions you have with your health care provider. Document Revised: 11/10/2021 Document Reviewed: 11/10/2021 Elsevier Patient Education  2024 [employer].

## 2023-11-15 ENCOUNTER — Encounter (HOSPITAL_COMMUNITY)
Admission: RE | Admit: 2023-11-15 | Discharge: 2023-11-15 | Disposition: A | Discharge status: Home / Self Care | Source: Ambulatory Visit | Attending: Internal Medicine | Admitting: Internal Medicine

## 2023-11-15 ENCOUNTER — Encounter (HOSPITAL_COMMUNITY): Payer: Self-pay

## 2023-11-15 VITALS — BP 141/50 | HR 93 | Temp 98.2°F | Resp 18 | Ht 60.0 in | Wt 205.7 lb

## 2023-11-15 DIAGNOSIS — I10 Essential (primary) hypertension: Secondary | ICD-10-CM | POA: Insufficient documentation

## 2023-11-15 DIAGNOSIS — Z01810 Encounter for preprocedural cardiovascular examination: Secondary | ICD-10-CM | POA: Diagnosis not present

## 2023-11-17 ENCOUNTER — Ambulatory Visit (HOSPITAL_COMMUNITY)
Admission: RE | Admit: 2023-11-17 | Discharge: 2023-11-17 | Disposition: A | Discharge status: Home / Self Care | Attending: Internal Medicine | Admitting: Internal Medicine

## 2023-11-17 ENCOUNTER — Ambulatory Visit (HOSPITAL_BASED_OUTPATIENT_CLINIC_OR_DEPARTMENT_OTHER): Admitting: Anesthesiology

## 2023-11-17 ENCOUNTER — Encounter (HOSPITAL_COMMUNITY): Payer: Self-pay | Admitting: Internal Medicine

## 2023-11-17 ENCOUNTER — Ambulatory Visit (HOSPITAL_COMMUNITY): Admitting: Anesthesiology

## 2023-11-17 ENCOUNTER — Encounter (HOSPITAL_COMMUNITY)
Admission: RE | Disposition: A | Discharge status: Home / Self Care | Payer: Self-pay | Source: Home / Self Care | Attending: Internal Medicine

## 2023-11-17 DIAGNOSIS — D123 Benign neoplasm of transverse colon: Secondary | ICD-10-CM | POA: Insufficient documentation

## 2023-11-17 DIAGNOSIS — Z79890 Hormone replacement therapy (postmenopausal): Secondary | ICD-10-CM | POA: Diagnosis not present

## 2023-11-17 DIAGNOSIS — R1310 Dysphagia, unspecified: Secondary | ICD-10-CM

## 2023-11-17 DIAGNOSIS — F32A Depression, unspecified: Secondary | ICD-10-CM | POA: Insufficient documentation

## 2023-11-17 DIAGNOSIS — Z6841 Body Mass Index (BMI) 40.0 and over, adult: Secondary | ICD-10-CM | POA: Diagnosis not present

## 2023-11-17 DIAGNOSIS — K222 Esophageal obstruction: Secondary | ICD-10-CM

## 2023-11-17 DIAGNOSIS — D127 Benign neoplasm of rectosigmoid junction: Secondary | ICD-10-CM | POA: Diagnosis not present

## 2023-11-17 DIAGNOSIS — R1314 Dysphagia, pharyngoesophageal phase: Secondary | ICD-10-CM | POA: Diagnosis not present

## 2023-11-17 DIAGNOSIS — K640 First degree hemorrhoids: Secondary | ICD-10-CM

## 2023-11-17 DIAGNOSIS — Z1211 Encounter for screening for malignant neoplasm of colon: Secondary | ICD-10-CM | POA: Diagnosis not present

## 2023-11-17 DIAGNOSIS — E039 Hypothyroidism, unspecified: Secondary | ICD-10-CM | POA: Insufficient documentation

## 2023-11-17 DIAGNOSIS — K635 Polyp of colon: Secondary | ICD-10-CM | POA: Diagnosis not present

## 2023-11-17 DIAGNOSIS — D122 Benign neoplasm of ascending colon: Secondary | ICD-10-CM

## 2023-11-17 DIAGNOSIS — E66813 Obesity, class 3: Secondary | ICD-10-CM | POA: Insufficient documentation

## 2023-11-17 DIAGNOSIS — Z860101 Personal history of adenomatous and serrated colon polyps: Secondary | ICD-10-CM | POA: Diagnosis not present

## 2023-11-17 DIAGNOSIS — I2510 Atherosclerotic heart disease of native coronary artery without angina pectoris: Secondary | ICD-10-CM | POA: Diagnosis not present

## 2023-11-17 DIAGNOSIS — F419 Anxiety disorder, unspecified: Secondary | ICD-10-CM | POA: Insufficient documentation

## 2023-11-17 DIAGNOSIS — Z8673 Personal history of transient ischemic attack (TIA), and cerebral infarction without residual deficits: Secondary | ICD-10-CM | POA: Diagnosis not present

## 2023-11-17 DIAGNOSIS — I10 Essential (primary) hypertension: Secondary | ICD-10-CM | POA: Insufficient documentation

## 2023-11-17 HISTORY — PX: ESOPHAGEAL DILATION: SHX303

## 2023-11-17 HISTORY — PX: COLONOSCOPY: SHX5424

## 2023-11-17 HISTORY — PX: ESOPHAGOGASTRODUODENOSCOPY: SHX5428

## 2023-11-17 SURGERY — COLONOSCOPY
Anesthesia: General

## 2023-11-17 MED ORDER — PROPOFOL 10 MG/ML IV BOLUS
INTRAVENOUS | Status: DC | PRN
  Administered 2023-11-17: 50 mg via INTRAVENOUS

## 2023-11-17 MED ORDER — LACTATED RINGERS IV SOLN: INTRAVENOUS | Status: DC

## 2023-11-17 MED ORDER — LACTATED RINGERS IV SOLN: INTRAVENOUS | Status: DC | PRN

## 2023-11-17 MED ORDER — PROPOFOL 500 MG/50ML IV EMUL
INTRAVENOUS | Status: DC | PRN
Start: 2023-11-17 — End: 2023-11-17
  Administered 2023-11-17: 150 ug/kg/min via INTRAVENOUS

## 2023-11-17 MED ORDER — LIDOCAINE 2% (20 MG/ML) 5 ML SYRINGE
INTRAMUSCULAR | Status: DC | PRN
  Administered 2023-11-17: 100 mg via INTRAVENOUS

## 2023-11-17 NOTE — Op Note (Signed)
 [facility] Patient Name: Kristina Mcdonald Procedure Date: 11/17/2023 10:03 AM MRN: [id number] Date of Birth: 1955-11-01 Attending MD: Gemma Kelp , MD, [id number] CSN: [id number] Age: 68 Admit Type: Outpatient Procedure:                Colonoscopy Indications:              High risk colon cancer surveillance: Personal                            history of colonic polyps Providers:                Gemma Kelp, MD, Alisa App, Crystal                            Page, Annell Barrow Referring MD:              Medicines:                Propofol per Anesthesia Complications:            No immediate complications. Estimated Blood Loss:     Estimated blood loss was minimal. Procedure:                Pre-Anesthesia Assessment:                           - Prior to the procedure, a History and Physical                            was performed, and patient medications and                            allergies were reviewed. The patient's tolerance of                            previous anesthesia was also reviewed. The risks                            and benefits of the procedure and the sedation                            options and risks were discussed with the patient.                            All questions were answered, and informed consent                            was obtained. Anticoagulants: The patient has taken                            anticoagulant medication. It was decided not to                            withhold this medication prior to procedure. ASA  Grade Assessment: III - A patient with severe                            systemic disease. After reviewing the risks and                            benefits, the patient was deemed in satisfactory                            condition to undergo the procedure.                           After obtaining informed consent, the colonoscope                            was passed  under direct vision. Throughout the                            procedure, the patient's blood pressure, pulse, and                            oxygen saturations were monitored continuously. The                            [id number]) scope was introduced through the                            anus and advanced to the the cecum, identified by                            appendiceal orifice and ileocecal valve. The                            colonoscopy was performed without difficulty. The                            patient tolerated the procedure well. The quality                            of the bowel preparation was adequate. The                            colonoscopy was performed without difficulty. The                            patient tolerated the procedure well. Anatomical                            landmarks were photographed. Scope In: 10:42:10 AM Scope Out: 11:00:53 AM Scope Withdrawal Time: 0 hours 8 minutes 48 seconds  Total Procedure Duration: 0 hours 18 minutes 43 seconds  Findings:      The perianal and digital rectal examinations were normal.      A 5 mm polyp was found in the splenic flexure. The polyp  was sessile.       The polyp was removed with a cold snare. Resection and retrieval were       complete. Estimated blood loss was minimal.      Two polyps were found in the ascending colon. The polyps were 12 mm in       size (ascending) and 8 mm in the rectosigmoid. Both hot snared and       recovered. .      The exam was otherwise without abnormality on direct and retroflexion       views.      Non-bleeding internal hemorrhoids were found during retroflexion. The       hemorrhoids were mild, small and Grade I (internal hemorrhoids that do       not prolapse). Impression:               - One 5 mm polyp at the splenic flexure, removed                            with a cold snare. Resected and retrieved.                           - Two 12 mm polyps in the  ascending colon. resected                            and retrieved. diminutive rectosigmoid polyp ablated                           - The examination was otherwise normal on direct                            and retroflexion views. Moderate Sedation:      Moderate (conscious) sedation was personally administered by an       anesthesia professional. The following parameters were monitored: oxygen       saturation, heart rate, blood pressure, respiratory rate, EKG, adequacy       of pulmonary ventilation, and response to care. Recommendation:           - Repeat colonoscopy date to be determined after                            pending pathology results are reviewed for                            surveillance based on pathology results.                           - Return to GI office (date not yet determined).                           - Patient has a contact number available for                            emergencies. The signs and symptoms of potential  delayed complications were discussed with the                            patient. Return to normal activities tomorrow.                            Written discharge instructions were provided to the                            patient.                           - Advance diet as tolerated.                           - Continue present medications. Procedure Code(s):        --- Professional ---                           [phone], Colonoscopy, flexible; with removal of                            tumor(s), polyp(s), or other lesion(s) by snare                            technique Diagnosis Code(s):        --- Professional ---                           Z86.010, Personal history of colonic polyps                           D12.3, Benign neoplasm of transverse colon (hepatic                            flexure or splenic flexure)                           D12.2, Benign neoplasm of ascending colon CPT copyright 2022 American  Medical Association. All rights reserved. The codes documented in this report are preliminary and upon coder review may  be revised to meet current compliance requirements. Windsor Hatcher. Handy Mcloud, MD Gemma Kelp, MD 11/17/2023 11:17:32 AM This report has been signed electronically. Number of Addenda: 0

## 2023-11-17 NOTE — Anesthesia Preprocedure Evaluation (Addendum)
 Anesthesia Evaluation  Patient identified by MRN, date of birth, ID band Patient awake    Reviewed: Allergy & Precautions, H&P , NPO status , Patient's Chart, lab work & pertinent test results  Airway Mallampati: II  TM Distance: >3 FB Neck ROM: Full    Dental no notable dental hx.    Pulmonary neg pulmonary ROS   Pulmonary exam normal breath sounds clear to auscultation       Cardiovascular hypertension, (-) CAD Normal cardiovascular exam Rhythm:Regular Rate:Normal     Neuro/Psych  PSYCHIATRIC DISORDERS Anxiety Depression    CVA 2013 CVA, No Residual Symptoms    GI/Hepatic negative GI ROS, Neg liver ROS,,,  Endo/Other  Hypothyroidism  Class 3 obesity  Renal/GU negative Renal ROS  negative genitourinary   Musculoskeletal negative musculoskeletal ROS (+)    Abdominal  (+) + obese  Peds negative pediatric ROS (+)  Hematology negative hematology ROS (+)   Anesthesia Other Findings   Reproductive/Obstetrics negative OB ROS                             Anesthesia Physical Anesthesia Plan  ASA: 2  Anesthesia Plan: General   Post-op Pain Management:    Induction: Intravenous  PONV Risk Score and Plan:   Airway Management Planned: Nasal Cannula  Additional Equipment:   Intra-op Plan:   Post-operative Plan: Extubation in OR  Informed Consent: I have reviewed the patients History and Physical, chart, labs and discussed the procedure including the risks, benefits and alternatives for the proposed anesthesia with the patient or authorized representative who has indicated his/her understanding and acceptance.     Dental advisory given  Plan Discussed with: CRNA  Anesthesia Plan Comments:        Anesthesia Quick Evaluation

## 2023-11-17 NOTE — Op Note (Addendum)
 [facility] Patient Name: Kristina Mcdonald Procedure Date: 11/17/2023 10:05 AM MRN: [id number] Date of Birth: 1955/10/06 Attending MD: Gemma Kelp , MD, [id number] CSN: [id number] Age: 68 Admit Type: Outpatient Procedure:                Upper GI endoscopy Indications:              Dysphagia Providers:                Gemma Kelp, MD, Alisa App, Crystal                            Page, Annell Barrow Referring MD:              Medicines:                Propofol per Anesthesia Complications:            No immediate complications. Estimated Blood Loss:     Estimated blood loss was minimal. Procedure:                Pre-Anesthesia Assessment:                           - Prior to the procedure, a History and Physical                            was performed, and patient medications and                            allergies were reviewed. The patient's tolerance of                            previous anesthesia was also reviewed. The risks                            and benefits of the procedure and the sedation                            options and risks were discussed with the patient.                            All questions were answered, and informed consent                            was obtained. Prior Anticoagulants: The patient has                            taken no anticoagulant or antiplatelet agents. ASA                            Grade Assessment: III - A patient with severe                            systemic disease. After reviewing the risks and  benefits, the patient was deemed in satisfactory                            condition to undergo the procedure.                           After obtaining informed consent, the endoscope was                            passed under direct vision. Throughout the                            procedure, the patient's blood pressure, pulse, and                            oxygen saturations  were monitored continuously. The                            [id number] ([id number]) scope was introduced through the                            mouth, and advanced to the third part of duodenum.                            The upper GI endoscopy was accomplished without                            difficulty. The patient tolerated the procedure                            well. Scope In: 10:29:26 AM Scope Out: 10:36:48 AM Total Procedure Duration: 0 hours 7 minutes 22 seconds  Findings:      A mild Schatzki ring was found at the gastroesophageal junction. . No       esophagitis. Nonobstructing ring. The scope was withdrawn. Dilation was       performed with a Maloney dilator with mild resistance at 54 Fr. The       scope was withdrawn. Dilation was performed with a Maloney dilator with       mild resistance at 56 Fr. The dilation site was examined following       endoscope reinsertion and showed mild mucosal disruption. Estimated       blood loss was minimal.      The entire examined stomach was normal.      The duodenal bulb and second portion of the duodenum were normal. Impression:               - Mild Schatzki ring. Dilated.                           - Normal stomach.                           - Normal duodenal bulb and second portion of the  duodenum.                           - No specimens collected. Moderate Sedation:      Moderate (conscious) sedation was personally administered by an       anesthesia professional. The following parameters were monitored: oxygen       saturation, heart rate, blood pressure, respiratory rate, EKG, adequacy       of pulmonary ventilation, and response to care. Recommendation:           - Patient has a contact number available for                            emergencies. The signs and symptoms of potential                            delayed complications were discussed with the                            patient. Return to  normal activities tomorrow.                            Written discharge instructions were provided to the                            patient.                           - Advance diet as tolerated.                           - Continue present medications. See colonoscopy                            report. Procedure Code(s):        --- Professional ---                           [phone], Esophagogastroduodenoscopy, flexible,                            transoral; diagnostic, including collection of                            specimen(s) by brushing or washing, when performed                            (separate procedure)                           43450, Dilation of esophagus, by unguided sound or                            bougie, single or multiple passes Diagnosis Code(s):        --- Professional ---  K22.2, Esophageal obstruction                           R13.10, Dysphagia, unspecified CPT copyright 2022 American Medical Association. All rights reserved. The codes documented in this report are preliminary and upon coder review may  be revised to meet current compliance requirements. Windsor Hatcher. Lemon Sternberg, MD Gemma Kelp, MD 11/17/2023 11:05:22 AM This report has been signed electronically. Number of Addenda: 0

## 2023-11-17 NOTE — H&P (Signed)
 @LOGO @   Primary Care Physician:  Minus Amel, MD Primary Gastroenterologist:  Dr. Riley Cheadle  Pre-Procedure History & Physical: HPI:  Kristina Mcdonald is a 68 y.o. female here for  further evaluation management of esophageal dysphagia via EGD.  History of multiple colonic adenomas removed 2015 here for surveillance colonoscopy as well.  Past Medical History:  Diagnosis Date   BV (bacterial vaginosis) 11/22/2013   CAD (coronary artery disease) 02/2001   Nonobstructive per cath.   Chronic anxiety    Hyperlipidemia    Hypertension    Hypothyroidism 10/2006   Status post thyroidectomy for thyroid  mass.   Insomnia 05/10/2013   Internal hemorrhoids 09/2003   LLQ pain 10/09/2014   Overactive bladder    Seasonal allergies    Stress 05/10/2013   Stroke (HCC)    Thrombocytopenia (HCC)    Chronic   Thrush, oral 11/22/2013   Tubular adenoma    Vaginal discharge 11/22/2013   Vaginal odor 10/09/2014    Past Surgical History:  Procedure Laterality Date   ABDOMINAL HYSTERECTOMY     APPENDECTOMY  11/2002.   Incidental during laparotomy and excision of ovarian remnant.   CARDIAC CATHETERIZATION  02/2001   Nonobstructive CAD   COLONOSCOPY  09/2003   Per Dr. Riley Cheadle. Internal hemorrhoids.normal otherwise   COLONOSCOPY N/A 12/04/2013   Dr. Riley Cheadle- normal rectum aside from minimal internal hemorrhoids. the patient had a 6mm polyp in the mid ascending segment and an 8mm poylp in the mid descending segment o/w the remaider of the colonic mucosa appeared normal. the distal 10cm of terminal mucosa also appeared normal.tubular adenoma on bx   ESOPHAGOGASTRODUODENOSCOPY N/A 12/04/2013   Dr.Acxel Dingee- distal esophageal erosions w/i 5mm of the GE junction. no basrrett's esophagus. tubular esophagus patent throughout its course. focal antral erosions o/w normal appearing gastric mucosa. patent pylorus, normal first and second portion of the duodenum. mild chronic inflammation on bx   INNER EAR SURGERY     ear drum    LAPAROTOMY  11/2002   Excision of ovarian remnant   THYROIDECTOMY  10/2006   Neg for cancer per patient   TOTAL ABDOMINAL HYSTERECTOMY W/ BILATERAL SALPINGOOPHORECTOMY  09/2001.    Prior to Admission medications   Medication Sig Start Date End Date Taking? Authorizing Provider  ALPRAZolam  (XANAX ) 0.5 MG tablet Take 0.5 mg by mouth 4 (four) times daily as needed. 02/18/21  Yes [provider]  amLODipine  (NORVASC ) 5 MG tablet Take 1 tablet (5 mg total) by mouth daily. 06/21/23  Yes Minus Amel, MD  Ascorbic Acid  (VITAMIN C ) 100 MG tablet Take 100 mg by mouth daily.   Yes [provider]  aspirin  EC 81 MG tablet Take 81 mg by mouth daily. 09/05/15  Yes [provider]  atorvastatin  (LIPITOR) 80 MG tablet Take 1 tablet (80 mg total) by mouth at bedtime. 03/12/23  Yes Jackson, Samantha J, PA-C  buPROPion (WELLBUTRIN XL) 150 MG 24 hr tablet Take 150 mg by mouth daily. 06/28/23  Yes [provider]  cetirizine (ZYRTEC) 10 MG tablet Take 10 mg by mouth at bedtime.    Yes [provider]  cholecalciferol (VITAMIN D3) 25 MCG (1000 UNIT) tablet Take 1,000 Units by mouth daily.   Yes [provider]  clobetasol  ointment (TEMOVATE ) 0.05 % USE TWICE A DAY FOR TWO WEEKS THEN TWO TIMES WEEKLY 10/07/23  Yes Lendia Quay A, NP  cyanocobalamin  (VITAMIN B12) 1000 MCG tablet Take 1,000 mcg by mouth daily. 09/05/15  Yes [provider]  dicyclomine  (BENTYL ) 20 MG tablet Take 20 mg by mouth 3 (three) times daily before meals. 07/24/15  Yes [provider]  escitalopram  (LEXAPRO ) 20 MG tablet Take 1 tablet (20 mg total) by mouth daily. 06/28/23  Yes Minus Amel, MD  estradiol  (ESTRACE ) 0.1 MG/GM vaginal cream INSERT A BLUEBERRY SIZED AMOUNT OF CREAM (1GM) ON FINGERTIP INSIDE OF VAGINA AT BEDTIME EVERY NIGHT 10/07/23  Yes Lauretta Ponto, FNP  ezetimibe  (ZETIA ) 10 MG tablet Take 1 tablet (10 mg total) by mouth daily. 06/21/23  Yes Kathyleen Parkins,  MD  fluconazole (DIFLUCAN) 100 MG tablet Take 100 mg by mouth daily.   Yes [provider]  levofloxacin (LEVAQUIN) 500 MG tablet Take 500 mg by mouth daily.   Yes [provider]  levothyroxine  (SYNTHROID ) 100 MCG tablet Take 1 tablet (100 mcg total) by mouth daily. 08/06/23  Yes Kathyleen Parkins, MD  mirtazapine  (REMERON ) 30 MG tablet Take 1 tablet (30 mg total) by mouth daily. 08/06/23  Yes Kathyleen Parkins, MD  olmesartan -hydrochlorothiazide  (BENICAR  HCT) 20-12.5 MG tablet Take 1 tablet by mouth daily. 06/21/23  Yes Minus Amel, MD  pantoprazole  (PROTONIX ) 40 MG tablet Take 1 tablet (40 mg total) by mouth daily. 03/12/23  Yes Jackson, Samantha J, PA-C  trimethoprim  (TRIMPEX ) 100 MG tablet Take 1 tablet (100 mg total) by mouth daily. 07/19/23  Yes Lauretta Ponto, FNP  Vibegron  (GEMTESA ) 75 MG TABS Take 1 tablet (75 mg total) by mouth daily. 08/25/23  Yes Lauretta Ponto, FNP  vitamin E  200 UNIT capsule Take 200 Units by mouth daily.   Yes [provider]    Allergies as of 10/07/2023   (No Known Allergies)    Family History  Problem Relation Age of Onset   Heart disease Mother        CHF   Cancer Father        prostate   Stroke Brother    Stroke Sister    Heart disease Sister        CHF   Multiple sclerosis Sister    Stroke Daughter        light strokes   Heart attack Sister    Colon cancer Neg Hx     Social History   Socioeconomic History   Marital status: Married    Spouse name: Not on file   Number of children: Not on file   Years of education: Not on file   Highest education level: Not on file  Occupational History   Occupation: Administrator, arts: [employer]  Tobacco Use   Smoking status: Never   Smokeless tobacco: Never   Tobacco comments:    Never smoker  Vaping Use   Vaping status: Never Used  Substance and Sexual Activity   Alcohol use: No   Drug use: No   Sexual activity: Not Currently    Birth  control/protection: Surgical    Comment: hyst  Other Topics Concern   Not on file  Social History Narrative   Not on file   Social Drivers of Health   Financial Resource Strain: Low Risk  (03/19/2023)   Overall Financial Resource Strain (CARDIA)    Difficulty of Paying Living Expenses: Not hard at all  Food Insecurity: No Food Insecurity (03/19/2023)   Hunger Vital Sign    Worried About Running Out of Food in the Last Year: Never true    Ran Out of Food in the Last Year: Never true  Transportation Needs: No  Transportation Needs (03/19/2023)   PRAPARE - Administrator, Civil Service (Medical): No    Lack of Transportation (Non-Medical): No  Physical Activity: Insufficiently Active (03/19/2023)   Exercise Vital Sign    Days of Exercise per Week: 1 day    Minutes of Exercise per Session: 10 min  Stress: Stress Concern Present (03/19/2023)   [employer] of Occupational Health - Occupational Stress Questionnaire    Feeling of Stress : Very much  Social Connections: Moderately Integrated (03/19/2023)   Social Connection and Isolation Panel [NHANES]    Frequency of Communication with Friends and Family: More than three times a week    Frequency of Social Gatherings with Friends and Family: Once a week    Attends Religious Services: 1 to 4 times per year    Active Member of [employer] or Organizations: No    Attends Banker Meetings: Never    Marital Status: Married  Catering manager Violence: Not At Risk (03/19/2023)   Humiliation, Afraid, Rape, and Kick questionnaire    Fear of Current or Ex-Partner: No    Emotionally Abused: No    Physically Abused: No    Sexually Abused: No    Review of Systems: See HPI, otherwise negative ROS  Physical Exam: BP (!) 115/95 (BP Location: Right Arm)   Temp 98.7 F (37.1 C)   Resp 20   Ht 5' (1.524 m)   Wt 93.3 kg   SpO2 96%   BMI 40.17 kg/m  General:   Alert,  Well-developed, well-nourished, pleasant and cooperative  in NAD Mouth:  No deformity or lesions. Neck:  Supple; no masses or thyromegaly. No significant cervical adenopathy. Lungs:  Clear throughout to auscultation.   No wheezes, crackles, or rhonchi. No acute distress. Heart:  Regular rate and rhythm; no murmurs, clicks, rubs,  or gallops. Abdomen: Non-distended, normal bowel sounds.  Soft and nontender without appreciable mass or hepatosplenomegaly.   Impression/Plan:    68 year old lady with recurrent esophageal dysphagia enjoyed improvement from empiric dilation several years ago.  Now with recurrence.  Also, history multiple colonic adenomas here for surveillance colonoscopy. The risks, benefits, limitations, imponderables and alternatives regarding both EGD and colonoscopy have been reviewed with the patient. Questions have been answered. All parties agreeable.       Notice: This dictation was prepared with Dragon dictation along with smaller phrase technology. Any transcriptional errors that result from this process are unintentional and may not be corrected upon review.

## 2023-11-17 NOTE — Transfer of Care (Addendum)
 Immediate Anesthesia Transfer of Care Note  Patient: Kristina Mcdonald  Procedure(s) Performed: COLONOSCOPY EGD (ESOPHAGOGASTRODUODENOSCOPY) DILATION, ESOPHAGUS  Patient Location: Short Stay  Anesthesia Type:General  Level of Consciousness: awake and patient cooperative  Airway & Oxygen Therapy: Patient Spontanous Breathing and Patient connected to nasal cannula oxygen  Post-op Assessment: Report given to RN and Post -op Vital signs reviewed and stable  Post vital signs: Reviewed and stable  Last Vitals:  Vitals Value Taken Time  BP 118/69 11/17/23 1105  Temp 36.7 C 11/17/23 1105  Pulse 97 11/17/23 1105  Resp 23 11/17/23 1105  SpO2 96 % 11/17/23 1105    Last Pain:  Vitals:   11/17/23 1105  TempSrc: Oral  PainSc: 0-No pain         Complications: No notable events documented.

## 2023-11-17 NOTE — Discharge Instructions (Addendum)
 EGD Discharge instructions Please read the instructions outlined below and refer to this sheet in the next few weeks. These discharge instructions provide you with general information on caring for yourself after you leave the hospital. Your doctor may also give you specific instructions. While your treatment has been planned according to the most current medical practices available, unavoidable complications occasionally occur. If you have any problems or questions after discharge, please call your doctor. ACTIVITY You may resume your regular activity but move at a slower pace for the next 24 hours.  Take frequent rest periods for the next 24 hours.  Walking will help expel (get rid of) the air and reduce the bloated feeling in your abdomen.  No driving for 24 hours (because of the anesthesia (medicine) used during the test).  You may shower.  Do not sign any important legal documents or operate any machinery for 24 hours (because of the anesthesia used during the test).  NUTRITION Drink plenty of fluids.  You may resume your normal diet.  Begin with a light meal and progress to your normal diet.  Avoid alcoholic beverages for 24 hours or as instructed by your caregiver.  MEDICATIONS You may resume your normal medications unless your caregiver tells you otherwise.  WHAT YOU CAN EXPECT TODAY You may experience abdominal discomfort such as a feeling of fullness or "gas" pains.  FOLLOW-UP Your doctor will discuss the results of your test with you.  SEEK IMMEDIATE MEDICAL ATTENTION IF ANY OF THE FOLLOWING OCCUR: Excessive nausea (feeling sick to your stomach) and/or vomiting.  Severe abdominal pain and distention (swelling).  Trouble swallowing.  Temperature over 101 F (37.8 C).  Rectal bleeding or vomiting of blood.    . Colonoscopy Discharge Instructions  Read the instructions outlined below and refer to this sheet in the next few weeks. These discharge instructions provide you with  general information on caring for yourself after you leave the hospital. Your doctor may also give you specific instructions. While your treatment has been planned according to the most current medical practices available, unavoidable complications occasionally occur. If you have any problems or questions after discharge, call Dr. Riley Cheadle at [phone]. ACTIVITY You may resume your regular activity, but move at a slower pace for the next 24 hours.  Take frequent rest periods for the next 24 hours.  Walking will help get rid of the air and reduce the bloated feeling in your belly (abdomen).  No driving for 24 hours (because of the medicine (anesthesia) used during the test).   Do not sign any important legal documents or operate any machinery for 24 hours (because of the anesthesia used during the test).  NUTRITION Drink plenty of fluids.  You may resume your normal diet as instructed by your doctor.  Begin with a light meal and progress to your normal diet. Heavy or fried foods are harder to digest and may make you feel sick to your stomach (nauseated).  Avoid alcoholic beverages for 24 hours or as instructed.  MEDICATIONS You may resume your normal medications unless your doctor tells you otherwise.  WHAT YOU CAN EXPECT TODAY Some feelings of bloating in the abdomen.  Passage of more gas than usual.  Spotting of blood in your stool or on the toilet paper.  IF YOU HAD POLYPS REMOVED DURING THE COLONOSCOPY: No aspirin  products for 7 days or as instructed.  No alcohol for 7 days or as instructed.  Eat a soft diet for the next 24 hours.  FINDING OUT THE RESULTS OF YOUR TEST Not all test results are available during your visit. If your test results are not back during the visit, make an appointment with your caregiver to find out the results. Do not assume everything is normal if you have not heard from your caregiver or the medical facility. It is important for you to follow up on all of your test  results.  SEEK IMMEDIATE MEDICAL ATTENTION IF: You have more than a spotting of blood in your stool.  Your belly is swollen (abdominal distention).  You are nauseated or vomiting.  You have a temperature over 101.  You have abdominal pain or discomfort that is severe or gets worse throughout the day.        Multiple polyps found and removed today  Your esophagus was stretched today  Further recommendations to follow pending review of pathology report  Office visit with us  in 3 months  At patient request, I called Jeralyn Mon at [phone] findings and recommendations

## 2023-11-17 NOTE — Anesthesia Postprocedure Evaluation (Signed)
 Anesthesia Post Note  Patient: Kristina Mcdonald  Procedure(s) Performed: COLONOSCOPY EGD (ESOPHAGOGASTRODUODENOSCOPY) DILATION, ESOPHAGUS  Patient location during evaluation: PACU Anesthesia Type: General Level of consciousness: awake and alert Pain management: pain level controlled Vital Signs Assessment: post-procedure vital signs reviewed and stable Respiratory status: spontaneous breathing, nonlabored ventilation, respiratory function stable and patient connected to nasal cannula oxygen Cardiovascular status: stable and blood pressure returned to baseline Postop Assessment: no apparent nausea or vomiting Anesthetic complications: no  No notable events documented.   Last Vitals:  Vitals:   11/17/23 0913 11/17/23 1105  BP: (!) 115/95 118/69  Pulse:  97  Resp: 20 (!) 23  Temp: 37.1 C 36.7 C  SpO2: 96% 96%    Last Pain:  Vitals:   11/17/23 1105  TempSrc: Oral  PainSc: 0-No pain                 Beacher Limerick

## 2023-11-18 ENCOUNTER — Encounter (HOSPITAL_COMMUNITY): Payer: Self-pay | Admitting: Internal Medicine

## 2023-11-19 LAB — SURGICAL PATHOLOGY

## 2023-11-23 ENCOUNTER — Ambulatory Visit: Payer: Self-pay | Admitting: Internal Medicine

## 2024-01-16 ENCOUNTER — Other Ambulatory Visit: Payer: Self-pay | Admitting: Urology

## 2024-01-16 DIAGNOSIS — N390 Urinary tract infection, site not specified: Secondary | ICD-10-CM

## 2024-01-17 DIAGNOSIS — B9689 Other specified bacterial agents as the cause of diseases classified elsewhere: Secondary | ICD-10-CM | POA: Diagnosis not present

## 2024-01-17 DIAGNOSIS — Z6841 Body Mass Index (BMI) 40.0 and over, adult: Secondary | ICD-10-CM | POA: Diagnosis not present

## 2024-01-17 DIAGNOSIS — K130 Diseases of lips: Secondary | ICD-10-CM | POA: Diagnosis not present

## 2024-01-17 DIAGNOSIS — G9332 Myalgic encephalomyelitis/chronic fatigue syndrome: Secondary | ICD-10-CM | POA: Diagnosis not present

## 2024-02-10 DIAGNOSIS — M545 Low back pain, unspecified: Secondary | ICD-10-CM | POA: Diagnosis not present

## 2024-02-10 DIAGNOSIS — N390 Urinary tract infection, site not specified: Secondary | ICD-10-CM | POA: Diagnosis not present

## 2024-02-10 DIAGNOSIS — Z6841 Body Mass Index (BMI) 40.0 and over, adult: Secondary | ICD-10-CM | POA: Diagnosis not present

## 2024-03-23 ENCOUNTER — Ambulatory Visit: Admitting: Urology

## 2024-04-26 ENCOUNTER — Encounter: Payer: Self-pay | Admitting: Adult Health

## 2024-04-26 ENCOUNTER — Ambulatory Visit: Admitting: Adult Health

## 2024-04-26 ENCOUNTER — Other Ambulatory Visit (HOSPITAL_COMMUNITY): Payer: Self-pay | Admitting: Family Medicine

## 2024-04-26 VITALS — BP 132/78 | HR 86 | Ht 60.0 in | Wt 214.0 lb

## 2024-04-26 DIAGNOSIS — Z1331 Encounter for screening for depression: Secondary | ICD-10-CM | POA: Diagnosis not present

## 2024-04-26 DIAGNOSIS — R232 Flushing: Secondary | ICD-10-CM

## 2024-04-26 DIAGNOSIS — Z01419 Encounter for gynecological examination (general) (routine) without abnormal findings: Secondary | ICD-10-CM

## 2024-04-26 DIAGNOSIS — Z90721 Acquired absence of ovaries, unilateral: Secondary | ICD-10-CM | POA: Diagnosis not present

## 2024-04-26 DIAGNOSIS — Z90710 Acquired absence of both cervix and uterus: Secondary | ICD-10-CM | POA: Diagnosis not present

## 2024-04-26 DIAGNOSIS — Z1231 Encounter for screening mammogram for malignant neoplasm of breast: Secondary | ICD-10-CM
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
## 2024-05-09 ENCOUNTER — Other Ambulatory Visit: Payer: Self-pay

## 2024-05-09 DIAGNOSIS — N390 Urinary tract infection, site not specified: Secondary | ICD-10-CM

## 2024-05-09 MED ORDER — TRIMETHOPRIM 100 MG PO TABS
100.0000 mg | ORAL_TABLET | Freq: Every day | ORAL | 0 refills | Status: DC
Start: 2024-05-09 — End: 2024-05-24

## 2024-05-10 ENCOUNTER — Ambulatory Visit (HOSPITAL_COMMUNITY)
Admission: RE | Admit: 2024-05-10 | Discharge: 2024-05-10 | Disposition: A | Discharge status: Home / Self Care | Source: Ambulatory Visit | Attending: Family Medicine | Admitting: Family Medicine

## 2024-05-10 ENCOUNTER — Encounter (HOSPITAL_COMMUNITY): Payer: Self-pay

## 2024-05-10 DIAGNOSIS — Z1231 Encounter for screening mammogram for malignant neoplasm of breast: Secondary | ICD-10-CM | POA: Diagnosis not present

## 2024-05-24 ENCOUNTER — Encounter: Payer: Self-pay | Admitting: Urology

## 2024-05-24 ENCOUNTER — Ambulatory Visit: Admitting: Urology

## 2024-05-24 VITALS — BP 134/77 | HR 96

## 2024-05-24 DIAGNOSIS — N390 Urinary tract infection, site not specified: Secondary | ICD-10-CM

## 2024-05-24 DIAGNOSIS — Z87440 Personal history of urinary (tract) infections: Secondary | ICD-10-CM | POA: Diagnosis not present

## 2024-05-24 DIAGNOSIS — N3281 Overactive bladder: Secondary | ICD-10-CM | POA: Diagnosis not present

## 2024-05-24 MED ORDER — TRIMETHOPRIM 100 MG PO TABS
100.0000 mg | ORAL_TABLET | Freq: Every day | ORAL | 11 refills | Status: AC
Start: 2024-05-24 — End: ?

## 2024-05-24 MED ORDER — MIRABEGRON ER 25 MG PO TB24: 25.0000 mg | ORAL_TABLET | Freq: Every day | ORAL | 11 refills | Status: DC

## 2024-05-24 NOTE — Patient Instructions (Signed)

## 2024-05-24 NOTE — Progress Notes (Signed)
 05/24/2024 11:21 AM   Kristina Mcdonald [birth year] [id number]  Referring provider: Marvine Rush, MD [address] [place],  [place] [address]  Followup frequent UTI and OAB   HPI: Kristina Mcdonald is a 68yo here for followup for frequent UTIs and OAB. She has chronic right lower back pain. She has issues with both SUI and urge urinary incontinence. She was tried gemtesa  and oxybutynin which failed to improve her urge urinary incontinence. She has had 2 UTIs since last visit. She is trimethoprim  prophylaxis. She only wears pads when she is outside the house and the pads are soaked.    PMH: Past Medical History:  Diagnosis Date   BV (bacterial vaginosis) 11/22/2013   CAD (coronary artery disease) 02/2001   Nonobstructive per cath.   Chronic anxiety    Hyperlipidemia    Hypertension    Hypothyroidism 10/2006   Status post thyroidectomy for thyroid  mass.   Insomnia 05/10/2013   Internal hemorrhoids 09/2003   LLQ pain 10/09/2014   Overactive bladder    Seasonal allergies    Stress 05/10/2013   Stroke (HCC)    Thrombocytopenia    Chronic   Thrush, oral 11/22/2013   Tubular adenoma    Vaginal discharge 11/22/2013   Vaginal odor 10/09/2014    Surgical History: Past Surgical History:  Procedure Laterality Date   ABDOMINAL HYSTERECTOMY     APPENDECTOMY  11/2002.   Incidental during laparotomy and excision of ovarian remnant.   CARDIAC CATHETERIZATION  02/2001   Nonobstructive CAD   COLONOSCOPY  09/2003   Per Dr. Shaaron. Internal hemorrhoids.normal otherwise   COLONOSCOPY N/A 12/04/2013   Dr. Shaaron- normal rectum aside from minimal internal hemorrhoids. the patient had a 6mm polyp in the mid ascending segment and an 8mm poylp in the mid descending segment o/w the remaider of the colonic mucosa appeared normal. the distal 10cm of terminal mucosa also appeared normal.tubular adenoma on bx   COLONOSCOPY N/A 11/17/2023   Procedure: COLONOSCOPY;  Surgeon: Shaaron Lamar CHRISTELLA, MD;  [facility];  Service: Endoscopy;  Laterality: N/A;  11:00 am, asa 3   ESOPHAGEAL DILATION N/A 11/17/2023   Procedure: DILATION, ESOPHAGUS;  Surgeon: Shaaron Lamar CHRISTELLA, MD;  Location: [facility];  Service: Endoscopy;  Laterality: N/A;   ESOPHAGOGASTRODUODENOSCOPY N/A 12/04/2013   Dr.Rourk- distal esophageal erosions w/i 5mm of the GE junction. no basrrett's esophagus. tubular esophagus patent throughout its course. focal antral erosions o/w normal appearing gastric mucosa. patent pylorus, normal first and second portion of the duodenum. mild chronic inflammation on bx   ESOPHAGOGASTRODUODENOSCOPY N/A 11/17/2023   Procedure: EGD (ESOPHAGOGASTRODUODENOSCOPY);  Surgeon: Shaaron Lamar CHRISTELLA, MD;  Location: [facility];  Service: Endoscopy;  Laterality: N/A;   INNER EAR SURGERY     ear drum   LAPAROTOMY  11/2002   Excision of ovarian remnant   THYROIDECTOMY  10/2006   Neg for cancer per patient   TOTAL ABDOMINAL HYSTERECTOMY W/ BILATERAL SALPINGOOPHORECTOMY  09/2001.    Home Medications:  Allergies as of 05/24/2024   No Known Allergies      Medication List        Accurate as of May 24, 2024 11:21 AM. If you have any questions, ask your nurse or doctor.          ALPRAZolam  0.5 MG tablet Commonly known as: XANAX  Take 0.5 mg by mouth 4 (four) times daily as needed.   amLODipine  5 MG tablet Commonly known as: NORVASC  Take 1  tablet (5 mg total) by mouth daily.   aspirin  EC 81 MG tablet Take 81 mg by mouth daily.   atorvastatin  80 MG tablet Commonly known as: LIPITOR Take 1 tablet (80 mg total) by mouth at bedtime.   buPROPion 150 MG 24 hr tablet Commonly known as: WELLBUTRIN XL Take 150 mg by mouth daily.   cetirizine 10 MG tablet Commonly known as: ZYRTEC Take 10 mg by mouth at bedtime.   cholecalciferol 25 MCG (1000 UNIT) tablet Commonly known as: VITAMIN D3 Take 1,000 Units by mouth daily.   clobetasol  ointment 0.05 % Commonly known as: TEMOVATE  USE TWICE A DAY FOR TWO  WEEKS THEN TWO TIMES WEEKLY   cyanocobalamin  1000 MCG tablet Commonly known as: VITAMIN B12 Take 1,000 mcg by mouth daily.   escitalopram  20 MG tablet Commonly known as: LEXAPRO  Take 1 tablet (20 mg total) by mouth daily.   estradiol  0.1 MG/GM vaginal cream Commonly known as: ESTRACE  INSERT A BLUEBERRY SIZED AMOUNT OF CREAM (1GM) ON FINGERTIP INSIDE OF VAGINA AT BEDTIME EVERY NIGHT   ezetimibe  10 MG tablet Commonly known as: ZETIA  Take 1 tablet (10 mg total) by mouth daily.   levothyroxine  100 MCG tablet Commonly known as: SYNTHROID  Take 1 tablet (100 mcg total) by mouth daily.   mirtazapine  30 MG tablet Commonly known as: REMERON  Take 1 tablet (30 mg total) by mouth daily.   multivitamin tablet Take 1 tablet by mouth daily.   olmesartan -hydrochlorothiazide  20-12.5 MG tablet Commonly known as: BENICAR  HCT Take 1 tablet by mouth daily.   pantoprazole  40 MG tablet Commonly known as: PROTONIX  Take 1 tablet (40 mg total) by mouth daily.   trimethoprim  100 MG tablet Commonly known as: TRIMPEX  Take 1 tablet (100 mg total) by mouth daily.   vitamin C  100 MG tablet Take 100 mg by mouth daily.   vitamin E  200 UNIT capsule Take 200 Units by mouth daily.        Allergies: No Known Allergies  Family History: Family History  Problem Relation Age of Onset   Heart disease Mother        CHF   Cancer Father        prostate   Stroke Brother    Stroke Sister    Heart disease Sister        CHF   Multiple sclerosis Sister    Stroke Daughter        light strokes   Heart attack Sister    Colon cancer Neg Hx     Social History:  reports that she has never smoked. She has never used smokeless tobacco. She reports that she does not drink alcohol and does not use drugs.  ROS: All other review of systems were reviewed and are negative except what is noted above in HPI  Physical Exam: BP 134/77   Pulse 96   Constitutional:  Alert and oriented, No acute  distress. HEENT: [place] AT, moist mucus membranes.  Trachea midline, no masses. Cardiovascular: No clubbing, cyanosis, or edema. Respiratory: Normal respiratory effort, no increased work of breathing. GI: Abdomen is soft, nontender, nondistended, no abdominal masses GU: No CVA tenderness.  Lymph: No cervical or inguinal lymphadenopathy. Skin: No rashes, bruises or suspicious lesions. Neurologic: Grossly intact, no focal deficits, moving all 4 extremities. Psychiatric: Normal mood and affect.  Laboratory Data: Lab Results  Component Value Date   WBC 7.0 11/04/2022   HGB 16.0 (H) 11/04/2022   HCT 47.0 (H) 11/04/2022   MCV 91.2 11/04/2022  PLT  11/04/2022    PLATELET CLUMPS NOTED ON SMEAR, COUNT APPEARS DECREASED    Lab Results  Component Value Date   CREATININE 0.60 11/04/2022    No results found for: PSA  No results found for: TESTOSTERONE  Lab Results  Component Value Date   HGBA1C 5.6 10/29/2011    Urinalysis    Component Value Date/Time   COLORURINE YELLOW 11/04/2022 1806   APPEARANCEUR Clear 09/21/2023 0924   LABSPEC 1.013 11/04/2022 1806   PHURINE 5.0 11/04/2022 1806   GLUCOSEU Negative 09/21/2023 0924   HGBUR NEGATIVE 11/04/2022 1806   BILIRUBINUR Negative 09/21/2023 0924   KETONESUR NEGATIVE 11/04/2022 1806   PROTEINUR Negative 09/21/2023 0924   PROTEINUR NEGATIVE 11/04/2022 1806   NITRITE Negative 09/21/2023 0924   NITRITE NEGATIVE 11/04/2022 1806   LEUKOCYTESUR 3+ (A) 09/21/2023 0924   LEUKOCYTESUR MODERATE (A) 11/04/2022 1806    Lab Results  Component Value Date   LABMICR See below: 09/21/2023   WBCUA >30 (A) 09/21/2023   LABEPIT 0-10 09/21/2023   BACTERIA Few (A) 09/21/2023    Pertinent Imaging:  No results found for this or any previous visit.  No results found for this or any previous visit.  No results found for this or any previous visit.  No results found for this or any previous visit.  Results for orders placed during the  hospital encounter of 09/25/13  US  Renal  Narrative CLINICAL DATA:  Incontinence  EXAM: RENAL/URINARY TRACT ULTRASOUND COMPLETE  COMPARISON:  None.  FINDINGS: Right Kidney:  Length: 11.1 cm.  No mass or hydronephrosis.  Left Kidney:  Length: 10.7 cm.  No mass or hydronephrosis.  Bladder:  Underdistended but unremarkable.  IMPRESSION: Negative renal ultrasound.   Electronically Signed By: Pinkie Pebbles M.D. On: 09/25/2013 14:53  No results found for this or any previous visit.  No results found for this or any previous visit.  No results found for this or any previous visit.   Assessment & Plan:    1. OAB (overactive bladder) (Primary) -we will trial mirabegron  25mg  daily - Urinalysis, Routine w reflex microscopic  2. Recurrent UTI Continue trimethoprim  100mg  daily.    No follow-ups on file.  Belvie Clara, MD  [facility] [place]

## 2024-06-07 DIAGNOSIS — E538 Deficiency of other specified B group vitamins: Secondary | ICD-10-CM | POA: Diagnosis not present

## 2024-06-07 DIAGNOSIS — M797 Fibromyalgia: Secondary | ICD-10-CM | POA: Diagnosis not present

## 2024-06-07 DIAGNOSIS — E785 Hyperlipidemia, unspecified: Secondary | ICD-10-CM | POA: Diagnosis not present

## 2024-06-07 DIAGNOSIS — R7303 Prediabetes: Secondary | ICD-10-CM | POA: Diagnosis not present

## 2024-06-07 DIAGNOSIS — E039 Hypothyroidism, unspecified: Secondary | ICD-10-CM | POA: Diagnosis not present

## 2024-06-07 DIAGNOSIS — E559 Vitamin D deficiency, unspecified: Secondary | ICD-10-CM | POA: Diagnosis not present

## 2024-06-27 ENCOUNTER — Telehealth: Payer: Self-pay | Admitting: Urology

## 2024-06-27 DIAGNOSIS — N3281 Overactive bladder: Secondary | ICD-10-CM

## 2024-06-27 NOTE — Telephone Encounter (Signed)
 Return call to patient that state's that Myrbetriq  has help some with her OAB. Pt state's she was told from Dr. Sherrilee  that Myrbetriq  would help her chronic back pain and OAB. Pt is advised Myrbetriq  is prescribe for OAB and not back pain. Pt is aware a message will be sent to Dr. Sherrilee on advisement for her back pain.

## 2024-06-27 NOTE — Telephone Encounter (Signed)
 Patient states Myrbetriq  has helped some but has not helped her back pain. McKenzie told her to call after trying it for 30 days

## 2024-07-11 MED ORDER — MIRABEGRON ER 50 MG PO TB24: 50.0000 mg | ORAL_TABLET | Freq: Every day | ORAL | 11 refills | Status: AC

## 2024-07-11 NOTE — Telephone Encounter (Signed)
 Pt was made aware and voiced understanding Increase to 50mg 

## 2024-11-01 ENCOUNTER — Ambulatory Visit: Admitting: Urology
